# Patient Record
Sex: Female | Born: 1965 | Race: White | Hispanic: No | Smoking: Former smoker
Health system: Southern US, Community
[De-identification: ages and names within clinical notes are randomized; demographics above are authoritative.]

## PROBLEM LIST (undated history)

## (undated) DIAGNOSIS — Z87442 Personal history of urinary calculi: Secondary | ICD-10-CM

## (undated) DIAGNOSIS — G4730 Sleep apnea, unspecified: Secondary | ICD-10-CM

## (undated) DIAGNOSIS — I10 Essential (primary) hypertension: Secondary | ICD-10-CM

## (undated) DIAGNOSIS — N189 Chronic kidney disease, unspecified: Secondary | ICD-10-CM

## (undated) DIAGNOSIS — F419 Anxiety disorder, unspecified: Secondary | ICD-10-CM

## (undated) DIAGNOSIS — M1990 Unspecified osteoarthritis, unspecified site: Secondary | ICD-10-CM

## (undated) DIAGNOSIS — R519 Headache, unspecified: Secondary | ICD-10-CM

## (undated) DIAGNOSIS — R5383 Other fatigue: Secondary | ICD-10-CM

## (undated) DIAGNOSIS — R7303 Prediabetes: Secondary | ICD-10-CM

## (undated) HISTORY — PX: CHOLECYSTECTOMY: SHX55

## (undated) HISTORY — PX: OTHER SURGICAL HISTORY: SHX169

## (undated) HISTORY — DX: Anxiety disorder, unspecified: F41.9

## (undated) HISTORY — DX: Other fatigue: R53.83

## (undated) HISTORY — PX: TUBAL LIGATION: SHX77

## (undated) HISTORY — PX: LAPAROSCOPIC HYSTERECTOMY: SHX1926

## (undated) HISTORY — DX: Headache, unspecified: R51.9

## (undated) HISTORY — PX: TONSILLECTOMY: SUR1361

---

## 2021-03-14 ENCOUNTER — Ambulatory Visit (INDEPENDENT_AMBULATORY_CARE_PROVIDER_SITE_OTHER): Payer: Medicaid Other

## 2021-03-14 ENCOUNTER — Ambulatory Visit (INDEPENDENT_AMBULATORY_CARE_PROVIDER_SITE_OTHER): Payer: Medicaid Other | Admitting: Sports Medicine

## 2021-03-14 ENCOUNTER — Other Ambulatory Visit: Payer: Self-pay

## 2021-03-14 DIAGNOSIS — M7542 Impingement syndrome of left shoulder: Secondary | ICD-10-CM | POA: Diagnosis not present

## 2021-03-14 DIAGNOSIS — Z96612 Presence of left artificial shoulder joint: Secondary | ICD-10-CM | POA: Insufficient documentation

## 2021-03-14 DIAGNOSIS — M170 Bilateral primary osteoarthritis of knee: Secondary | ICD-10-CM

## 2021-03-14 NOTE — Progress Notes (Signed)
    Procedures performed today:    Procedure: Real-time Ultrasound Guided injection of the left subacromial bursa Device: Samsung HS60  Verbal informed consent obtained.  Time-out conducted.  Noted no overlying erythema, induration, or other signs of local infection.  Skin prepped in a sterile fashion.  Local anesthesia: Topical Ethyl chloride.  With sterile technique and under real time ultrasound guidance:  Noted intact cuff, 1 cc Kenalog 40, 1 cc lidocaine, 1 cc bupivacaine injected easily Completed without difficulty  Advised to call if fevers/chills, erythema, induration, drainage, or persistent bleeding.  Images permanently stored and available for review in PACS.  Impression: Technically successful ultrasound guided injection.  Independent interpretation of notes and tests performed by another provider:   None.  Brief History, Exam, Impression, and Recommendations:    Impingement syndrome, shoulder, left This is a pleasant 55 year old female referred to me by Dr. Nedra Hai at [facility] here in [place], she has had a month and a half or so pain in her left shoulder, localized over the deltoid and worse with overhead activities, positive impingement signs on exam today. Getting some x-rays, because she is having nocturnal symptoms we did a subacromial injection with ultrasound guidance today, adding aggressive formal physical therapy in [place] where she lives, return to see me in 6 weeks, MRI for surgical planning if no better.  Primary osteoarthritis of both knees [name] has bilateral knee osteoarthritis, she is post partial meniscectomy. She has had some steroid injections in the past by orthopedic surgery without significant improvement. It does not sound like she has had viscosupplementation/hyaluronic injections, at the follow-up visit we will consider getting her approved for this as a bridge to  arthroplasty.    ___________________________________________ Patricia Carrillo. Patricia Carrillo, M.D., ABFM., CAQSM. Primary Care and Sports Medicine [facility]  Adjunct Instructor of Family Medicine  [employer]

## 2021-03-14 NOTE — Assessment & Plan Note (Signed)
This is a pleasant 55 year old female referred to me by Dr. Nedra Hai at [facility] here in [place], she has had a month and a half or so pain in her left shoulder, localized over the deltoid and worse with overhead activities, positive impingement signs on exam today. Getting some x-rays, because she is having nocturnal symptoms we did a subacromial injection with ultrasound guidance today, adding aggressive formal physical therapy in [place] where she lives, return to see me in 6 weeks, MRI for surgical planning if no better.

## 2021-03-14 NOTE — Assessment & Plan Note (Signed)
Patricia Carrillo has bilateral knee osteoarthritis, she is post partial meniscectomy. She has had some steroid injections in the past by orthopedic surgery without significant improvement. It does not sound like she has had viscosupplementation/hyaluronic injections, at the follow-up visit we will consider getting her approved for this as a bridge to arthroplasty.

## 2021-03-16 ENCOUNTER — Encounter: Payer: Self-pay | Admitting: Physical Therapy

## 2021-03-16 ENCOUNTER — Other Ambulatory Visit: Payer: Self-pay

## 2021-03-16 ENCOUNTER — Ambulatory Visit: Payer: Medicaid Other | Attending: Sports Medicine | Admitting: Physical Therapy

## 2021-03-16 DIAGNOSIS — M25512 Pain in left shoulder: Secondary | ICD-10-CM | POA: Diagnosis present

## 2021-03-16 NOTE — Therapy (Signed)
[facility] [address] [place], [place], [address] Phone: [phone]   Fax:  [phone]  Physical Therapy Evaluation  Patient Details  Name: Patricia Carrillo MRN: [id number] Date of Birth: 10/20/1955 Referring Provider (PT): Rodney Langton MD   Encounter Date: 03/16/2021   PT End of Session - 03/16/21 1322    Visit Number 1    Number of Visits 6    Date for PT Re-Evaluation 05/04/21    PT Start Time 0900    PT Stop Time 0926    PT Time Calculation (min) 26 min    Activity Tolerance Patient tolerated treatment well    Behavior During Therapy [facility] for tasks assessed/performed           Past Medical History:  Diagnosis Date  . Anxiety   . Fatigue   . Headache     Past Surgical History:  Procedure Laterality Date  . CHOLECYSTECTOMY    . LAPAROSCOPIC HYSTERECTOMY    . TONSILLECTOMY    . TUBAL LIGATION      There were no vitals filed for this visit.    Subjective Assessment - 03/16/21 1317    Subjective COVID-19 screen performed prior to patient entering clinic.  The presents to the clinic today with c/o left shoulder pain that came on for no apparent reason about a month ago.  Her pain-level today is a 6/10.  She reports a recent injection was very helpful and she can now raises her shoulder over her head.  Medication and heat can also help in decreasing her pain.  Moving her "wrong way" increases her pain.  She has had pain at night and cannot sleep comfortably on her left shoulder.    Pertinent History H/o bilateral knee pain.    Patient Stated Goals Use left UE without pain.    Currently in Pain? Yes    Pain Score 6     Pain Location Shoulder    Pain Orientation Left    Pain Descriptors / Indicators Aching;Throbbing    Pain Type Acute pain    Pain Onset More than a month ago    Pain Frequency Constant    Aggravating Factors  See above.    Pain Relieving Factors See above.              [facility] PT Assessment  - 03/16/21 0001      Assessment   Medical Diagnosis Impingement syndrome of left shoulder.    Referring Provider (PT) Rodney Langton MD    Onset Date/Surgical Date --   ~one month.     Precautions   Precautions None      Restrictions   Weight Bearing Restrictions No      Balance Screen   Has the patient fallen in the past 6 months Yes    How many times? 1.    Has the patient had a decrease in activity level because of a fear of falling?  No    Is the patient reluctant to leave their home because of a fear of falling?  No      Home Environment   Living Environment Private residence      Prior Function   Level of Independence Independent      Posture/Postural Control   Posture/Postural Control Postural limitations    Postural Limitations Rounded Shoulders;Forward head      Deep Tendon Reflexes   DTR Assessment Site Biceps;Brachioradialis;Triceps    Biceps DTR 2+    Brachioradialis DTR 2+  Triceps DTR 2+      ROM / Strength   AROM / PROM / Strength AROM;Strength      AROM   Overall AROM Comments Full active left shoulder range of motion.      Strength   Overall Strength Comments Left shoulder IR/ER is 4+/5.      Palpation   Palpation comment Mild left shoulder tendernss at acromial ridge region.      Special Tests   Other special tests (-) left Drop Arm test. (-) Impigment testing likely due to successful injection.                      Objective measurements completed on examination: See above findings.                    PT Long Term Goals - 03/16/21 1342      PT LONG TERM GOAL #1   Title Independent with a HEP.    Baseline No knowledge of appropriate ther ex.    Time 6    Period Weeks    Status New      PT LONG TERM GOAL #2   Title Perform ADL's with left shoulder pain not > 2/10.    Baseline Pain will rise above a 6/10 with the performance of ADL's.    Time 6    Period Weeks    Status New      PT LONG TERM GOAL  #3   Title Sleep undisturbed for 6 hours.    Baseline Patient's sleep is disturbed due to pain.    Time 6    Period Weeks    Status New                  Plan - 03/16/21 1329    Clinical Impression Statement The patient presents to OPPT with c/o left shoulder pain that has been ongoing for about a month.  A recent was very helpful and she exhibits full left active shoulder range of motion.  Impingement testing was negative today.  She is unable to sleep on her left side.  She has a minimal loss of left shoulder RTC strength.  Patient will benefit from skilled physical therapy intervention to address pain and deficits.    Personal Factors and Comorbidities Comorbidity 1;Other    Comorbidities H/o bilateral knee pain.    Examination-Activity Limitations Other;Sleep    Examination-Participation Restrictions Other    Stability/Clinical Decision Making Stable/Uncomplicated    Clinical Decision Making Low    Rehab Potential Excellent    PT Frequency 2x / week    PT Duration 6 weeks    PT Treatment/Interventions Therapeutic activities;Therapeutic exercise;Manual techniques    PT Next Visit Plan UBE, RW4, left shoulder PRE's.  STW/M.    Consulted and Agree with Plan of Care Patient           Patient will benefit from skilled therapeutic intervention in order to improve the following deficits and impairments:  Pain,Decreased strength,Decreased activity tolerance  Visit Diagnosis: Acute pain of left shoulder - Plan: PT plan of care cert/re-cert     Problem List Patient Active Problem List   Diagnosis Date Noted  . Impingement syndrome, shoulder, left 03/14/2021  . Primary osteoarthritis of both knees 03/14/2021    Patricia Carrillo, [place] MPT 03/16/2021, 1:46 PM  [facility] [address] [place], [place], [address] Phone: [phone]   Fax:  [phone]  Name: Patricia Carrillo MRN:  [id number] Date of Birth: 10/20/1955

## 2021-03-23 ENCOUNTER — Ambulatory Visit: Payer: Medicaid Other | Admitting: Physical Therapy

## 2021-03-23 ENCOUNTER — Other Ambulatory Visit: Payer: Self-pay

## 2021-03-23 DIAGNOSIS — M25512 Pain in left shoulder: Secondary | ICD-10-CM

## 2021-03-23 NOTE — Therapy (Signed)
[facility] [address] [place], [place], [address] Phone: [phone]   Fax:  [phone]  Physical Therapy Treatment  Patient Details  Name: Patricia Carrillo MRN: [id number] Date of Birth: 10/20/1955 Referring Provider (PT): Rodney Langton MD   Encounter Date: 03/23/2021   PT End of Session - 03/23/21 0903     Visit Number 2    Number of Visits 6    Date for PT Re-Evaluation 05/04/21    PT Start Time 0900    PT Stop Time 0944    PT Time Calculation (min) 44 min    Activity Tolerance Patient tolerated treatment well    Behavior During Therapy [facility] for tasks assessed/performed             Past Medical History:  Diagnosis Date   Anxiety    Fatigue    Headache     Past Surgical History:  Procedure Laterality Date   CHOLECYSTECTOMY     LAPAROSCOPIC HYSTERECTOMY     TONSILLECTOMY     TUBAL LIGATION      There were no vitals filed for this visit.   Subjective Assessment - 03/23/21 0901     Subjective COVID-19 screen performed prior to patient entering clinic.  Patient arrived with some ongoing pain yet shot has helped    Pertinent History H/o bilateral knee pain.    Patient Stated Goals Use left UE without pain.    Currently in Pain? Yes    Pain Score 4     Pain Location Shoulder    Pain Orientation Left    Pain Descriptors / Indicators Discomfort    Pain Type Acute pain    Pain Onset More than a month ago    Pain Frequency Constant    Aggravating Factors  unsure of trigger    Pain Relieving Factors rest and ice                               [facility] Adult PT Treatment/Exercise - 03/23/21 0001       Exercises   Exercises Shoulder      Shoulder Exercises: Supine   Other Supine Exercises seated yellow band for ER with FF 2x10      Shoulder Exercises: Seated   Row Strengthening;Both;20 reps;Theraband    Theraband Level (Shoulder Row) Level 2 (Red)    Other Seated Exercises chair press  3sec 2x10    Other Seated Exercises Bil scaption "V" 2x10      Shoulder Exercises: Sidelying   External Rotation Strengthening;Left;20 reps;Weights    External Rotation Weight (lbs) 1      Shoulder Exercises: Standing   Protraction Strengthening;Left;20 reps;Theraband    Theraband Level (Shoulder Protraction) Level 1 (Yellow)    External Rotation Strengthening;Left;20 reps;Theraband    Theraband Level (Shoulder External Rotation) Level 1 (Yellow)    Internal Rotation Strengthening;Left;20 reps;Theraband    Theraband Level (Shoulder Internal Rotation) Level 1 (Yellow)    Retraction Strengthening;Left;20 reps;Theraband    Theraband Level (Shoulder Retraction) Level 1 (Yellow)      Shoulder Exercises: ROM/Strengthening   UBE (Upper Arm Bike) 6 min 120 RPM 3/3    Wall Pushups 20 reps      Shoulder Exercises: Stretch   Corner Stretch 3 reps;20 seconds    Other Shoulder Stretches ant cap stretch 20sec hold x3reps  PT Long Term Goals - 03/23/21 0903       PT LONG TERM GOAL #1   Title Independent with a HEP.    Time 6    Period Weeks    Status On-going      PT LONG TERM GOAL #2   Title Perform ADL's with left shoulder pain not > 2/10.    Baseline Pain will rise above a 6/10 with the performance of ADL's.    Time 6    Period Weeks    Status On-going      PT LONG TERM GOAL #3   Title Sleep undisturbed for 6 hours.    Baseline Patient's sleep is disturbed due to pain.    Time 6    Period Weeks    Status On-going                   Plan - 03/23/21 0943     Clinical Impression Statement Patient tolerated treatment well today. Patient able to progress with PRE's and gentle stretches to improve stability and strength. patient reported no increased pain with exercises today. Patient has reported little pain after having shot in shoulder and unsure of any triggers that increase pain. Goals ongoing today. Will cont with POC and issue  HEP next week.    Personal Factors and Comorbidities Comorbidity 1;Other    Comorbidities H/o bilateral knee pain.    Examination-Activity Limitations Other;Sleep    Examination-Participation Restrictions Other    Stability/Clinical Decision Making Stable/Uncomplicated    Rehab Potential Excellent    PT Frequency 2x / week    PT Duration 6 weeks    PT Treatment/Interventions Therapeutic activities;Therapeutic exercise;Manual techniques    PT Next Visit Plan cont with POC for left shoulder PRE's.  STW/M.    Consulted and Agree with Plan of Care Patient             Patient will benefit from skilled therapeutic intervention in order to improve the following deficits and impairments:  Pain, Decreased strength, Decreased activity tolerance  Visit Diagnosis: Acute pain of left shoulder     Problem List Patient Active Problem List   Diagnosis Date Noted   Impingement syndrome, shoulder, left 03/14/2021   Primary osteoarthritis of both knees 03/14/2021    Vila Dory P, PTA 03/23/2021, 9:48 AM  [facility] [address] [place], [place], [address] Phone: [phone]   Fax:  [phone]  Name: Patricia Carrillo MRN: [id number] Date of Birth: 10/20/1955

## 2021-03-24 ENCOUNTER — Encounter: Payer: Self-pay | Admitting: Physical Therapy

## 2021-03-24 ENCOUNTER — Ambulatory Visit: Payer: Medicaid Other | Admitting: Physical Therapy

## 2021-03-24 DIAGNOSIS — M25512 Pain in left shoulder: Secondary | ICD-10-CM | POA: Diagnosis not present

## 2021-03-24 NOTE — Therapy (Signed)
[facility] [address] [place], [place], [address] Phone: [phone]   Fax:  [phone]  Physical Therapy Treatment  Patient Details  Name: Patricia Carrillo MRN: [id number] Date of Birth: 10/20/1955 Referring Provider (PT): Rodney Langton MD   Encounter Date: 03/24/2021   PT End of Session - 03/24/21 1348     Visit Number 3    Number of Visits 6    Date for PT Re-Evaluation 05/04/21    PT Start Time 1344    PT Stop Time 1430    PT Time Calculation (min) 46 min    Activity Tolerance Patient tolerated treatment well    Behavior During Therapy [facility] for tasks assessed/performed             Past Medical History:  Diagnosis Date   Anxiety    Fatigue    Headache     Past Surgical History:  Procedure Laterality Date   CHOLECYSTECTOMY     LAPAROSCOPIC HYSTERECTOMY     TONSILLECTOMY     TUBAL LIGATION      There were no vitals filed for this visit.   Subjective Assessment - 03/24/21 1346     Subjective COVID-19 screen performed prior to patient entering clinic. Reports minimal pain 2/10.    Pertinent History H/o bilateral knee pain.    Patient Stated Goals Use left UE without pain.    Currently in Pain? Yes    Pain Score 2     Pain Location Shoulder    Pain Orientation Left    Pain Descriptors / Indicators Discomfort    Pain Type Acute pain    Pain Onset More than a month ago    Pain Frequency Constant                [facility] PT Assessment - 03/24/21 0001       Assessment   Medical Diagnosis Impingement syndrome of left shoulder.    Referring Provider (PT) Rodney Langton MD    Next MD Visit 04/2021      Precautions   Precautions None      Restrictions   Weight Bearing Restrictions No                           [facility] Adult PT Treatment/Exercise - 03/24/21 0001       Shoulder Exercises: Standing   Protraction Strengthening;Left;20 reps;Theraband    Theraband Level (Shoulder  Protraction) Level 1 (Yellow)    Horizontal ABduction Strengthening;Both;20 reps;Theraband    Theraband Level (Shoulder Horizontal ABduction) Level 1 (Yellow)    External Rotation Strengthening;Left;20 reps;Theraband    Theraband Level (Shoulder External Rotation) Level 1 (Yellow)    Internal Rotation Strengthening;Left;20 reps;Theraband    Theraband Level (Shoulder Internal Rotation) Level 1 (Yellow)    Extension Strengthening;Left;20 reps;Theraband    Theraband Level (Shoulder Extension) Level 1 (Yellow)    Row Strengthening;Left;20 reps;Theraband    Theraband Level (Shoulder Row) Level 1 (Yellow)    Diagonals Strengthening;Left;20 reps;Theraband    Theraband Level (Shoulder Diagonals) Level 1 (Yellow)    Other Standing Exercises RUE wall slides in ER x20 reps      Shoulder Exercises: ROM/Strengthening   UBE (Upper Arm Bike) 90 RPM x6 min                    PT Education - 03/24/21 1441     Education Details [id number]- RW4 yellow theraband    Person(s) Educated Patient  Methods Explanation;Handout    Comprehension Verbalized understanding                 PT Long Term Goals - 03/24/21 1423       PT LONG TERM GOAL #1   Title Independent with a HEP.    Time 6    Period Weeks    Status On-going      PT LONG TERM GOAL #2   Title Perform ADL's with left shoulder pain not > 2/10.    Baseline Pain will rise above a 6/10 with the performance of ADL's.    Time 6    Period Weeks    Status Achieved      PT LONG TERM GOAL #3   Title Sleep undisturbed for 6 hours.    Baseline Patient's sleep is disturbed due to pain.    Time 6    Period Weeks    Status Achieved   Pain not due to shoulders; knees and back pain                  Plan - 03/24/21 1442     Clinical Impression Statement Patient presented in clinic with reports of minimal shoulder pain. Patient progressed into shoulder strengthening with no complaints of pain. Patient educated with ER of  UE to reduce further impingement of shoulder. Patient denies any limitations with functional ADLs at home for shoulder. Patient is awakened in sleep by pain but due to chronic LBP and knee pain only per patient report. Patient provided HEP to progress strengthening with yellow theraband. Patient provided education regarding parameters and technique.    Personal Factors and Comorbidities Comorbidity 1;Other    Comorbidities H/o bilateral knee pain.    Examination-Activity Limitations Other;Sleep    Examination-Participation Restrictions Other    Stability/Clinical Decision Making Stable/Uncomplicated    Rehab Potential Excellent    PT Frequency 2x / week    PT Duration 6 weeks    PT Treatment/Interventions Therapeutic activities;Therapeutic exercise;Manual techniques    PT Next Visit Plan cont with POC for left shoulder PRE's.  STW/M.    PT Home Exercise Plan [id number]    Recommended Other Services `    Consulted and Agree with Plan of Care Patient             Patient will benefit from skilled therapeutic intervention in order to improve the following deficits and impairments:  Pain, Decreased strength, Decreased activity tolerance  Visit Diagnosis: Acute pain of left shoulder     Problem List Patient Active Problem List   Diagnosis Date Noted   Impingement syndrome, shoulder, left 03/14/2021   Primary osteoarthritis of both knees 03/14/2021    Marvell Fuller, PTA 03/24/2021, 2:57 PM  [facility] [address] [place], [place], [address] Phone: [phone]   Fax:  [phone]  Name: Patricia Carrillo MRN: [id number] Date of Birth: 10/20/1955

## 2021-03-28 ENCOUNTER — Encounter: Payer: Self-pay | Admitting: Physical Therapy

## 2021-03-28 ENCOUNTER — Ambulatory Visit: Payer: Medicaid Other | Admitting: Physical Therapy

## 2021-03-28 ENCOUNTER — Other Ambulatory Visit: Payer: Self-pay

## 2021-03-28 DIAGNOSIS — M25512 Pain in left shoulder: Secondary | ICD-10-CM

## 2021-03-28 NOTE — Therapy (Signed)
[facility] [place], [place], [address] Phone: [phone]   Fax:  [phone]  Physical Therapy Treatment  Patient Details  Name: Patricia Carrillo MRN: [id number] Date of Birth: 10/20/1955 Referring Provider (PT): Aundria Mems MD   Encounter Date: 03/28/2021   PT End of Session - 03/28/21 1434     Visit Number 4    Number of Visits 6    Date for PT Re-Evaluation 05/04/21    PT Start Time 8341    PT Stop Time 1522    PT Time Calculation (min) 50 min    Activity Tolerance Patient tolerated treatment well    Behavior During Therapy [facility] for tasks assessed/performed             Past Medical History:  Diagnosis Date   Anxiety    Fatigue    Headache     Past Surgical History:  Procedure Laterality Date   CHOLECYSTECTOMY     LAPAROSCOPIC HYSTERECTOMY     TONSILLECTOMY     TUBAL LIGATION      There were no vitals filed for this visit.   Subjective Assessment - 03/28/21 1433     Subjective COVID-19 screen performed prior to patient entering clinic. Reports no shoulder discomfort or dysfunction today. Overwise reporting like she felt more pain in general for unknown reason.    Pertinent History H/o bilateral knee pain.    Patient Stated Goals Use left UE without pain.    Currently in Pain? No/denies                [facility] PT Assessment - 03/28/21 0001       Assessment   Medical Diagnosis Impingement syndrome of left shoulder.    Referring Provider (PT) Aundria Mems MD    Next MD Visit 04/2021      Precautions   Precautions None                           [facility] Adult PT Treatment/Exercise - 03/28/21 0001       Shoulder Exercises: Seated   Horizontal ABduction Strengthening;Both;Theraband;20 reps    Theraband Level (Shoulder Horizontal ABduction) Level 2 (Red)    Flexion Strengthening;Left;20 reps;Weights    Flexion Weight (lbs) 2    Abduction Strengthening     ABduction Weight (lbs) 2    Diagonals Strengthening;Left;20 reps;Theraband    Theraband Level (Shoulder Diagonals) Level 2 (Red)    Other Seated Exercises L shoulder scaption 2# x20 reps      Shoulder Exercises: Standing   Protraction Strengthening;Left;20 reps;Theraband    Theraband Level (Shoulder Protraction) Level 2 (Red)    Horizontal ABduction Strengthening;Both;20 reps;Theraband    Theraband Level (Shoulder Horizontal ABduction) Level 2 (Red)    External Rotation Strengthening;Left;20 reps;Theraband    Theraband Level (Shoulder External Rotation) Level 2 (Red)    Internal Rotation Strengthening;Left;20 reps;Theraband    Theraband Level (Shoulder Internal Rotation) Level 2 (Red)    Row Strengthening;Left;20 reps;Theraband    Theraband Level (Shoulder Row) Level 2 (Red)      Shoulder Exercises: ROM/Strengthening   UBE (Upper Arm Bike) 90 RPM x6 min                    PT Education - 03/28/21 1526     Education Details TENS unit education    Person(s) Educated Patient    Methods Explanation;Demonstration    Comprehension Verbalized understanding  PT Long Term Goals - 03/28/21 1507       PT LONG TERM GOAL #1   Title Independent with a HEP.    Time 6    Period Weeks    Status Achieved      PT LONG TERM GOAL #2   Title Perform ADL's with left shoulder pain not > 2/10.    Baseline Pain will rise above a 6/10 with the performance of ADL's.    Time 6    Period Weeks    Status Achieved      PT LONG TERM GOAL #3   Title Sleep undisturbed for 6 hours.    Baseline Patient's sleep is disturbed due to pain.    Time 6    Period Weeks    Status Achieved   Pain not due to shoulders; knees and back pain                  Plan - 03/28/21 1527     Clinical Impression Statement Patient presented in clinic with no complaints of any L shoulder pain. Patient does report other chronic pain in knees especially. Patient guided through  progressive strengthening with resistance. Patient denies any functional limitations due to L shoulder. Patient able to achieve all goals set at evaluation. Patient encouraged to continue HEP and educated regarding proper use of her TENS unit for parameters.    Personal Factors and Comorbidities Comorbidity 1;Other    Comorbidities H/o bilateral knee pain.    Examination-Activity Limitations Other;Sleep    Examination-Participation Restrictions Other    Stability/Clinical Decision Making Stable/Uncomplicated    Rehab Potential Excellent    PT Frequency 2x / week    PT Duration 6 weeks    PT Treatment/Interventions Therapeutic activities;Therapeutic exercise;Manual techniques    PT Next Visit Plan D/C    PT Home Exercise Plan [id number]    Consulted and Agree with Plan of Care Patient             Patient will benefit from skilled therapeutic intervention in order to improve the following deficits and impairments:  Pain, Decreased strength, Decreased activity tolerance  Visit Diagnosis: Acute pain of left shoulder     Problem List Patient Active Problem List   Diagnosis Date Noted   Impingement syndrome, shoulder, left 03/14/2021   Primary osteoarthritis of both knees 03/14/2021    Kelsey P Kennon, PTA 03/28/21 3:38 PM   [facility] [address] [place], [place], [address] Phone: [phone]   Fax:  [phone]  Name: Patricia Carrillo MRN: [id number] Date of Birth: 10/20/1955   PHYSICAL THERAPY DISCHARGE SUMMARY  Visits from Start of Care: 4.  Current functional level related to goals / functional outcomes: See above.   Remaining deficits: All goals met.   Education / Equipment: HEP.   Patient agrees to discharge. Patient goals were met. Patient is being discharged due to meeting the stated rehab goals.    Chad Applegate MPT   

## 2021-03-31 ENCOUNTER — Encounter: Payer: Medicaid Other | Admitting: Physical Therapy

## 2021-04-26 ENCOUNTER — Ambulatory Visit: Payer: Medicaid Other | Admitting: Sports Medicine

## 2021-07-25 ENCOUNTER — Encounter: Payer: Self-pay | Admitting: Internal Medicine

## 2021-10-19 ENCOUNTER — Ambulatory Visit (INDEPENDENT_AMBULATORY_CARE_PROVIDER_SITE_OTHER): Payer: Medicaid Other

## 2021-10-19 ENCOUNTER — Other Ambulatory Visit: Payer: Self-pay

## 2021-10-19 ENCOUNTER — Ambulatory Visit: Payer: Medicaid Other | Admitting: Sports Medicine

## 2021-10-19 DIAGNOSIS — M7542 Impingement syndrome of left shoulder: Secondary | ICD-10-CM

## 2021-10-19 NOTE — Progress Notes (Signed)
° ° °  Procedures performed today:    Procedure: Real-time Ultrasound Guided injection of the left subacromial bursa  device: Samsung HS60  Verbal informed consent obtained.  Time-out conducted.  Noted no overlying erythema, induration, or other signs of local infection.  Skin prepped in a sterile fashion.  Local anesthesia: Topical Ethyl chloride.  With sterile technique and under real time ultrasound guidance: Noted intact rotator cuff, 1 cc Kenalog 40, 1 cc lidocaine, 1 cc bupivacaine injected easily Completed without difficulty  Advised to call if fevers/chills, erythema, induration, drainage, or persistent bleeding.  Images permanently stored and available for review in PACS.  Impression: Technically successful ultrasound guided injection.  Independent interpretation of notes and tests performed by another provider:   None.  Brief History, Exam, Impression, and Recommendations:    Impingement syndrome, shoulder, left Pleasant 56 year old female, we saw her about 7 months ago for left shoulder impingement syndrome, she had failed some conservative treatment so we did a subacromial injection with ultrasound guidance, she had 100% pain relief. Now 6 months later she has not really been doing much of her conditioning, and is having recurrence of pain, impingement signs on exam. Repeat subacromial injection, and I did advise her to continue the conditioning indefinitely.  Chronic process with exacerbation and pharmacologic intervention  ___________________________________________ Gwen Her. Dianah Field, M.D., ABFM., CAQSM. Primary Care and [place] Instructor of [place] of [facility] of Medicine

## 2021-10-19 NOTE — Assessment & Plan Note (Signed)
Pleasant 56 year old female, we saw her about 7 months ago for left shoulder impingement syndrome, she had failed some conservative treatment so we did a subacromial injection with ultrasound guidance, she had 100% pain relief. Now 6 months later she has not really been doing much of her conditioning, and is having recurrence of pain, impingement signs on exam. Repeat subacromial injection, and I did advise her to continue the conditioning indefinitely.

## 2021-11-30 ENCOUNTER — Other Ambulatory Visit: Payer: Self-pay

## 2021-11-30 ENCOUNTER — Ambulatory Visit (INDEPENDENT_AMBULATORY_CARE_PROVIDER_SITE_OTHER): Payer: Medicaid Other

## 2021-11-30 ENCOUNTER — Ambulatory Visit: Payer: Medicaid Other | Admitting: Sports Medicine

## 2021-11-30 DIAGNOSIS — M5136 Other intervertebral disc degeneration, lumbar region: Secondary | ICD-10-CM

## 2021-11-30 DIAGNOSIS — M51369 Other intervertebral disc degeneration, lumbar region without mention of lumbar back pain or lower extremity pain: Secondary | ICD-10-CM | POA: Insufficient documentation

## 2021-11-30 DIAGNOSIS — M7542 Impingement syndrome of left shoulder: Secondary | ICD-10-CM | POA: Diagnosis not present

## 2021-11-30 IMAGING — DX DG LUMBAR SPINE COMPLETE 4+V
5 series · 5 of 5 positions shown · non-contrast
Comparison: None.

CLINICAL DATA: Chronic back pain.  No known injury.

EXAM:
LUMBAR SPINE - COMPLETE 4+ VIEW

[l-spine ap]
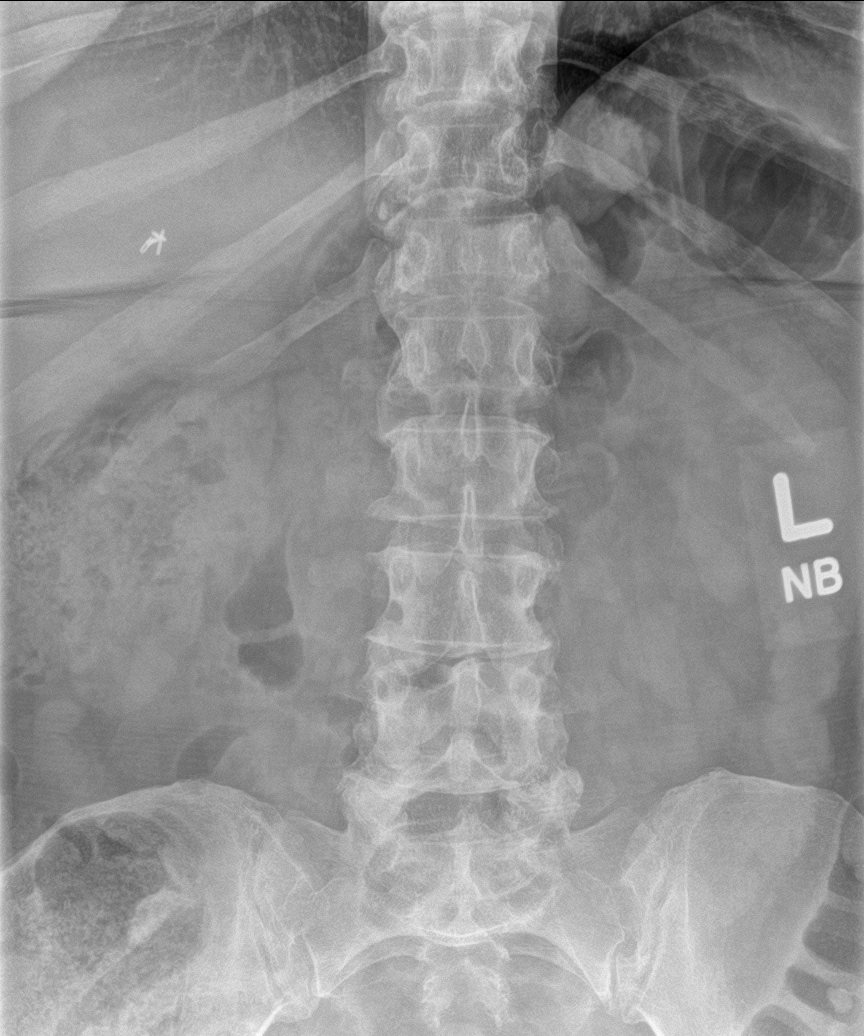

[l-spine obl (1 of 2)]
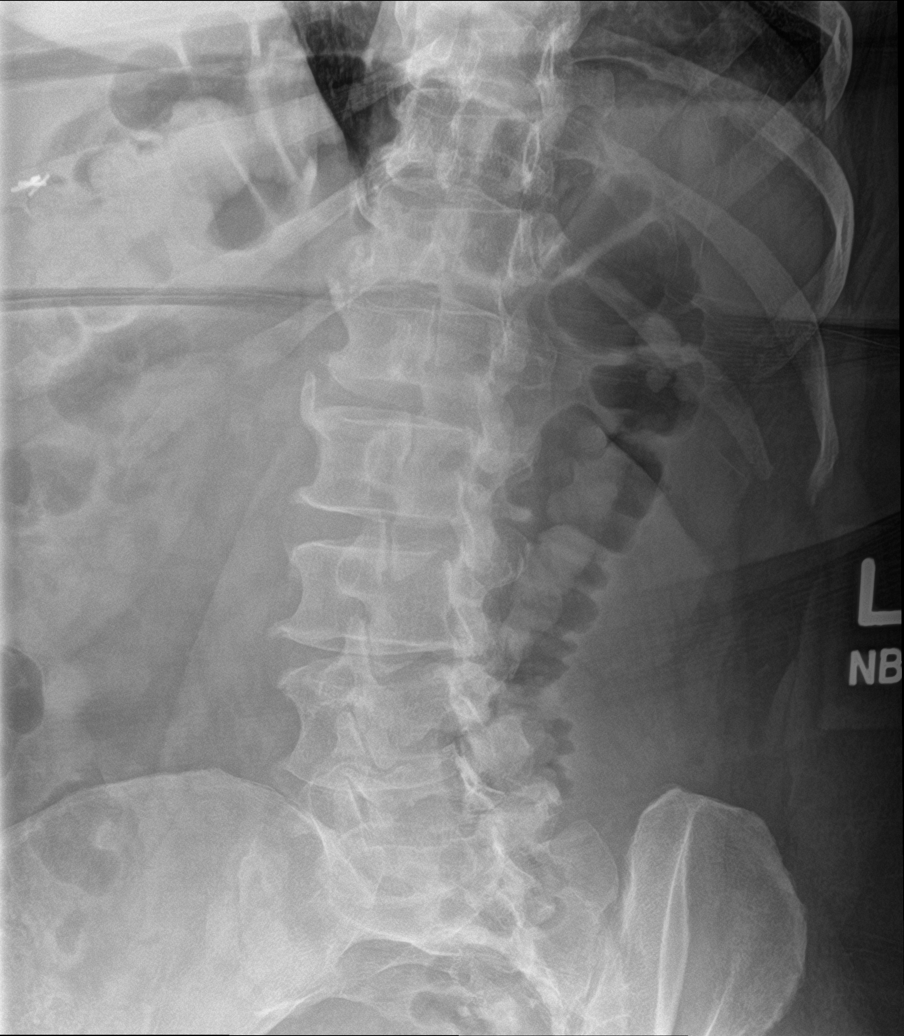

[l-spine obl (2 of 2)]
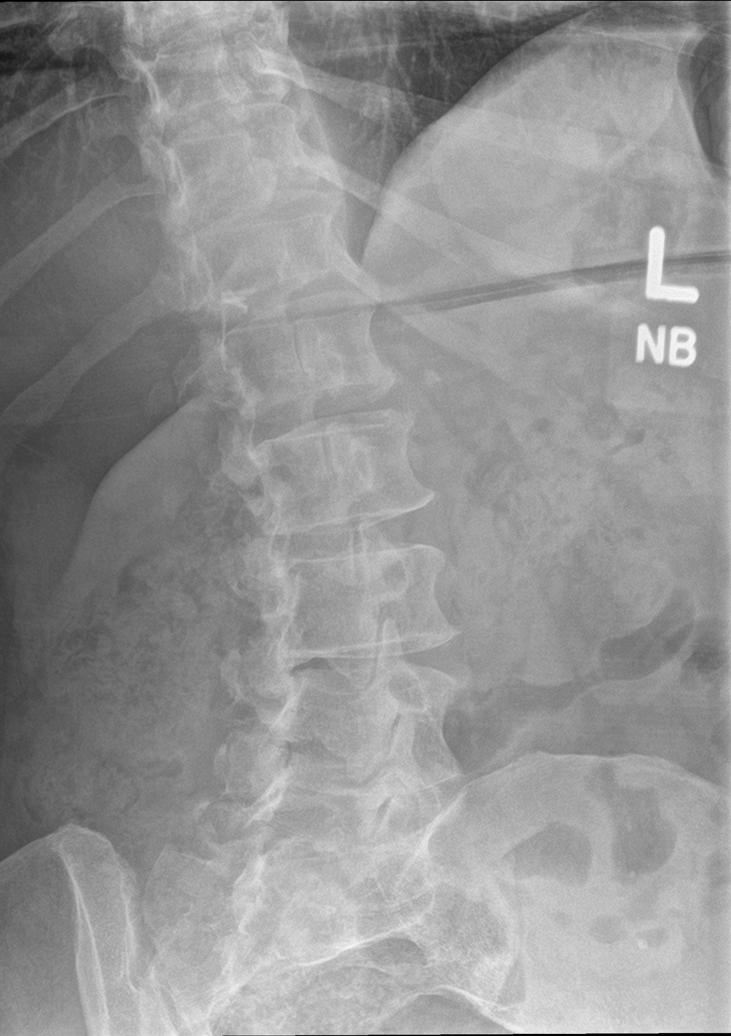

[l-spine lat]
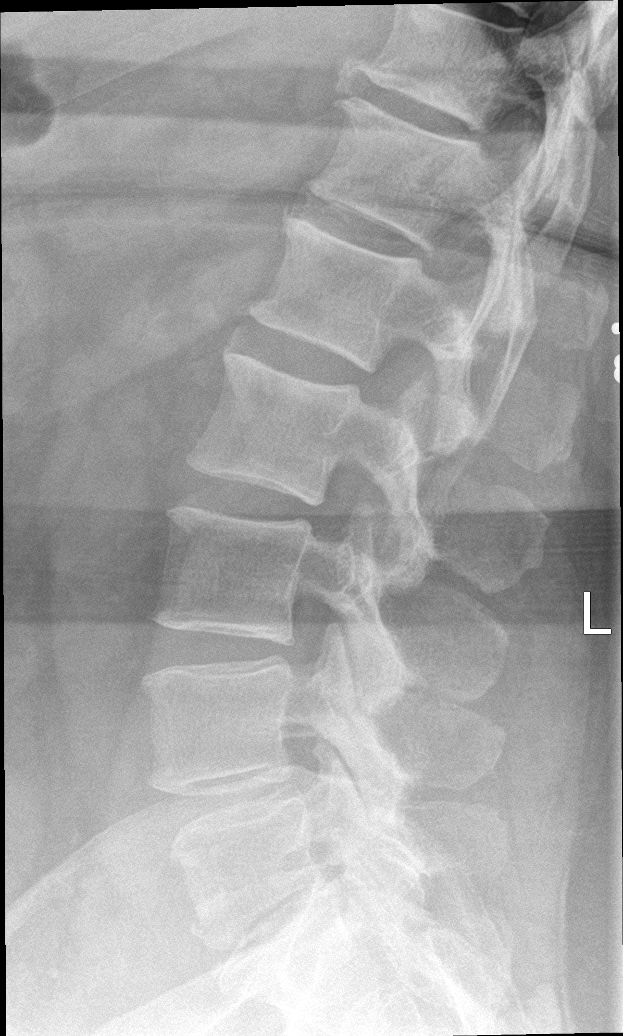

[l-spine spot]
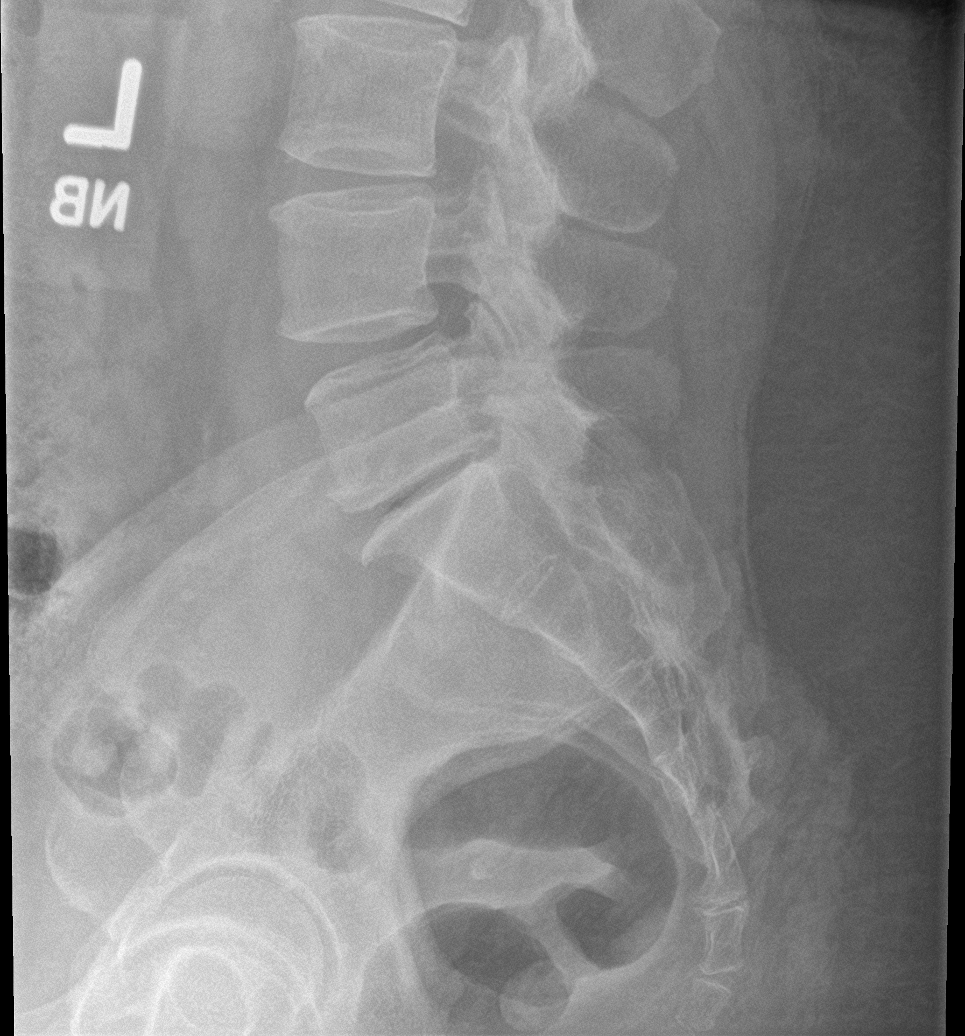

[5 of 5 positions shown; findings below may reference images not displayed]

FINDINGS: Five lumbar type vertebra. There is no acute fracture or subluxation
of the lumbar spine. There is degenerative changes with disc space
narrowing, disc desiccation and spurring at L5-S1. Mild spurring of
the anterior superior endplates at L1, L2, and L3. The visualized
posterior elements are intact. Mild lower lumbar facet arthropathy.
The soft tissues are unremarkable. Right upper quadrant
cholecystectomy clips.
IMPRESSION: 1. No acute findings.
2. Degenerative changes primarily at L5-S1.

## 2021-11-30 MED ORDER — CYCLOBENZAPRINE HCL 10 MG PO TABS: ORAL_TABLET | ORAL | 0 refills | Status: DC

## 2021-11-30 MED ORDER — PREDNISONE 50 MG PO TABS
ORAL_TABLET | ORAL | 0 refills | Status: DC
Start: 2021-11-30 — End: 2022-01-16

## 2021-11-30 NOTE — Progress Notes (Addendum)
° ° °  Procedures performed today:    None.  Independent interpretation of notes and tests performed by another provider:   None.  Brief History, Exam, Impression, and Recommendations:    Impingement syndrome, shoulder, left This pleasant 56 year old female returns, we did a subacromial injection at the last visit, she really did not get the same relief this time that she had previously. She has been consistent with the home conditioning exercises for greater than 6 weeks. She will get her x-ray today, and we will proceed with a left shoulder MRI.  Update: MRI shows cuff tearing, acromioclavicular osteoarthritis, I do suspect she needs arthroscopic debridement, referral to Dr. Griffin Basil.  Lumbar degenerative disc disease She also has longstanding low back pain, axial, discogenic with radiation down both legs below the knees to the feet and toes, she will be cognizant over the next 6 weeks as to exactly which toes it goes to and this will help me to determine which nerve is pinched in her back. No red flag symptoms, we will start with 5 days of prednisone, Flexeril, discontinue methocarbamol. X-rays. Home physical therapy. Return to see me in 6 weeks. She does have a pain management provider who does her oxycodone.    ___________________________________________ Gwen Her. Dianah Field, M.D., ABFM., CAQSM. Primary Care and [place] Instructor of [place] of [facility] of Medicine

## 2021-11-30 NOTE — Assessment & Plan Note (Addendum)
She also has longstanding low back pain, axial, discogenic with radiation down both legs below the knees to the feet and toes, she will be cognizant over the next 6 weeks as to exactly which toes it goes to and this will help me to determine which nerve is pinched in her back. No red flag symptoms, we will start with 5 days of prednisone, Flexeril, discontinue methocarbamol. X-rays. Home physical therapy. Return to see me in 6 weeks. She does have a pain management provider who does her oxycodone.

## 2021-11-30 NOTE — Assessment & Plan Note (Addendum)
This pleasant 56 year old female returns, we did a subacromial injection at the last visit, she really did not get the same relief this time that she had previously. She has been consistent with the home conditioning exercises for greater than 6 weeks. She will get her x-ray today, and we will proceed with a left shoulder MRI.  Update: MRI shows cuff tearing, acromioclavicular osteoarthritis, I do suspect she needs arthroscopic debridement, referral to Dr. Everardo Pacific.

## 2021-12-10 ENCOUNTER — Other Ambulatory Visit: Payer: Self-pay

## 2021-12-10 ENCOUNTER — Ambulatory Visit (INDEPENDENT_AMBULATORY_CARE_PROVIDER_SITE_OTHER): Payer: Medicaid Other

## 2021-12-10 DIAGNOSIS — M7542 Impingement syndrome of left shoulder: Secondary | ICD-10-CM | POA: Diagnosis not present

## 2021-12-10 IMAGING — MR MR SHOULDER*L* W/O CM
6 series · 40 of 40 positions shown · non-contrast
Comparison: Radiograph dated [DATE].

CLINICAL DATA: Shoulder pain, rotator cuff disorder suspected.

EXAM:
MRI OF THE LEFT SHOULDER WITHOUT CONTRAST
TECHNIQUE: Multiplanar, multisequence MR imaging of the shoulder was performed.
No intravenous contrast was administered.

[Series 4: T2 fat-sat · oblique · 4.0mm · 0.55mm/px · 7 of 20 slices shown (1 of 3)]
[im 1/20]
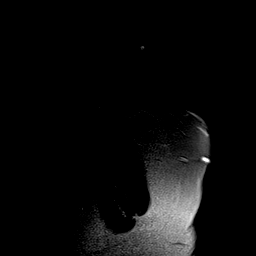
[im 4/20]
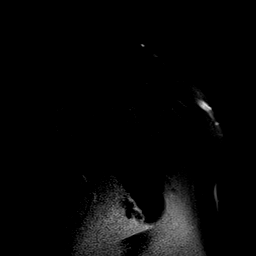
[im 7/20]
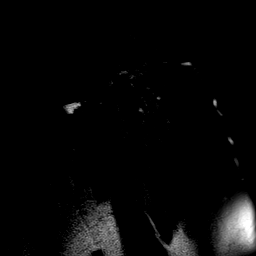
[im 10/20]
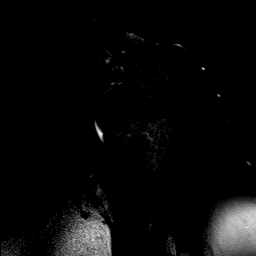
[im 13/20]
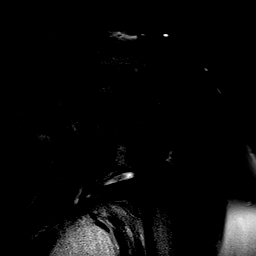
[im 16/20]
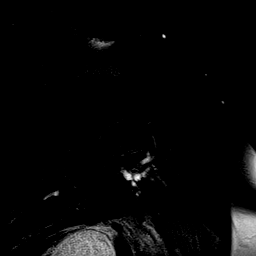
[im 20/20]
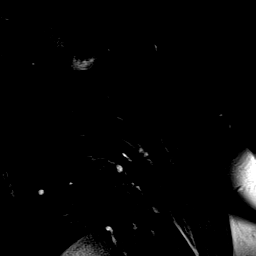

[Series 5: T2 fat-sat · axial · 4.0mm · 0.59mm/px · z∈[-97,-2]mm · 7 of 21 slices shown (2 of 3)]
[im 1/21]
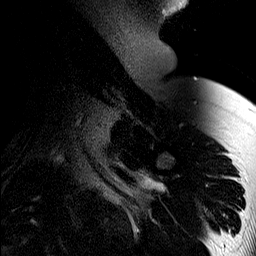
[im 4/21]
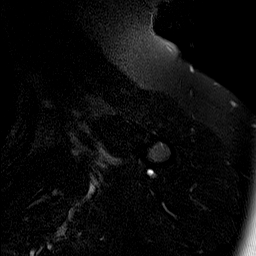
[im 7/21]
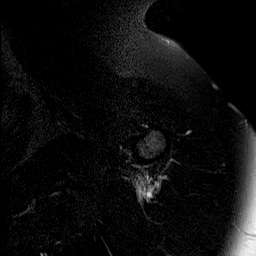
[im 11/21]
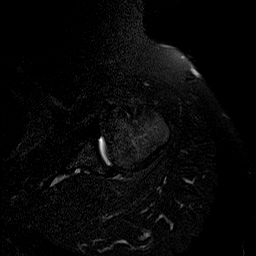
[im 14/21]
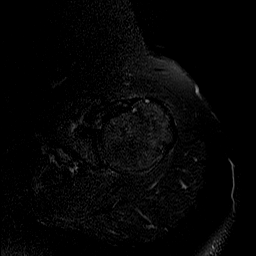
[im 17/21]
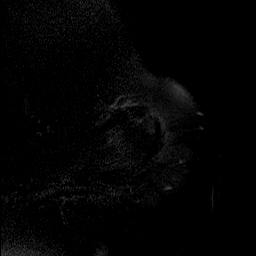
[im 21/21]
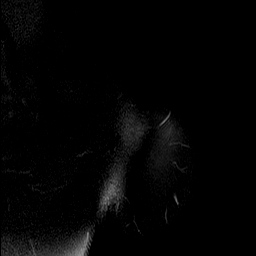

[Series 6: PD · oblique · 4.0mm · 0.55mm/px · 7 of 20 slices shown]
[im 1/20]
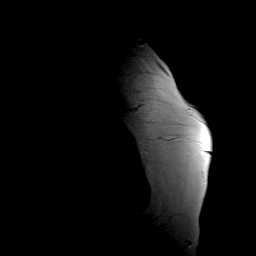
[im 4/20]
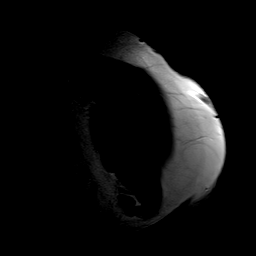
[im 7/20]
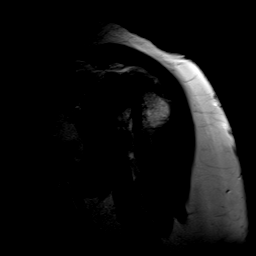
[im 10/20]
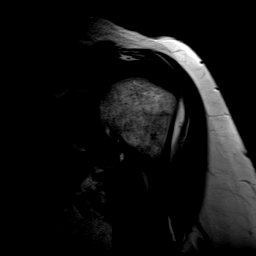
[im 13/20]
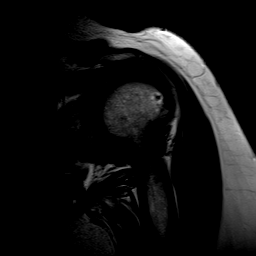
[im 16/20]
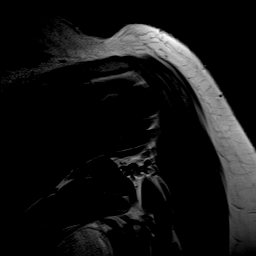
[im 20/20]
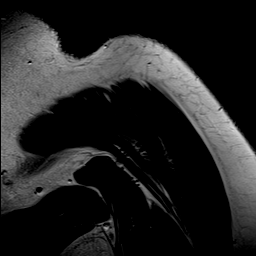

[Series 7: T1 · oblique · 4.0mm · 0.55mm/px · 6 of 18 slices shown]
[im 1/18]
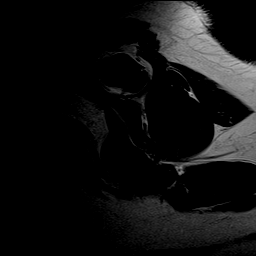
[im 4/18]
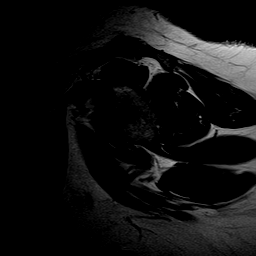
[im 7/18]
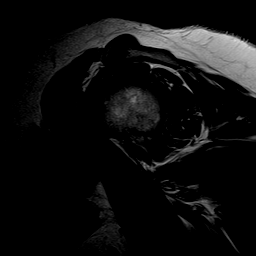
[im 11/18]
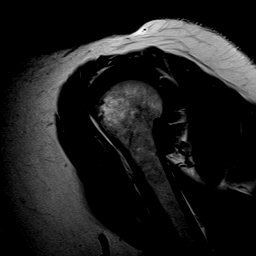
[im 14/18]
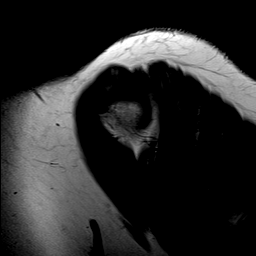
[im 18/18]
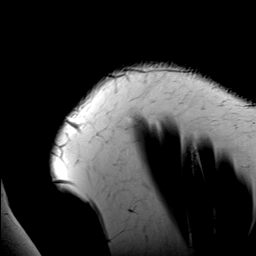

[Series 8: T2 fat-sat · oblique · 4.0mm · 0.59mm/px · 6 of 18 slices shown (3 of 3)]
[im 1/18]
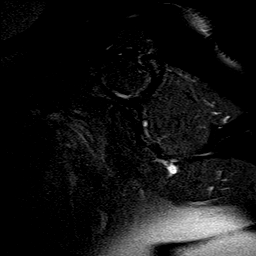
[im 4/18]
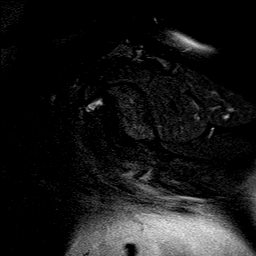
[im 7/18]
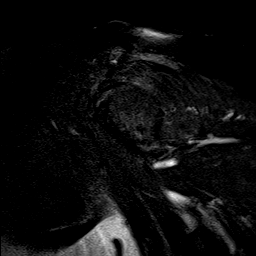
[im 11/18]
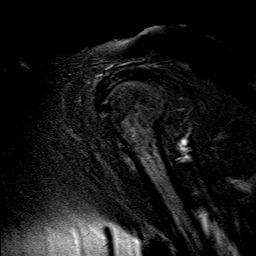
[im 14/18]
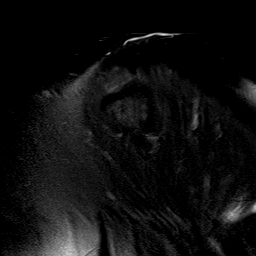
[im 18/18]
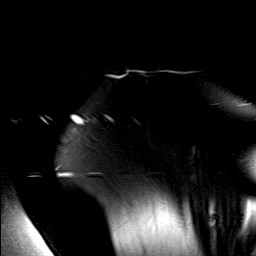

[Series 9: STIR · oblique · 4.0mm · 0.27mm/px · 7 of 20 slices shown]
[im 1/20]
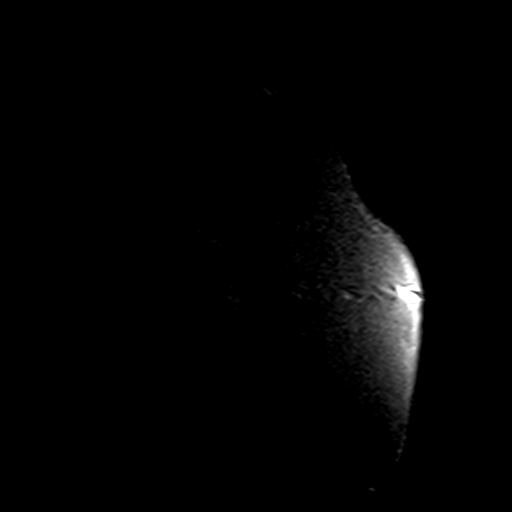
[im 4/20]
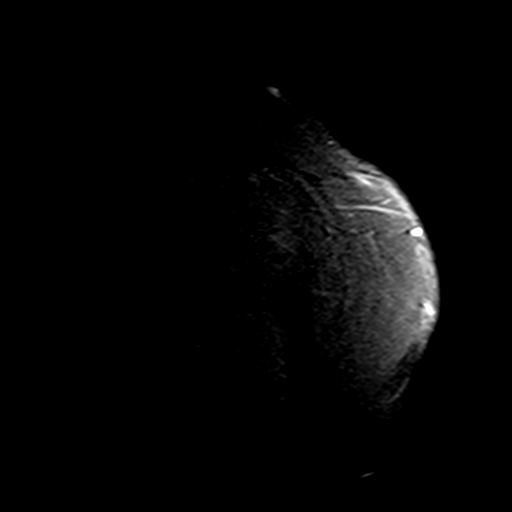
[im 7/20]
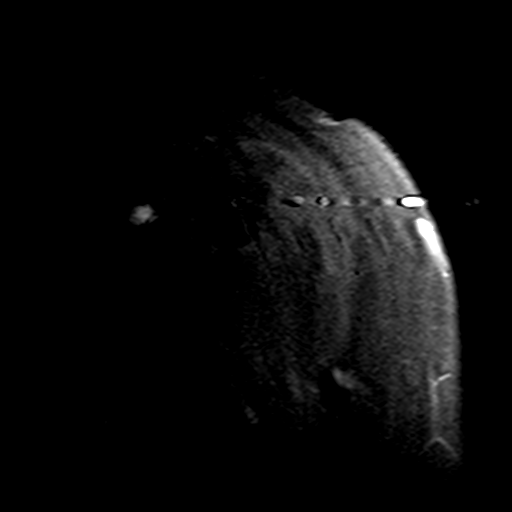
[im 10/20]
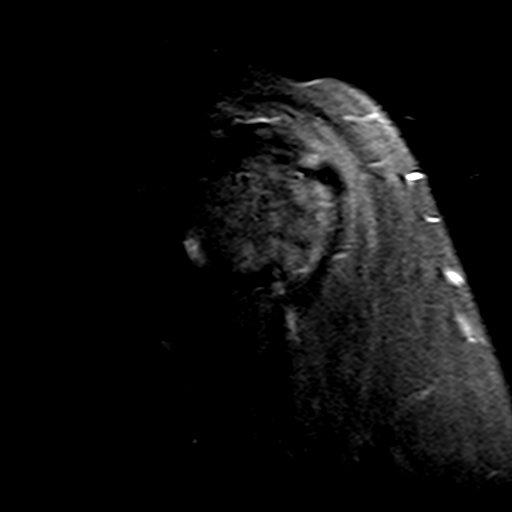
[im 13/20]
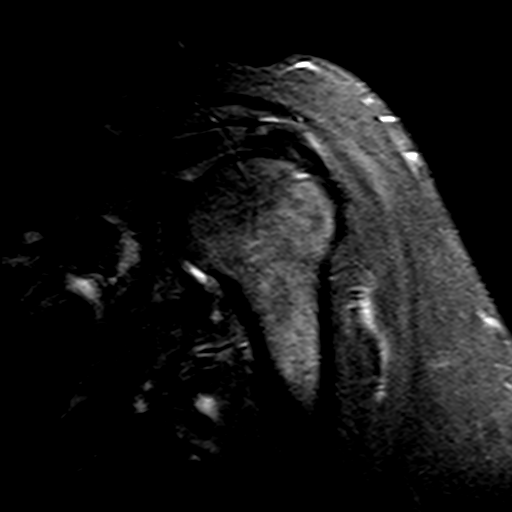
[im 16/20]
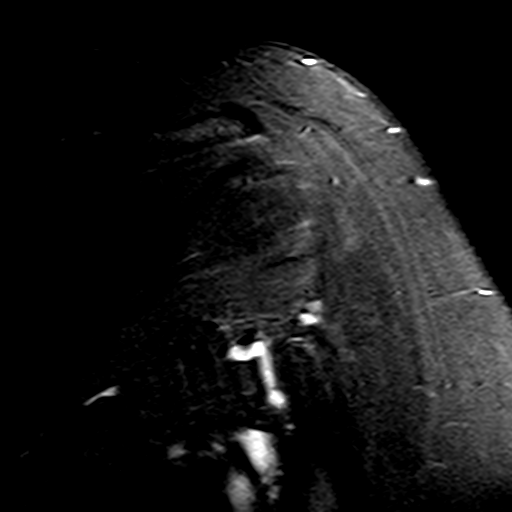
[im 20/20]
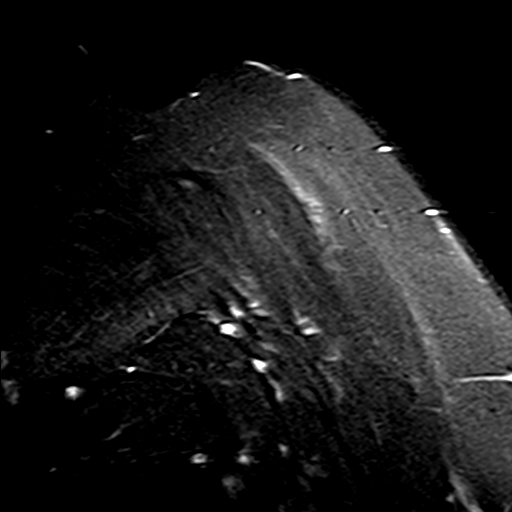

[40 of 40 positions shown; findings below may reference images not displayed]

FINDINGS: Multiple sequences are degraded due to motion.

Rotator cuff: Intermediate signal of the supraspinatus tendon
concerning for tendinopathy. Focal partial-thickness bursal surface
tear of the distal supraspinatus tendon (series 4, image 12).
Infraspinatus tendon is intact. Teres minor tendon is intact.
Subscapularis tendon is intact.

Muscles: No muscle atrophy or edema. No intramuscular fluid
collection or hematoma.

Biceps Long Head: Intraarticular and extraarticular portions of the
biceps tendon are intact.

Acromioclavicular Joint: Moderate arthropathy of the
acromioclavicular joint characterized by joint capsule thickening
and subacromial osteophytes. No subacromial/subdeltoid bursal fluid.

Glenohumeral Joint: No joint effusion. No chondral defect.

Labrum: Grossly intact, but evaluation is limited by lack of
intraarticular fluid/contrast.

Bones: No fracture or dislocation. No aggressive osseous lesion.

Other: No fluid collection or hematoma.
IMPRESSION: 1. Focal partial-thickness bursal surface tear of the supraspinatus
tendon near the footprint in the background of tendinopathy.

2. Moderate acromioclavicular osteoarthritis with subacromial
osteophytes.

3. No appreciable joint effusion of glenohumeral joint or
full-thickness cartilage defect. Evaluation is however suboptimal
due to motion.

4.  No evidence of fracture or dislocation.

## 2021-12-12 NOTE — Addendum Note (Signed)
Addended by: Silverio Decamp on: 12/12/2021 10:45 AM   Modules accepted: Orders

## 2021-12-14 ENCOUNTER — Ambulatory Visit: Payer: Medicaid Other | Admitting: Gastroenterology

## 2021-12-14 ENCOUNTER — Encounter: Payer: Self-pay | Admitting: Internal Medicine

## 2022-01-16 ENCOUNTER — Other Ambulatory Visit: Payer: Self-pay

## 2022-01-16 ENCOUNTER — Encounter (HOSPITAL_BASED_OUTPATIENT_CLINIC_OR_DEPARTMENT_OTHER): Payer: Self-pay | Admitting: Orthopaedic Surgery

## 2022-01-16 ENCOUNTER — Ambulatory Visit: Payer: Medicaid Other | Admitting: Sports Medicine

## 2022-01-16 DIAGNOSIS — M5136 Other intervertebral disc degeneration, lumbar region: Secondary | ICD-10-CM

## 2022-01-16 DIAGNOSIS — M7542 Impingement syndrome of left shoulder: Secondary | ICD-10-CM | POA: Diagnosis not present

## 2022-01-16 DIAGNOSIS — E669 Obesity, unspecified: Secondary | ICD-10-CM

## 2022-01-16 DIAGNOSIS — M51369 Other intervertebral disc degeneration, lumbar region without mention of lumbar back pain or lower extremity pain: Secondary | ICD-10-CM

## 2022-01-16 HISTORY — DX: Obesity, unspecified: E66.9

## 2022-01-16 MED ORDER — GABAPENTIN 800 MG PO TABS: 800.0000 mg | ORAL_TABLET | Freq: Two times a day (BID) | ORAL | 3 refills | Status: DC

## 2022-01-16 MED ORDER — TRIAZOLAM 0.25 MG PO TABS: ORAL_TABLET | ORAL | 0 refills | Status: DC

## 2022-01-16 NOTE — Progress Notes (Signed)
Pt reports she had a stress test today at [facility]. She will be seeing Dr Shanda Howells tomorrow to review results. She will ask them to fax the info to [place]. ?

## 2022-01-16 NOTE — Assessment & Plan Note (Signed)
See prior notes, scheduled for shoulder arthroscopy with Dr. Griffin Basil. ?

## 2022-01-16 NOTE — Progress Notes (Addendum)
? ? ?  Procedures performed today:   ? ?None. ? ?Independent interpretation of notes and tests performed by another provider:  ? ?None. ? ?Brief History, Exam, Impression, and Recommendations:   ? ?Lumbar degenerative disc disease ?Pleasant 56 year old female, longstanding low back pain, axial pain is discogenic, radicular pain down the left leg is in an L5 distribution. ?She has now had greater than 6 weeks of physician directed conservative treatment, initial treatment was February. ?She has failed 5 days of prednisone, Flexeril. ?X-rays showed L5-S1 DDD, proceeding with MRI with triazolam preprocedural anxiolysis, most recent creatinine in March was 1.2, we will bump her gabapentin up to 800 twice daily. ?As before she does have a pain management provider who does her oxycodone. ?Ultimately the plan will be to try the gabapentin and have her do some therapy for her back while she does her shoulder therapy before considering an epidural. ? ?Impingement syndrome, shoulder, left ?See prior notes, scheduled for shoulder arthroscopy with Dr. Everardo Pacific. ? ? ? ?___________________________________________ ?Ihor Austin. Benjamin Stain, M.D., ABFM., CAQSM. ?Primary Care and Sports Medicine ?[facility] ? ?Adjunct Instructor of Family Medicine  ?[employer] ?

## 2022-01-16 NOTE — Assessment & Plan Note (Addendum)
Pleasant 56 year old female, longstanding low back pain, axial pain is discogenic, radicular pain down the left leg is in an L5 distribution. ?She has now had greater than 6 weeks of physician directed conservative treatment, initial treatment was February. ?She has failed 5 days of prednisone, Flexeril. ?X-rays showed L5-S1 DDD, proceeding with MRI with triazolam preprocedural anxiolysis, most recent creatinine in March was 1.2, we will bump her gabapentin up to 800 twice daily. ?As before she does have a pain management provider who does her oxycodone. ?Ultimately the plan will be to try the gabapentin and have her do some therapy for her back while she does her shoulder therapy before considering an epidural. ?

## 2022-01-21 ENCOUNTER — Ambulatory Visit (INDEPENDENT_AMBULATORY_CARE_PROVIDER_SITE_OTHER): Payer: Medicaid Other

## 2022-01-21 DIAGNOSIS — M545 Low back pain, unspecified: Secondary | ICD-10-CM

## 2022-01-21 DIAGNOSIS — M5136 Other intervertebral disc degeneration, lumbar region: Secondary | ICD-10-CM | POA: Diagnosis not present

## 2022-01-21 IMAGING — MR MR LUMBAR SPINE W/O CM
4 of 5 series · 25 of 48 positions shown · non-contrast
Comparison: Radiograph from [DATE].

CLINICAL DATA: Initial evaluation for lower back pain with left
lumbar radiculitis.

EXAM:
MRI LUMBAR SPINE WITHOUT CONTRAST
TECHNIQUE: Multiplanar, multisequence MR imaging of the lumbar spine was
performed. No intravenous contrast was administered.

[Series 2: T2 · sagittal · 4.0mm · 0.81mm/px · 6 of 15 slices shown (1 of 2)]
[im 1/15]
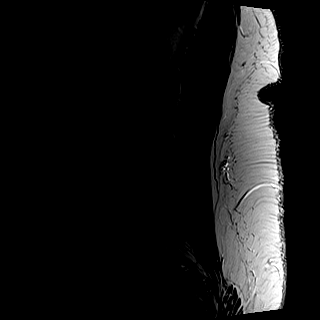
[im 3/15]
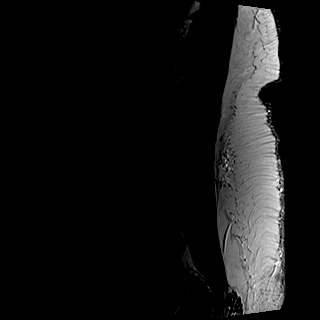
[im 6/15]
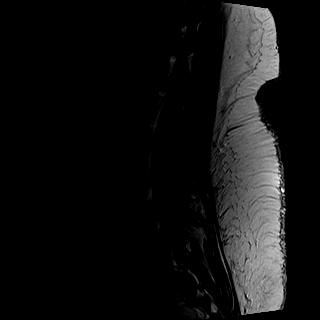
[im 9/15]
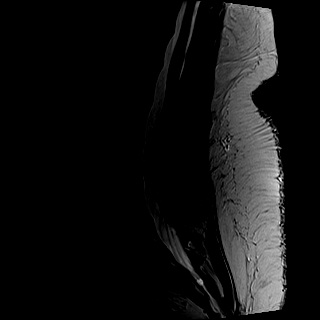
[im 12/15]
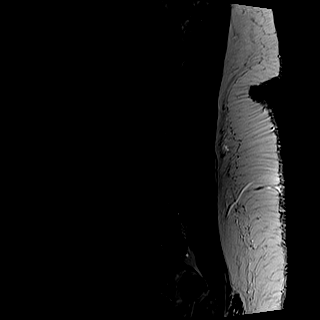
[im 15/15]
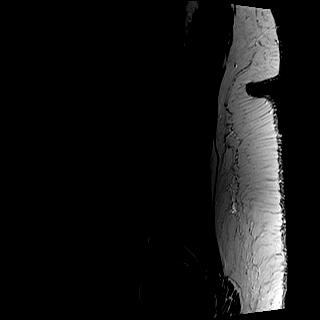

[Series 3: T1 · sagittal · 4.0mm · 0.41mm/px · 6 of 15 slices shown (1 of 2)]
[im 1/15]
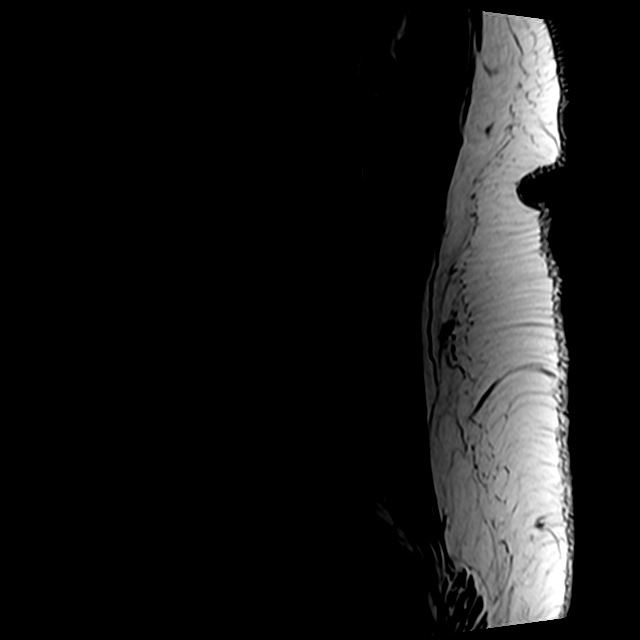
[im 3/15]
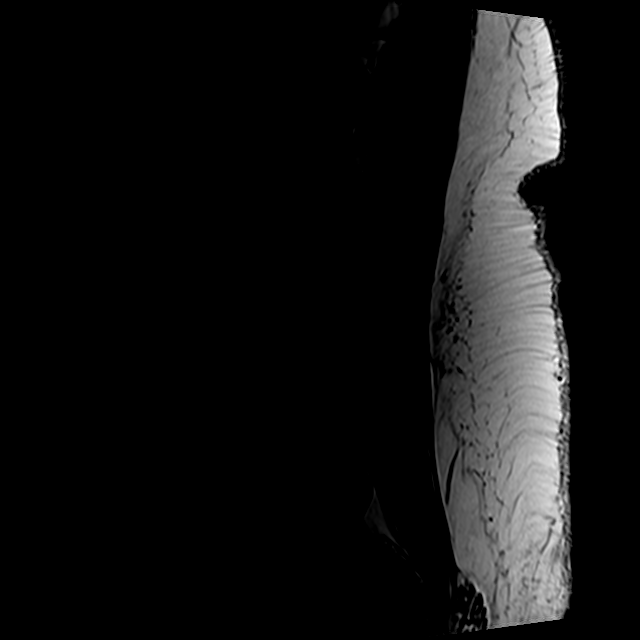
[im 6/15]
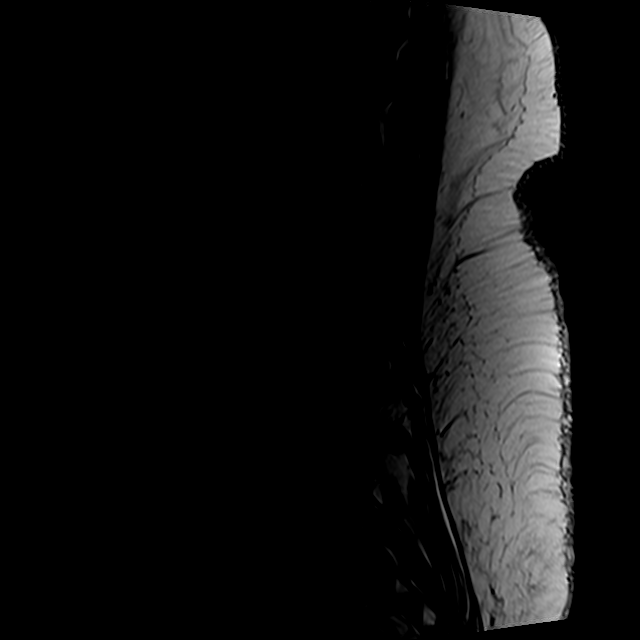
[im 9/15]
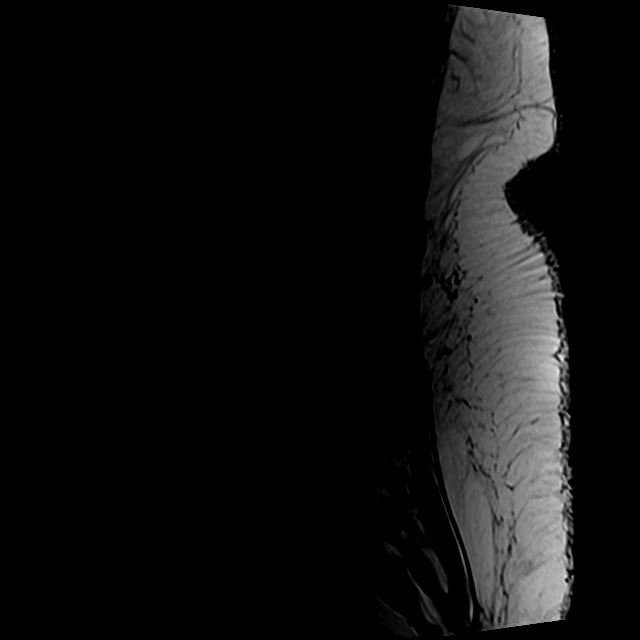
[im 12/15]
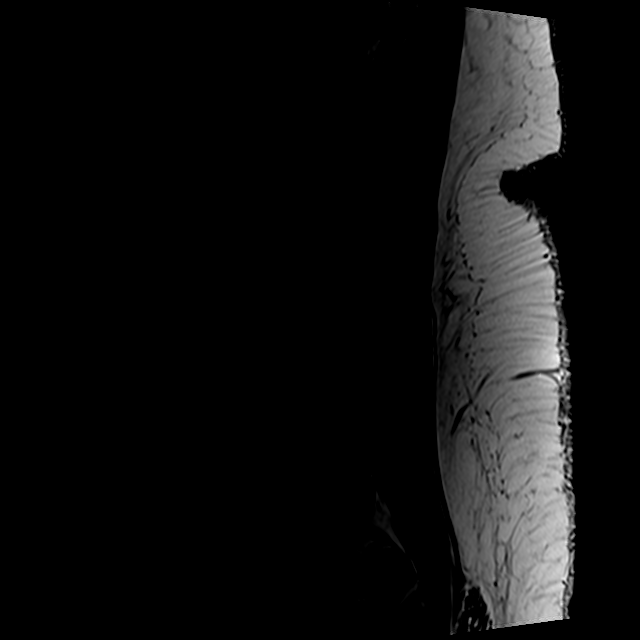
[im 15/15]
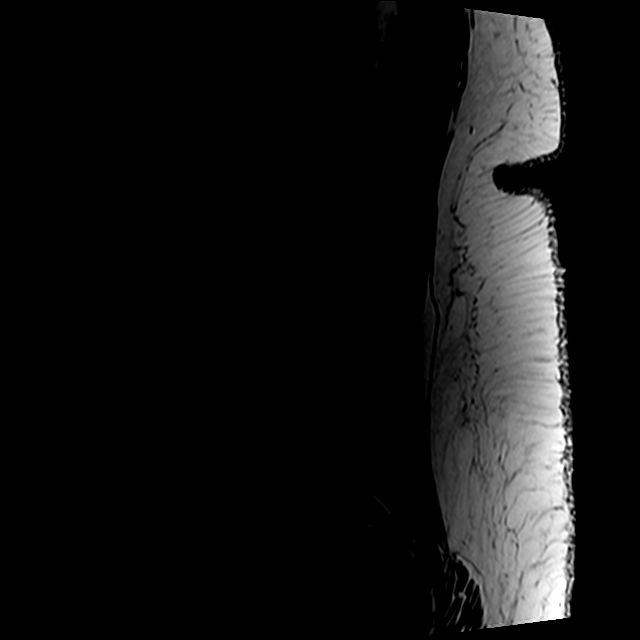

[Series 5: T2 · axial · 4.0mm · 0.78mm/px · z∈[-91,+120]mm · 9 of 39 slices shown (2 of 2)]
[im 1/39]
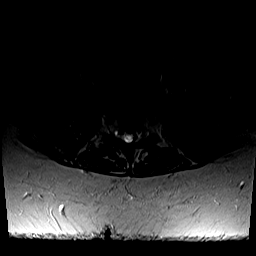
[im 6/39]
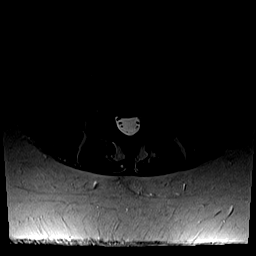
[im 11/39]
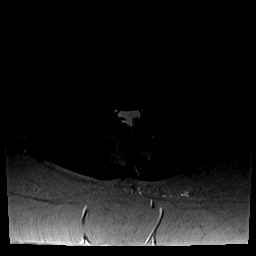
[im 17/39]
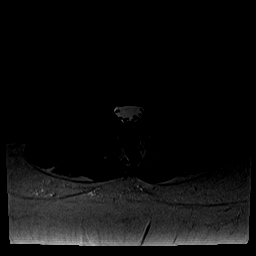
[im 20/39]
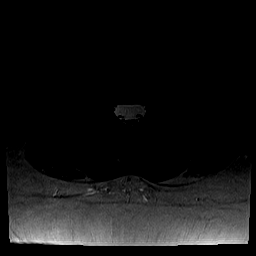
[im 22/39]
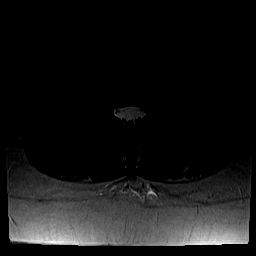
[im 28/39]
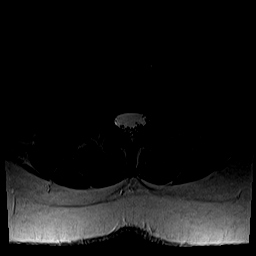
[im 33/39]
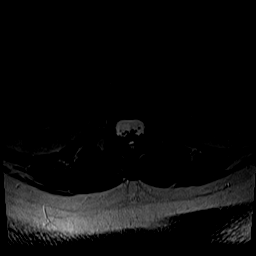
[im 39/39]
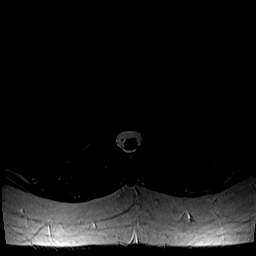

[Series 6: T1 · axial · 4.0mm · 0.39mm/px · z∈[-91,+90]mm · 4 of 39 slices shown (2 of 2)]
[im 1/39]
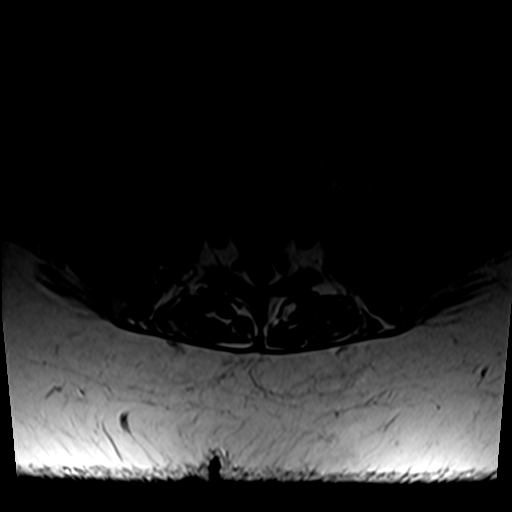
[im 6/39]
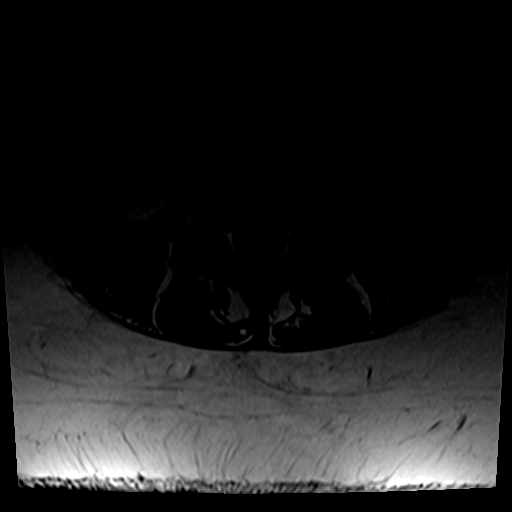
[im 20/39]
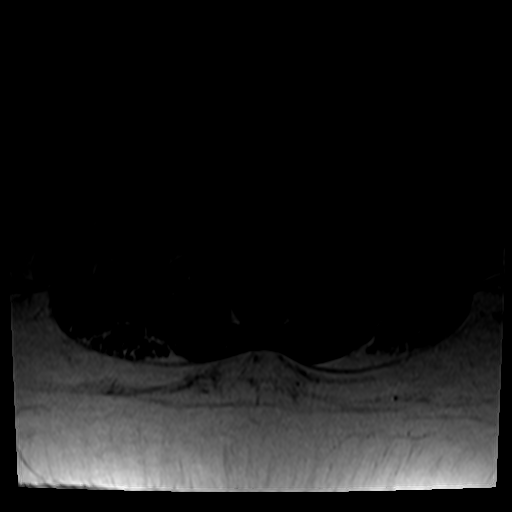
[im 33/39]
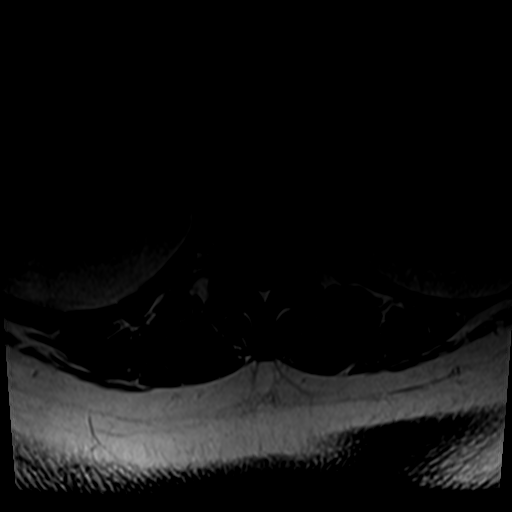

[25 of 48 positions shown; findings below may reference images not displayed]

FINDINGS: Segmentation: Standard. Lowest well-formed disc space labeled the
L5-S1 level.

Alignment: Trace 2 mm facet mediated anterolisthesis of L4 on L5.
Alignment otherwise normal preservation of the normal lumbar
lordosis.

Vertebrae: Vertebral body height maintained without acute or chronic
fracture. Bone marrow signal intensity within normal limits. No
discrete or worrisome osseous lesions or abnormal marrow edema.

Conus medullaris and cauda equina: Conus extends to the L1 level.
Conus and cauda equina appear normal.

Paraspinal and other soft tissues: Unremarkable.

Disc levels:

T11-12: Disc desiccation with mild disc bulge and reactive endplate
spurring. No stenosis.

T12-L1: Unremarkable.

L1-2:  Unremarkable.

L2-3: Disc desiccation with minimal disc bulge. No canal or
foraminal stenosis.

L3-4: Disc desiccation with minimal disc bulge. Mild bilateral facet
hypertrophy. 6 mm synovial cyst present at the medial aspect of the
left L3-4 facet (series 5, image 24). No significant spinal
stenosis. Foramina remain patent.

L4-5: Trace anterolisthesis. Mild disc bulge with disc desiccation
and intervertebral disc space narrowing. Superimposed small right
paracentral disc protrusion with slight inferior migration (series
5, image 30). Mild to moderate facet hypertrophy. Borderline mild
narrowing of the lateral recesses bilaterally. Central canal remains
patent. There is an additional small left foraminal to
extraforaminal disc protrusion contacting the exiting left L4 nerve
root as it courses of the left neural foramen (series 3, image 12).
Mild left greater than right L4 foraminal stenosis.

L5-S1: Degenerative intervertebral disc space narrowing with diffuse
disc bulge and disc desiccation. Associated reactive endplate
spurring. Posterior disc osteophyte closely approximates the
descending S1 nerve roots without frank impingement or displacement.
Mild facet spurring. No canal or lateral recess stenosis. Mild right
with moderate left L5 foraminal stenosis.
IMPRESSION: 1. Small left foraminal to extraforaminal disc protrusion at L4-5,
contacting the exiting left L4 nerve root.
2. Degenerative disc osteophyte with facet hypertrophy at L5-S1 with
resultant moderate left L5 foraminal stenosis.
3. Mild to moderate facet hypertrophy at L3-4 and L4-5, which could
contribute to lower back pain.

## 2022-01-22 ENCOUNTER — Encounter: Payer: Self-pay | Admitting: Sports Medicine

## 2022-01-22 DIAGNOSIS — M5136 Other intervertebral disc degeneration, lumbar region: Secondary | ICD-10-CM

## 2022-01-22 DIAGNOSIS — M51369 Other intervertebral disc degeneration, lumbar region without mention of lumbar back pain or lower extremity pain: Secondary | ICD-10-CM

## 2022-01-24 ENCOUNTER — Encounter (HOSPITAL_BASED_OUTPATIENT_CLINIC_OR_DEPARTMENT_OTHER)
Admission: RE | Admit: 2022-01-24 | Discharge: 2022-01-24 | Disposition: A | Discharge status: Home / Self Care | Payer: Medicaid Other | Source: Ambulatory Visit | Attending: Orthopaedic Surgery | Admitting: Orthopaedic Surgery

## 2022-01-24 DIAGNOSIS — N189 Chronic kidney disease, unspecified: Secondary | ICD-10-CM | POA: Diagnosis not present

## 2022-01-24 DIAGNOSIS — X58XXXA Exposure to other specified factors, initial encounter: Secondary | ICD-10-CM | POA: Diagnosis not present

## 2022-01-24 DIAGNOSIS — G8929 Other chronic pain: Secondary | ICD-10-CM | POA: Diagnosis not present

## 2022-01-24 DIAGNOSIS — G4730 Sleep apnea, unspecified: Secondary | ICD-10-CM | POA: Diagnosis not present

## 2022-01-24 DIAGNOSIS — Z6841 Body Mass Index (BMI) 40.0 and over, adult: Secondary | ICD-10-CM | POA: Diagnosis not present

## 2022-01-24 DIAGNOSIS — S46012A Strain of muscle(s) and tendon(s) of the rotator cuff of left shoulder, initial encounter: Secondary | ICD-10-CM | POA: Diagnosis present

## 2022-01-24 DIAGNOSIS — I350 Nonrheumatic aortic (valve) stenosis: Secondary | ICD-10-CM | POA: Diagnosis not present

## 2022-01-24 DIAGNOSIS — M7542 Impingement syndrome of left shoulder: Secondary | ICD-10-CM | POA: Diagnosis not present

## 2022-01-24 DIAGNOSIS — S43432A Superior glenoid labrum lesion of left shoulder, initial encounter: Secondary | ICD-10-CM | POA: Diagnosis not present

## 2022-01-24 DIAGNOSIS — M19012 Primary osteoarthritis, left shoulder: Secondary | ICD-10-CM | POA: Diagnosis not present

## 2022-01-24 DIAGNOSIS — I129 Hypertensive chronic kidney disease with stage 1 through stage 4 chronic kidney disease, or unspecified chronic kidney disease: Secondary | ICD-10-CM | POA: Diagnosis not present

## 2022-01-24 LAB — BASIC METABOLIC PANEL
Anion gap: 6 (ref 5–15)
BUN: 18 mg/dL (ref 6–20)
CO2: 31 mmol/L (ref 22–32)
Calcium: 9.4 mg/dL (ref 8.9–10.3)
Chloride: 103 mmol/L (ref 98–111)
Creatinine, Ser: 1.22 mg/dL — ABNORMAL HIGH (ref 0.44–1.00)
GFR, Estimated: 52 mL/min — ABNORMAL LOW (ref >60)
Glucose, Bld: 96 mg/dL (ref 70–99)
Potassium: 4.2 mmol/L (ref 3.5–5.1)
Sodium: 140 mmol/L (ref 135–145)

## 2022-01-26 ENCOUNTER — Other Ambulatory Visit (HOSPITAL_COMMUNITY): Payer: Self-pay | Admitting: Cardiovascular Disease

## 2022-01-26 ENCOUNTER — Other Ambulatory Visit: Payer: Self-pay | Admitting: Cardiovascular Disease

## 2022-01-26 ENCOUNTER — Other Ambulatory Visit: Payer: Self-pay

## 2022-01-26 ENCOUNTER — Encounter (HOSPITAL_BASED_OUTPATIENT_CLINIC_OR_DEPARTMENT_OTHER): Payer: Self-pay | Admitting: Orthopaedic Surgery

## 2022-01-26 ENCOUNTER — Encounter (HOSPITAL_BASED_OUTPATIENT_CLINIC_OR_DEPARTMENT_OTHER)
Admission: RE | Disposition: A | Discharge status: Home / Self Care | Payer: Self-pay | Source: Home / Self Care | Attending: Orthopaedic Surgery

## 2022-01-26 ENCOUNTER — Ambulatory Visit (HOSPITAL_BASED_OUTPATIENT_CLINIC_OR_DEPARTMENT_OTHER): Payer: Medicaid Other | Admitting: Anesthesiology

## 2022-01-26 ENCOUNTER — Ambulatory Visit (HOSPITAL_BASED_OUTPATIENT_CLINIC_OR_DEPARTMENT_OTHER)
Admission: RE | Admit: 2022-01-26 | Discharge: 2022-01-26 | Disposition: A | Discharge status: Home / Self Care | Payer: Medicaid Other | Attending: Orthopaedic Surgery | Admitting: Orthopaedic Surgery

## 2022-01-26 DIAGNOSIS — I129 Hypertensive chronic kidney disease with stage 1 through stage 4 chronic kidney disease, or unspecified chronic kidney disease: Secondary | ICD-10-CM | POA: Insufficient documentation

## 2022-01-26 DIAGNOSIS — S43432A Superior glenoid labrum lesion of left shoulder, initial encounter: Secondary | ICD-10-CM | POA: Insufficient documentation

## 2022-01-26 DIAGNOSIS — M19012 Primary osteoarthritis, left shoulder: Secondary | ICD-10-CM | POA: Insufficient documentation

## 2022-01-26 DIAGNOSIS — N183 Chronic kidney disease, stage 3 unspecified: Secondary | ICD-10-CM

## 2022-01-26 DIAGNOSIS — M7542 Impingement syndrome of left shoulder: Secondary | ICD-10-CM

## 2022-01-26 DIAGNOSIS — S46012A Strain of muscle(s) and tendon(s) of the rotator cuff of left shoulder, initial encounter: Secondary | ICD-10-CM | POA: Diagnosis not present

## 2022-01-26 DIAGNOSIS — S43431A Superior glenoid labrum lesion of right shoulder, initial encounter: Secondary | ICD-10-CM | POA: Diagnosis not present

## 2022-01-26 DIAGNOSIS — G4730 Sleep apnea, unspecified: Secondary | ICD-10-CM | POA: Insufficient documentation

## 2022-01-26 DIAGNOSIS — I350 Nonrheumatic aortic (valve) stenosis: Secondary | ICD-10-CM | POA: Insufficient documentation

## 2022-01-26 DIAGNOSIS — N189 Chronic kidney disease, unspecified: Secondary | ICD-10-CM | POA: Insufficient documentation

## 2022-01-26 DIAGNOSIS — M75102 Unspecified rotator cuff tear or rupture of left shoulder, not specified as traumatic: Secondary | ICD-10-CM | POA: Diagnosis not present

## 2022-01-26 DIAGNOSIS — R0789 Other chest pain: Secondary | ICD-10-CM

## 2022-01-26 DIAGNOSIS — M5136 Other intervertebral disc degeneration, lumbar region: Secondary | ICD-10-CM

## 2022-01-26 DIAGNOSIS — Z6841 Body Mass Index (BMI) 40.0 and over, adult: Secondary | ICD-10-CM | POA: Insufficient documentation

## 2022-01-26 DIAGNOSIS — X58XXXA Exposure to other specified factors, initial encounter: Secondary | ICD-10-CM | POA: Insufficient documentation

## 2022-01-26 DIAGNOSIS — G8929 Other chronic pain: Secondary | ICD-10-CM | POA: Insufficient documentation

## 2022-01-26 HISTORY — DX: Sleep apnea, unspecified: G47.30

## 2022-01-26 HISTORY — PX: SHOULDER ARTHROSCOPY WITH DISTAL CLAVICLE RESECTION: SHX5675

## 2022-01-26 HISTORY — DX: Chronic kidney disease, unspecified: N18.9

## 2022-01-26 HISTORY — PX: SHOULDER ARTHROSCOPY WITH SUBACROMIAL DECOMPRESSION, ROTATOR CUFF REPAIR AND BICEP TENDON REPAIR: SHX5687

## 2022-01-26 HISTORY — DX: Essential (primary) hypertension: I10

## 2022-01-26 SURGERY — SHOULDER ARTHROSCOPY WITH SUBACROMIAL DECOMPRESSION, ROTATOR CUFF REPAIR AND BICEP TENDON REPAIR
Anesthesia: General | Site: Shoulder | Laterality: Left

## 2022-01-26 MED ORDER — DEXAMETHASONE SODIUM PHOSPHATE 4 MG/ML IJ SOLN
INTRAMUSCULAR | Status: DC | PRN
Start: 2022-01-26 — End: 2022-01-26
  Administered 2022-01-26: 5 mg via INTRAVENOUS

## 2022-01-26 MED ORDER — ACETAMINOPHEN 500 MG PO TABS: 1000.0000 mg | ORAL_TABLET | Freq: Three times a day (TID) | ORAL | 0 refills | Status: AC

## 2022-01-26 MED ORDER — PROPOFOL 10 MG/ML IV BOLUS
INTRAVENOUS | Status: AC
  Filled 2022-01-26: qty 20

## 2022-01-26 MED ORDER — LIDOCAINE 2% (20 MG/ML) 5 ML SYRINGE
INTRAMUSCULAR | Status: AC
  Filled 2022-01-26: qty 5

## 2022-01-26 MED ORDER — ROCURONIUM BROMIDE 10 MG/ML (PF) SYRINGE
PREFILLED_SYRINGE | INTRAVENOUS | Status: AC
  Filled 2022-01-26: qty 10

## 2022-01-26 MED ORDER — ACETAMINOPHEN 500 MG PO TABS
1000.0000 mg | ORAL_TABLET | Freq: Once | ORAL | Status: AC
  Administered 2022-01-26: 1000 mg via ORAL

## 2022-01-26 MED ORDER — CELECOXIB 200 MG PO CAPS: 200.0000 mg | ORAL_CAPSULE | Freq: Two times a day (BID) | ORAL | 0 refills | Status: AC

## 2022-01-26 MED ORDER — MIDAZOLAM HCL 2 MG/2ML IJ SOLN
INTRAMUSCULAR | Status: AC
  Filled 2022-01-26: qty 2

## 2022-01-26 MED ORDER — BUPIVACAINE LIPOSOME 1.3 % IJ SUSP
INTRAMUSCULAR | Status: DC | PRN
Start: 2022-01-26 — End: 2022-01-26
  Administered 2022-01-26: 10 mL via PERINEURAL

## 2022-01-26 MED ORDER — BUPIVACAINE-EPINEPHRINE (PF) 0.5% -1:200000 IJ SOLN
INTRAMUSCULAR | Status: DC | PRN
  Administered 2022-01-26: 15 mL via PERINEURAL

## 2022-01-26 MED ORDER — PHENYLEPHRINE HCL (PRESSORS) 10 MG/ML IV SOLN
INTRAVENOUS | Status: AC
  Filled 2022-01-26: qty 1

## 2022-01-26 MED ORDER — FENTANYL CITRATE (PF) 100 MCG/2ML IJ SOLN
INTRAMUSCULAR | Status: DC | PRN
  Administered 2022-01-26: 50 ug via INTRAVENOUS

## 2022-01-26 MED ORDER — ONDANSETRON HCL 4 MG/2ML IJ SOLN
INTRAMUSCULAR | Status: AC
  Filled 2022-01-26: qty 2

## 2022-01-26 MED ORDER — LACTATED RINGERS IV SOLN: INTRAVENOUS | Status: DC

## 2022-01-26 MED ORDER — CEFAZOLIN SODIUM-DEXTROSE 2-4 GM/100ML-% IV SOLN
2.0000 g | INTRAVENOUS | Status: AC
  Administered 2022-01-26: 2 g via INTRAVENOUS

## 2022-01-26 MED ORDER — FENTANYL CITRATE (PF) 100 MCG/2ML IJ SOLN
INTRAMUSCULAR | Status: AC
Start: 2022-01-26 — End: ?
  Filled 2022-01-26: qty 2

## 2022-01-26 MED ORDER — FENTANYL CITRATE (PF) 100 MCG/2ML IJ SOLN
50.0000 ug | Freq: Once | INTRAMUSCULAR | Status: AC
  Administered 2022-01-26: 50 ug via INTRAVENOUS

## 2022-01-26 MED ORDER — ROCURONIUM BROMIDE 100 MG/10ML IV SOLN
INTRAVENOUS | Status: DC | PRN
Start: 2022-01-26 — End: 2022-01-26
  Administered 2022-01-26: 50 mg via INTRAVENOUS

## 2022-01-26 MED ORDER — FENTANYL CITRATE (PF) 100 MCG/2ML IJ SOLN
INTRAMUSCULAR | Status: AC
  Filled 2022-01-26: qty 2

## 2022-01-26 MED ORDER — PROPOFOL 10 MG/ML IV BOLUS
INTRAVENOUS | Status: DC | PRN
  Administered 2022-01-26: 180 mg via INTRAVENOUS

## 2022-01-26 MED ORDER — ONDANSETRON HCL 4 MG/2ML IJ SOLN
INTRAMUSCULAR | Status: DC | PRN
  Administered 2022-01-26: 4 mg via INTRAVENOUS

## 2022-01-26 MED ORDER — PHENYLEPHRINE HCL-NACL 20-0.9 MG/250ML-% IV SOLN
INTRAVENOUS | Status: DC | PRN
  Administered 2022-01-26: 60 ug/min via INTRAVENOUS

## 2022-01-26 MED ORDER — ONDANSETRON HCL 4 MG PO TABS: 4.0000 mg | ORAL_TABLET | Freq: Three times a day (TID) | ORAL | 0 refills | Status: AC | PRN

## 2022-01-26 MED ORDER — SODIUM CHLORIDE 0.9 % IR SOLN
Status: DC | PRN
  Administered 2022-01-26: 10000 mL

## 2022-01-26 MED ORDER — FENTANYL CITRATE (PF) 100 MCG/2ML IJ SOLN: 25.0000 ug | INTRAMUSCULAR | Status: DC | PRN

## 2022-01-26 MED ORDER — PHENYLEPHRINE 80 MCG/ML (10ML) SYRINGE FOR IV PUSH (FOR BLOOD PRESSURE SUPPORT)
PREFILLED_SYRINGE | INTRAVENOUS | Status: AC
  Filled 2022-01-26: qty 20

## 2022-01-26 MED ORDER — EPINEPHRINE PF 1 MG/ML IJ SOLN
INTRAMUSCULAR | Status: AC
  Filled 2022-01-26: qty 4

## 2022-01-26 MED ORDER — SUGAMMADEX SODIUM 200 MG/2ML IV SOLN
INTRAVENOUS | Status: DC | PRN
  Administered 2022-01-26: 200 mg via INTRAVENOUS

## 2022-01-26 MED ORDER — ACETAMINOPHEN 500 MG PO TABS
ORAL_TABLET | ORAL | Status: AC
  Filled 2022-01-26: qty 2

## 2022-01-26 MED ORDER — LIDOCAINE HCL (CARDIAC) PF 100 MG/5ML IV SOSY
PREFILLED_SYRINGE | INTRAVENOUS | Status: DC | PRN
Start: 2022-01-26 — End: 2022-01-26
  Administered 2022-01-26: 50 mg via INTRAVENOUS

## 2022-01-26 MED ORDER — MIDAZOLAM HCL 2 MG/2ML IJ SOLN
2.0000 mg | Freq: Once | INTRAMUSCULAR | Status: AC
Start: 2022-01-26 — End: 2022-01-26
  Administered 2022-01-26: 2 mg via INTRAVENOUS

## 2022-01-26 MED ORDER — GABAPENTIN 800 MG PO TABS: 800.0000 mg | ORAL_TABLET | Freq: Three times a day (TID) | ORAL | 0 refills | Status: DC

## 2022-01-26 MED ORDER — CEFAZOLIN SODIUM-DEXTROSE 2-4 GM/100ML-% IV SOLN
INTRAVENOUS | Status: AC
Start: 2022-01-26 — End: ?
  Filled 2022-01-26: qty 100

## 2022-01-26 MED ORDER — PHENYLEPHRINE HCL (PRESSORS) 10 MG/ML IV SOLN
INTRAVENOUS | Status: AC
Start: 2022-01-26 — End: ?
  Filled 2022-01-26: qty 1

## 2022-01-26 SURGICAL SUPPLY — 59 items
AID PSTN UNV HD RSTRNT DISP (MISCELLANEOUS) ×1
ANCH SUT 2.6 FBRTK 1.7 (Anchor) ×1 IMPLANT
ANCH SUT SWLK 19.1X4.75 (Anchor) ×1 IMPLANT
ANCHOR FIBERTAK RC 2.6 (BLUE) (Anchor) ×1 IMPLANT
ANCHOR SUT BIO SW 4.75X19.1 (Anchor) ×1 IMPLANT
APL PRP STRL LF DISP 70% ISPRP (MISCELLANEOUS) ×1
BLADE EXCALIBUR 4.0X13 (MISCELLANEOUS) ×2 IMPLANT
BURR OVAL 8 FLU 4.0X13 (MISCELLANEOUS) ×1 IMPLANT
CANNULA 5.75X71 LONG (CANNULA) IMPLANT
CANNULA PASSPORT BUTTON 10-40 (CANNULA) ×1 IMPLANT
CANNULA TWIST IN 8.25X7CM (CANNULA) IMPLANT
CHLORAPREP W/TINT 26 (MISCELLANEOUS) ×2 IMPLANT
COOLER ICEMAN CLASSIC (MISCELLANEOUS) ×2 IMPLANT
DRAPE IMP U-DRAPE 54X76 (DRAPES) ×2 IMPLANT
DRAPE SHOULDER BEACH CHAIR (DRAPES) ×2 IMPLANT
DRSG PAD ABDOMINAL 8X10 ST (GAUZE/BANDAGES/DRESSINGS) ×2 IMPLANT
DW OUTFLOW CASSETTE/TUBE SET (MISCELLANEOUS) ×2 IMPLANT
GAUZE SPONGE 4X4 12PLY STRL (GAUZE/BANDAGES/DRESSINGS) ×2 IMPLANT
GLOVE BIO SURGEON STRL SZ 6.5 (GLOVE) ×2 IMPLANT
GLOVE BIO SURGEON STRL SZ7 (GLOVE) ×1 IMPLANT
GLOVE BIOGEL PI IND STRL 6.5 (GLOVE) ×1 IMPLANT
GLOVE BIOGEL PI IND STRL 7.0 (GLOVE) IMPLANT
GLOVE BIOGEL PI IND STRL 8 (GLOVE) ×1 IMPLANT
GLOVE BIOGEL PI INDICATOR 6.5 (GLOVE) ×1
GLOVE BIOGEL PI INDICATOR 7.0 (GLOVE) ×1
GLOVE BIOGEL PI INDICATOR 8 (GLOVE) ×1
GLOVE ECLIPSE 8.0 STRL XLNG CF (GLOVE) ×2 IMPLANT
GOWN STRL REUS W/ TWL LRG LVL3 (GOWN DISPOSABLE) ×2 IMPLANT
GOWN STRL REUS W/TWL LRG LVL3 (GOWN DISPOSABLE) ×4
GOWN STRL REUS W/TWL XL LVL3 (GOWN DISPOSABLE) ×2 IMPLANT
KIT SHOULDER STAB MARCO (KITS) ×2 IMPLANT
KIT STR SPEAR 1.8 FBRTK DISP (KITS) IMPLANT
LASSO CRESCENT QUICKPASS (SUTURE) IMPLANT
LOOP 2 FIBERLINK CLOSED (SUTURE) ×1 IMPLANT
MANIFOLD NEPTUNE II (INSTRUMENTS) ×2 IMPLANT
NDL SAFETY ECLIPSE 18X1.5 (NEEDLE) ×1 IMPLANT
NDL SCORPION MULTI FIRE (NEEDLE) IMPLANT
NEEDLE HYPO 18GX1.5 SHARP (NEEDLE) ×2
NEEDLE SCORPION MULTI FIRE (NEEDLE) ×2 IMPLANT
PACK ARTHROSCOPY DSU (CUSTOM PROCEDURE TRAY) ×2 IMPLANT
PACK BASIN DAY SURGERY FS (CUSTOM PROCEDURE TRAY) ×2 IMPLANT
PAD COLD SHLDR WRAP-ON (PAD) ×2 IMPLANT
PORT APPOLLO RF 90DEGREE MULTI (SURGICAL WAND) ×2 IMPLANT
RESTRAINT HEAD UNIVERSAL NS (MISCELLANEOUS) ×2 IMPLANT
SHEET MEDIUM DRAPE 40X70 STRL (DRAPES) IMPLANT
SLEEVE SCD COMPRESS KNEE MED (STOCKING) ×2 IMPLANT
SLING ARM FOAM STRAP LRG (SOFTGOODS) IMPLANT
STRIP CLOSURE SKIN 1/2X4 (GAUZE/BANDAGES/DRESSINGS) ×2 IMPLANT
SUT FIBERWIRE #2 38 T-5 BLUE (SUTURE)
SUT MNCRL AB 4-0 PS2 18 (SUTURE) ×2 IMPLANT
SUT PDS AB 1 CT  36 (SUTURE)
SUT PDS AB 1 CT 36 (SUTURE) IMPLANT
SUT TIGER TAPE 7 IN WHITE (SUTURE) IMPLANT
SUTURE FIBERWR #2 38 T-5 BLUE (SUTURE) IMPLANT
SYR 5ML LL (SYRINGE) ×2 IMPLANT
TAPE FIBER 2MM 7IN #2 BLUE (SUTURE) IMPLANT
TOWEL GREEN STERILE FF (TOWEL DISPOSABLE) ×4 IMPLANT
TUBE CONNECTING 20X1/4 (TUBING) ×2 IMPLANT
TUBING ARTHROSCOPY IRRIG 16FT (MISCELLANEOUS) ×2 IMPLANT

## 2022-01-26 NOTE — Transfer of Care (Signed)
Immediate Anesthesia Transfer of Care Note ? ?Patient: Patricia Carrillo ? ?Procedure(s) Performed: SHOULDER ARTHROSCOPY WITH SUBACROMIAL DECOMPRESSION, ROTATOR CUFF REPAIR AND BICEP TENDON REPAIR (Left: Shoulder) ?SHOULDER ARTHROSCOPY WITH DISTAL CLAVICLE EXCISION/ DEBRIDEMENT (Left: Shoulder) ? ?Patient Location: PACU ? ?Anesthesia Type:General ? ?Level of Consciousness: awake, alert  and oriented ? ?Airway & Oxygen Therapy: Patient Spontanous Breathing and Patient connected to nasal cannula oxygen ? ?Post-op Assessment: Report given to RN and Post -op Vital signs reviewed and stable ? ?Post vital signs: Reviewed and stable ? ?Last Vitals:  ?Vitals Value Taken Time  ?BP 110/48 01/26/22 1219  ?Temp    ?Pulse 93 01/26/22 1221  ?Resp 15 01/26/22 1221  ?SpO2 98 % 01/26/22 1221  ?Vitals shown include unvalidated device data. ? ?Last Pain:  ?Vitals:  ? 01/26/22 0908  ?TempSrc: Oral  ?PainSc: 8   ?   ? ?  ? ?Complications: No notable events documented. ?

## 2022-01-26 NOTE — Anesthesia Postprocedure Evaluation (Signed)
Anesthesia Post Note ? ?Patient: Keela Rubert ? ?Procedure(s) Performed: SHOULDER ARTHROSCOPY WITH SUBACROMIAL DECOMPRESSION, ROTATOR CUFF REPAIR (Left: Shoulder) ?SHOULDER ARTHROSCOPY WITH DISTAL CLAVICLE EXCISION/ DEBRIDEMENT (Left: Shoulder) ? ?  ? ?Patient location during evaluation: PACU ?Anesthesia Type: General and Regional ?Level of consciousness: awake and alert ?Pain management: pain level controlled ?Vital Signs Assessment: post-procedure vital signs reviewed and stable ?Respiratory status: spontaneous breathing, nonlabored ventilation and respiratory function stable ?Cardiovascular status: blood pressure returned to baseline and stable ?Postop Assessment: no apparent nausea or vomiting ?Anesthetic complications: no ? ? ?No notable events documented. ? ?Last Vitals:  ?Vitals:  ? 01/26/22 1245 01/26/22 1359  ?BP: 133/83 132/73  ?Pulse: 93 93  ?Resp: 13   ?Temp:  37.1 ?C  ?SpO2: 98% 92%  ?  ?Last Pain:  ?Vitals:  ? 01/26/22 1359  ?TempSrc: Oral  ?PainSc: 0-No pain  ? ? ?  ?  ?  ?  ?  ?  ? ?Leanard Dimaio,W. EDMOND ? ? ? ? ?

## 2022-01-26 NOTE — Op Note (Signed)
Orthopaedic Surgery Operative Note (CSN: [id number]) ? ?Patricia Carrillo  10-23-1965 ?Date of Surgery: 01/26/2022 ? ? ?DIAGNOSES: ?Left shoulder, acute on chronic rotator cuff tear, SLAP tear, AC arthritis, and subacromial impingement. ? ?POST-OPERATIVE DIAGNOSIS: same ? ?PROCEDURE: ?Arthroscopic extensive debridement - 29823 Subdeltoid Bursa, Supraspinatus Tendon, Superior Labrum, and biceps tenotomy ?Arthroscopic distal clavicle excision - 02585 ?Arthroscopic subacromial decompression - [phone] ?Arthroscopic rotator cuff repair - [phone] ?  ?OPERATIVE FINDING: ?Exam under anesthesia: Normal ?Articular space: Normal ?Chondral surfaces: Normal ?Biceps:  Type II SLAP tear ?Subscapularis: Intact  ?Supraspinatus: Complete tear significantly poor tissue quality throughout the supraspinatus with striation and fraying throughout the tendon. ?Infraspinatus: Intact  ? ? ?Patient's superior cuff involving the supraspinatus was extremely poor quality.  She has a very high risk of retear secondary to the fibers of the tendon being very striated and frayed. ? ?Post-operative plan: The patient will be non-weightbearing in a sling 6 weeks.  The patient will be discharged home.  DVT prophylaxis not indicated in ambulatory upper extremity patient without known risk factors.   Pain control with PRN pain medication preferring oral medicines.  Follow up plan will be scheduled in approximately 7 days for incision check and XR. ? ?Surgeons:Primary: Bjorn Pippin, MD ?Assistants:Caroline McBane PA-C ?Location: [facility] OR ROOM 1 ?Anesthesia: General with Exparel interscalene block ?Antibiotics: Ancef 2 g ?Tourniquet time: None ?Estimated Blood Loss: Minimal ?Complications: None ?Specimens: None ?Implants: ?Implant Name Type Inv. Item Serial No. Manufacturer Lot No. LRB No. Used Action  ?ANCHOR FIBERTAK RC 2.6 [facility]) - [id number] Anchor ANCHOR FIBERTAK RC 2.6 [place])  [employer] [place] [id number] Left 1 Implanted  ?ANCHOR SUT BIO SW 4.75X19.1 - [id number] Anchor  ANCHOR SUT BIO SW 4.75X19.1  Marcie Bal [id number] Left 1 Implanted  ? ? ?Indications for Surgery:   ?Patricia Carrillo is a 56 y.o. female with continued shoulder pain refractory to nonoperative measures for extended period of time.    The risks and benefits were explained at length including but not limited to continued pain, cuff failure, biceps tenodesis failure, stiffness, need for further surgery and infection. ? ? ?Procedure:   ?Patient was correctly identified in the preoperative holding area and operative site marked.  Patient brought to OR and positioned beachchair on an [place] table ensuring that all bony prominences were padded and the head was in an appropriate location.  Anesthesia was induced and the operative shoulder was prepped and draped in the usual sterile fashion.  Timeout was called preincision. ? ?A standard posterior viewing portal was made after localizing the portal with a spinal needle.  An anterior accessory portal was also made.  After clearing the articular space the camera was positioned in the subacromial space.  Findings above.   ? ?Extensive debridement was performed of the anterior interval tissue, labral fraying and the bursa.  Biceps tenotomy performed.  Residual SLAP was debrided. ? ?Biceps tenodesis: We marked the tendon and then performed a tenotomy and debridement of the stump in the articular space. We then identified the biceps tendon in its groove suprapec with the arthroscope in the lateral portal taking care to move from lateral to medial to avoid injury to the subscapularis. At that point we unroofed the tendon itself and mobilized it. An accessory anterior portal was made in line with the tendon and we grasped it from the anterior superior portal and worked from the accessory anterior portal. Two Fibertak 1.75mm knotless anchors were placed in the groove and the tendon was secured in a luggage  loop style fashion with a pass of the limb of suture through the tendon using a  scorpion device to avoid pull-through.  Repair was completed with good tension on the tendon.  Residual stump of the tendon was removed after being resected with a RF ablator. ? ?Distal Clavicle resection:  The scope was placed in the subacromial space from the posterior portal.  A hemostat was placed through the anterior portal and we spread at the [facility] joint.  A burr was then inserted and 10 mm of distal clavicle was resected taking care to avoid damage to the capsule around the joint and avoiding overhanging bone posteriorly.   ? ?We identified that the cuff is torn in the supraspinatus full-thickness.  We debrided unhealthy appearing cuff but the tissue was quite thin and poor quality.  We used a single 2.6 knotless fiber tack anchor with a knotless mechanism that was placed at the medial articular border.  We then passed the sutures and performed a medial tiedown.  The tapes were brought over to the lateral row which was a 4.75 bio composite swivel lock 8 to 10 mm below the tuberosity. ? ?The incisions were closed with absorbable monocryl and steri strips.  A sterile dressing was placed along with a sling. The patient was awoken from general anesthesia and taken to the PACU in stable condition without complication.  ? ?Alfonse Alpers, PA-C, present and scrubbed throughout the case, critical for completion in a timely fashion, and for retraction, instrumentation, closure. ? ? ?

## 2022-01-26 NOTE — Anesthesia Preprocedure Evaluation (Addendum)
Anesthesia Evaluation  ?Patient identified by MRN, date of birth, ID band ?Patient awake ? ? ? ?Reviewed: ?Allergy & Precautions, H&P , NPO status , Patient's Chart, lab work & pertinent test results ? ?Airway ?Mallampati: III ? ?TM Distance: >3 FB ?Neck ROM: Full ? ?Mouth opening: Limited Mouth Opening ? Dental ?no notable dental hx. ?(+) Teeth Intact, Dental Advisory Given ?  ?Pulmonary ?sleep apnea ,  ?  ?Pulmonary exam normal ?breath sounds clear to auscultation ? ? ? ? ? ? Cardiovascular ?hypertension, Pt. on medications ?+ Valvular Problems/Murmurs AS  ?Rhythm:Regular Rate:Normal ? ? ?  ?Neuro/Psych ? Headaches, Anxiety   ? GI/Hepatic ?negative GI ROS, Neg liver ROS,   ?Endo/Other  ?Morbid obesity ? Renal/GU ?Renal InsufficiencyRenal disease  ?negative genitourinary ?  ?Musculoskeletal ? ?(+) Arthritis , Osteoarthritis,   ? Abdominal ?  ?Peds ? Hematology ?negative hematology ROS ?(+)   ?Anesthesia Other Findings ? ? Reproductive/Obstetrics ?negative OB ROS ? ?  ? ? ? ? ? ? ? ? ? ? ? ? ? ?  ?  ? ? ? ? ? ? ? ?Anesthesia Physical ?Anesthesia Plan ? ?ASA: 3 ? ?Anesthesia Plan: General  ? ?Post-op Pain Management: Regional block* and Tylenol PO (pre-op)*  ? ?Induction: Intravenous ? ?PONV Risk Score and Plan: 3 and Ondansetron, Dexamethasone and Midazolam ? ?Airway Management Planned: Oral ETT ? ?Additional Equipment:  ? ?Intra-op Plan:  ? ?Post-operative Plan: Extubation in OR ? ?Informed Consent: I have reviewed the patients History and Physical, chart, labs and discussed the procedure including the risks, benefits and alternatives for the proposed anesthesia with the patient or authorized representative who has indicated his/her understanding and acceptance.  ? ? ? ?Dental advisory given ? ?Plan Discussed with: CRNA ? ?Anesthesia Plan Comments:   ? ? ? ? ? ? ?Anesthesia Quick Evaluation ? ?

## 2022-01-26 NOTE — Anesthesia Procedure Notes (Signed)
Anesthesia Regional Block: Interscalene brachial plexus block  ? ?Pre-Anesthetic Checklist: , timeout performed,  Correct Patient, Correct Site, Correct Laterality,  Correct Procedure, Correct Position, site marked,  Risks and benefits discussed,  Pre-op evaluation,  At surgeon's request and post-op pain management ? ?Laterality: Left ? ?Prep: Maximum Sterile Barrier Precautions used, chloraprep     ?  ?Needles:  ?Injection technique: Single-shot ? ?Needle Type: Echogenic Stimulator Needle   ? ? ?Needle Length: 9cm  ?Needle Gauge: 21  ? ? ? ?Additional Needles: ? ? ?Procedures:,,,, ultrasound used (permanent image in chart),,    ?Narrative:  ?Start time: 01/26/2022 9:30 AM ?End time: 01/26/2022 9:40 AM ?Injection made incrementally with aspirations every 5 mL. ? ?Performed by: Personally  ?Anesthesiologist: Gaynelle Adu, MD ? ? ? ? ?

## 2022-01-26 NOTE — H&P (Signed)
? ? ?PREOPERATIVE H&P ? ?Chief Complaint: LEFT SHOULDER CARTILAGE DISORDER, BICEP TENDINITIS,IMPINGEMENT SYNDROME, ROTATOR CUFF STRAIN ? ?HPI: ?Cellie Dardis is a 56 y.o. female who is scheduled for, Procedure(s): ?SHOULDER ARTHROSCOPY WITH SUBACROMIAL DECOMPRESSION, ROTATOR CUFF REPAIR AND BICEP TENDON REPAIR ?SHOULDER ARTHROSCOPY WITH DISTAL CLAVICLE EXCISION/ DEBRIDEMENT.  ? ?Patient has a past medical history significant for CKD, sleep apnea, HTN, chronic pain on Oxycodone 10 mg every 6 hours - on pain contract.  ? ?Patient has had left shoulder pain for quite some time. She believes the pain began in summer of 2020.  She had injections.  The first one lasted several months, the second one only for a few weeks.  She is frustrated by the function of her left shoulder.  ? ?Symptoms are rated as moderate to severe, and have been worsening.  This is significantly impairing activities of daily living.   ? ?Please see clinic note for further details on this patient's care.   ? ?She has elected for surgical management.  ? ?Past Medical History:  ?Diagnosis Date  ? Anxiety   ? Chronic kidney disease   ? "stage 3" per pt  ? Fatigue   ? Headache   ? Hypertension   ? Obesity 01/16/2022  ? takes metformin and ozempic "for weight loss, not diabetic"  ? Sleep apnea   ? dx 15 years ago, never used CPAP  ? ?Past Surgical History:  ?Procedure Laterality Date  ? CHOLECYSTECTOMY    ? LAPAROSCOPIC HYSTERECTOMY    ? TONSILLECTOMY    ? TUBAL LIGATION    ? ?Social History  ? ?Socioeconomic History  ? Marital status: Divorced  ?  Spouse name: Not on file  ? Number of children: 4  ? Years of education: Not on file  ? Highest education level: Not on file  ?Occupational History  ? Not on file  ?Tobacco Use  ? Smoking status: Never  ? Smokeless tobacco: Never  ?Vaping Use  ? Vaping Use: Never used  ?Substance and Sexual Activity  ? Alcohol use: Not Currently  ? Drug use: Not on file  ? Sexual activity: Not on file  ?Other Topics Concern   ? Not on file  ?Social History Narrative  ? Not on file  ? ?Social Determinants of Health  ? ?Financial Resource Strain: Not on file  ?Food Insecurity: Not on file  ?Transportation Needs: Not on file  ?Physical Activity: Not on file  ?Stress: Not on file  ?Social Connections: Not on file  ? ?History reviewed. No pertinent family history. ?No Known Allergies ?Prior to Admission medications   ?Medication Sig Start Date End Date Taking? Authorizing Provider  ?gabapentin (NEURONTIN) 800 MG tablet Take 1 tablet (800 mg total) by mouth 2 (two) times daily. 01/16/22  Yes Monica Becton, MD  ?lisinopril-hydrochlorothiazide (ZESTORETIC) 20-12.5 MG tablet Take 1 tablet by mouth 2 (two) times daily. 02/21/21  Yes [provider]  ?lovastatin (MEVACOR) 20 MG tablet Take 20 mg by mouth daily. 02/21/21  Yes [provider]  ?metFORMIN (GLUCOPHAGE) 500 MG tablet Take 250 mg by mouth 2 (two) times daily with a meal.   Yes [provider]  ?oxyCODONE-acetaminophen (PERCOCET) 7.5-325 MG tablet Take 1 tablet by mouth 4 (four) times daily as needed. 11/14/21  Yes [provider]  ?Semaglutide (OZEMPIC, 0.25 OR 0.5 MG/DOSE, [place]) Inject into the skin.   Yes [provider]  ?tiZANidine (ZANAFLEX) 4 MG tablet Take 4 mg by mouth 3 (three) times daily. 01/11/22  Yes [provider]  ?triazolam (HALCION) 0.25 MG tablet 1-2 tabs PO 2 hours before procedure or imaging.  Do not drive with this medication. 01/16/22   Monica Becton, MD  ? ? ?ROS: All other systems have been reviewed and were otherwise negative with the exception of those mentioned in the HPI and as above. ? ?Physical Exam: ?General: Alert, no acute distress ?Cardiovascular: No pedal edema ?Respiratory: No cyanosis, no use of accessory musculature ?GI: No organomegaly, abdomen is soft and non-tender ?Skin: No lesions in the area of chief complaint ?Neurologic: Sensation intact distally ?Psychiatric: Patient is  competent for consent with normal mood and affect ?Lymphatic: No axillary or cervical lymphadenopathy ? ?MUSCULOSKELETAL:  ?Range of motion of the shoulder to 100 degrees, passively to 150, limited by pain.  4/5 supraspinatus and infraspinatus strength testing.  Positive AC tenderness to palpation, impingement and O'Brien's.   ? ?Imaging: ?MRI is very limited secondary to motion artifact, but she does appear to have a small full thickness tear of the supraspinatus.  Some fluid around the biceps.  Lateral sloping acromion and impingement. ? ?Assessment: ?LEFT SHOULDER CARTILAGE DISORDER, BICEP TENDINITIS,IMPINGEMENT SYNDROME, ROTATOR CUFF STRAIN ? ?Plan: ?Plan for Procedure(s): ?SHOULDER ARTHROSCOPY WITH SUBACROMIAL DECOMPRESSION, ROTATOR CUFF REPAIR AND BICEP TENDON REPAIR ?SHOULDER ARTHROSCOPY WITH DISTAL CLAVICLE EXCISION/ DEBRIDEMENT ? ?The risks benefits and alternatives were discussed with the patient including but not limited to the risks of nonoperative treatment, versus surgical intervention including infection, bleeding, nerve injury,  blood clots, cardiopulmonary complications, morbidity, mortality, among others, and they were willing to proceed.  ? ?The patient acknowledged the explanation, agreed to proceed with the plan and consent was signed.  ? ?Operative Plan: Left shoulder scope with SAD, DCE, BT, possible RCR ?Discharge Medications: Tylenol, Meloxicam BID, increase Gabapentin, muscle relaxer, zofran (already on high dose oxycodone) ?DVT Prophylaxis: None  ?Physical Therapy: Outpatient PT ?Special Discharge needs: Sling. IceMan ? ? ?Vernetta Honey, PA-C ? ?01/26/2022 ?6:45 AM ? ?

## 2022-01-26 NOTE — Interval H&P Note (Signed)
All questions answered, patient wants to proceed with procedure. ? ?

## 2022-01-26 NOTE — Anesthesia Procedure Notes (Signed)
Procedure Name: Intubation ?Date/Time: 01/26/2022 11:07 AM ?Performed by: Bufford Spikes, CRNA ?Pre-anesthesia Checklist: Patient identified, Emergency Drugs available, Suction available and Patient being monitored ?Patient Re-evaluated:Patient Re-evaluated prior to induction ?Oxygen Delivery Method: Circle system utilized ?Preoxygenation: Pre-oxygenation with 100% oxygen ?Induction Type: IV induction ?Ventilation: Mask ventilation without difficulty ?Laryngoscope Size: Sabra Heck and 2 ?Grade View: Grade II ?Tube type: Oral ?Tube size: 7.0 mm ?Number of attempts: 1 ?Airway Equipment and Method: Stylet and Oral airway ?Placement Confirmation: ETT inserted through vocal cords under direct vision, positive ETCO2 and breath sounds checked- equal and bilateral ?Secured at: 22 cm ?Tube secured with: Tape ?Dental Injury: Teeth and Oropharynx as per pre-operative assessment  ? ? ? ? ?

## 2022-01-26 NOTE — Discharge Instructions (Addendum)
Ophelia Charter MD, MPH ?Noemi Chapel, PA-C ?[facility] ?[address], [address] ?[phone] (tel)   ?[phone] (fax) ? ? ?POST-OPERATIVE INSTRUCTIONS - SHOULDER ARTHROSCOPY ? ?WOUND CARE ?You may remove the Operative Dressing on Post-Op Day #3 (72hrs after surgery).   ?Alternatively if you would like you can leave dressing on until follow-up if within 7-8 days but keep it dry. ?Leave steri-strips in place until they fall off on their own, usually 2 weeks postop. ?There may be a small amount of fluid/bleeding leaking at the surgical site.  ?This is normal; the shoulder is filled with fluid during the procedure and can leak for 24-48hrs after surgery.  ?You may change/reinforce the bandage as needed.  ?Use the Cryocuff or Ice as often as possible for the first 7 days, then as needed for pain relief. Always keep a towel, ACE wrap or other barrier between the cooling unit and your skin.  ?You may shower on Post-Op Day #3. Gently pat the area dry. Do not soak the shoulder in water or submerge it. Keep incisions as dry as possible. ?Do not go swimming in the pool or ocean until 4 weeks after surgery or when otherwise instructed.   ? ?EXERCISES/BRACING ?Sling should be used at all times until follow-up.  ?You can remove sling for hygiene ONLY.    ?Please continue to ambulate and do not stay sitting or lying for too long. Perform foot and wrist pumps to assist in circulation. ? ?PHYSICAL THERAPY ?- A physical therapy referral was sent to [facility] in [place]  ?- Any questions regarding physical therapy please let us know ? ?POST-OP MEDICATIONS- Multimodal approach to pain control ?In general your pain will be controlled with a combination of substances.  Prescriptions unless otherwise discussed are electronically sent to your pharmacy.  This is a carefully made plan we use to minimize narcotic use.    ? ?Celebrex - Anti-inflammatory medication taken on a scheduled  basis ?Acetaminophen - Non-narcotic pain medicine taken on a scheduled basis  ?Gabapentin - this is a medication to help with nerve based pain, take on a scheduled basis ?Zofran - take as needed for nausea ? ?- Continue taking your daily pain prescription as prescribed by your pain management provider ? ?FOLLOW-UP ?If you develop a Fever (?101.5), Redness or Drainage from the surgical incision site, please call our office to arrange for an evaluation. ?Please call the office to schedule a follow-up appointment. 10-14 days post-operatively. ? ? ? ?HELPFUL INFORMATION ? ?If you had a block, it will wear off between 8-24 hrs postop typically.  This is period when your pain may go from nearly zero to the pain you would have had postop without the block.  This is an abrupt transition but nothing dangerous is happening.  You may take an extra dose of narcotic when this happens. ? ?You may be more comfortable sleeping in a semi-seated position the first few nights following surgery.  Keep a pillow propped under the elbow and forearm for comfort.  If you have a recliner type of chair it might be beneficial.  If not that is fine too, but it would be helpful to sleep propped up with pillows behind your operated shoulder as well under your elbow and forearm.  This will reduce pulling on the suture lines. ? ?When dressing, put your operative arm in the sleeve first.  When getting undressed, take your operative arm out last.  Loose fitting, button-down shirts are recommended.  Often in the first days after surgery you may be more comfortable keeping your operative arm under your shirt and not through the sleeve. ? ?You may return to work/school in the next couple of days when you feel up to it.  Desk work and typing in the sling is fine. ? ?We suggest you use the pain medication the first night prior to going to bed, in order to ease any pain when the anesthesia wears off. You should avoid taking pain medications on an empty  stomach as it will make you nauseous. ? ?You should wean off your narcotic medicines as soon as you are able.  Most patients will be off or using minimal narcotics before their first postop appointment.  ? ?Do not drink alcoholic beverages or take illicit drugs when taking pain medications. ? ?It is against the law to drive while taking narcotics.  In some states it is against the law to drive while your arm is in a sling.  ? ?Pain medication may make you constipated.  Below are a few solutions to try in this order: ?Decrease the amount of pain medication if you aren't having pain. ?Drink lots of decaffeinated fluids. ?Drink prune juice and/or eat dried prunes ? ?If the first 3 don't work start with additional solutions ?Take Colace - an over-the-counter stool softener ?Take Senokot - an over-the-counter laxative ?Take Miralax - a stronger over-the-counter laxative ? ?For more information including helpful videos and documents visit our website:  ? ?[email or web address]-information.[email or web address] ? ? ? ? ?Post Anesthesia Home Care Instructions ? ?Activity: ?Get plenty of rest for the remainder of the day. A responsible individual must stay with you for 24 hours following the procedure.  ?For the next 24 hours, DO NOT: ?-Drive a car ?-Paediatric nurse ?-Drink alcoholic beverages ?-Take any medication unless instructed by your physician ?-Make any legal decisions or sign important papers. ? ?Meals: ?Start with liquid foods such as gelatin or soup. Progress to regular foods as tolerated. Avoid greasy, spicy, heavy foods. If nausea and/or vomiting occur, drink only clear liquids until the nausea and/or vomiting subsides. Call your physician if vomiting continues. ? ?Special Instructions/Symptoms: ?Your throat may feel dry or sore from the anesthesia or the breathing tube placed in your throat during surgery. If this causes discomfort, gargle with warm salt water. The discomfort should disappear within 24  hours. ? ?If you had a scopolamine patch placed behind your ear for the management of post- operative nausea and/or vomiting: ? ?1. The medication in the patch is effective for 72 hours, after which it should be removed.  Wrap patch in a tissue and discard in the trash. Wash hands thoroughly with soap and water. ?2. You may remove the patch earlier than 72 hours if you experience unpleasant side effects which may include dry mouth, dizziness or visual disturbances. ?3. Avoid touching the patch. Wash your hands with soap and water after contact with the patch. ? ?Regional Anesthesia Blocks ? ?1. Numbness or the inability to move the "blocked" extremity may last from 3-48 hours after placement. The length of time depends on the medication injected and your individual response to the medication. If the numbness is not going away after 48 hours, call your surgeon. ? ?2. The extremity that is blocked will need to be protected until the numbness is gone and the  Strength has returned. Because you cannot feel it, you will need to take extra care to avoid injury. Because it may be weak, you may  have difficulty moving it or using it. You may not know what position it is in without looking at it while the block is in effect. ? ?3. For blocks in the legs and feet, returning to weight bearing and walking needs to be done carefully. You will need to wait until the numbness is entirely gone and the strength has returned. You should be able to move your leg and foot normally before you try and bear weight or walk. You will need someone to be with you when you first try to ensure you do not fall and possibly risk injury. ? ?4. Bruising and tenderness at the needle site are common side effects and will resolve in a few days. ? ?5. Persistent numbness or new problems with movement should be communicated to the surgeon or the [place] [phone] [place] [phone]).  ? ?Information for Discharge  Teaching: ?EXPAREL (bupivacaine liposome injectable suspension)  ? ?Your surgeon or anesthesiologist gave you EXPAREL(bupivacaine) to help control your pain after surgery.  ?EXPAREL is a local anesthetic that provides pain relief

## 2022-01-26 NOTE — Progress Notes (Signed)
Assisted Dr. Edmond Fitzgerald with left, interscalene , ultrasound guided block. Side rails up, monitors on throughout procedure. See vital signs in flow sheet. Tolerated Procedure well. 

## 2022-01-27 MED ORDER — HYDROMORPHONE HCL 4 MG PO TABS: 4.0000 mg | ORAL_TABLET | Freq: Three times a day (TID) | ORAL | 0 refills | Status: AC | PRN

## 2022-01-27 NOTE — Addendum Note (Signed)
Addended by: Silverio Decamp on: 01/27/2022 03:08 PM ? ? Modules accepted: Orders ? ?

## 2022-01-30 ENCOUNTER — Encounter (HOSPITAL_BASED_OUTPATIENT_CLINIC_OR_DEPARTMENT_OTHER): Payer: Self-pay | Admitting: Orthopaedic Surgery

## 2022-02-10 ENCOUNTER — Ambulatory Visit: Payer: Medicaid Other | Admitting: Physical Therapy

## 2022-02-16 ENCOUNTER — Ambulatory Visit: Payer: Medicaid Other | Attending: Orthopaedic Surgery

## 2022-02-16 DIAGNOSIS — M25612 Stiffness of left shoulder, not elsewhere classified: Secondary | ICD-10-CM | POA: Insufficient documentation

## 2022-02-16 DIAGNOSIS — M25512 Pain in left shoulder: Secondary | ICD-10-CM | POA: Insufficient documentation

## 2022-02-16 NOTE — Therapy (Signed)
[place] [facility] ?[address] ?[place], [place], [address] ?Phone: [phone]   Fax:  [phone] ? ?Physical Therapy Evaluation ? ?Patient Details  ?Name: Patricia Carrillo ?MRN: [id number] ?Date of Birth: 26-Dec-1965 ?Referring Provider (PT): Everardo Pacific, MD ? ? ?Encounter Date: 02/16/2022 ? ? PT End of Session - 02/16/22 0900   ? ? Visit Number 1   ? Number of Visits 16   ? Date for PT Re-Evaluation 05/05/22   ? PT Start Time 0900   ? PT Stop Time 0930   ? PT Time Calculation (min) 30 min   ? Activity Tolerance Patient tolerated treatment well   ? Behavior During Therapy [facility] for tasks assessed/performed   ? ?  ?  ? ?  ? ? ?Past Medical History:  ?Diagnosis Date  ? Anxiety   ? Chronic kidney disease   ? "stage 3" per pt  ? Fatigue   ? Headache   ? Hypertension   ? Obesity 01/16/2022  ? takes metformin and ozempic "for weight loss, not diabetic"  ? Sleep apnea   ? dx 15 years ago, never used CPAP  ? ? ?Past Surgical History:  ?Procedure Laterality Date  ? CHOLECYSTECTOMY    ? LAPAROSCOPIC HYSTERECTOMY    ? SHOULDER ARTHROSCOPY WITH DISTAL CLAVICLE RESECTION Left 01/26/2022  ? Procedure: SHOULDER ARTHROSCOPY WITH DISTAL CLAVICLE EXCISION/ DEBRIDEMENT;  Surgeon: Bjorn Pippin, MD;  Location: [facility];  Service: Orthopedics;  Laterality: Left;  ? SHOULDER ARTHROSCOPY WITH SUBACROMIAL DECOMPRESSION, ROTATOR CUFF REPAIR AND BICEP TENDON REPAIR Left 01/26/2022  ? Procedure: SHOULDER ARTHROSCOPY WITH SUBACROMIAL DECOMPRESSION, ROTATOR CUFF REPAIR;  Surgeon: Bjorn Pippin, MD;  Location: [facility];  Service: Orthopedics;  Laterality: Left;  ? TONSILLECTOMY    ? TUBAL LIGATION    ? ? ?There were no vitals filed for this visit. ? ? ? Subjective Assessment - 02/16/22 0902   ? ? Subjective Patient reports that she had surgery on her left shoulder to repair her rotator cuff on 01/26/22. She then fell on 02/08/22, but the x-ray did not show any fractures or damage to her  left shoulder. She has been not been using her left any and is only taking it out of the sling to change and shower. She notes that her pain is still keeping her up at night.   ? Limitations House hold activities;Lifting   ? Patient Stated Goals be able to use her arm and clean around her house   ? Currently in Pain? Yes   ? Pain Score 10-Worst pain ever   between 5/10-10/10  ? Pain Location Shoulder   ? Pain Orientation Left   ? Pain Descriptors / Indicators Burning;Throbbing   ? Pain Type Surgical pain   ? Pain Radiating Towards none   ? Pain Onset 1 to 4 weeks ago   ? Pain Frequency Constant   ? Pain Relieving Factors medication, ice   ? Effect of Pain on Daily Activities unable to use left arm   ? ?  ?  ? ?  ? ? ? ? ? [facility] PT Assessment - 02/16/22 0001   ? ?  ? Assessment  ? Medical Diagnosis Cuff repair   ? Referring Provider (PT) Everardo Pacific, MD   ? Onset Date/Surgical Date 01/26/22   ? Hand Dominance Right   ? Next MD Visit 03/09/22   ? Prior Therapy No   ?  ? Precautions  ? Precautions Shoulder   ? Type  of Shoulder Precautions Left rotator cuff repair   ? Shoulder Interventions Shoulder sling/immobilizer;At all times   ?  ? Restrictions  ? Weight Bearing Restrictions No   ?  ? Balance Screen  ? Has the patient fallen in the past 6 months Yes   ? How many times? 1   ? Has the patient had a decrease in activity level because of a fear of falling?  No   ? Is the patient reluctant to leave their home because of a fear of falling?  No   ?  ? Home Environment  ? Living Environment Private residence   ? Living Arrangements Children;Other relatives   ?  ? Prior Function  ? Level of Independence Independent   ? Vocation Retired   ? Leisure cleaning, housework, Pharmacologistlaundry, shopping   ?  ? Cognition  ? Overall Cognitive Status Within Functional Limits for tasks assessed   ? Attention Focused   ? Focused Attention Appears intact   ? Memory Appears intact   ? Awareness Appears intact   ? Problem Solving Appears intact   ?  ?  Observation/Other Assessments  ? Other Surveys  Quick Dash   ? Quick DASH  79.55   ?  ? Sensation  ? Additional Comments Patient reports no numbness or tingling   ?  ? ROM / Strength  ? AROM / PROM / Strength AROM;PROM   ?  ? AROM  ? AROM Assessment Site Shoulder   ? Right/Left Shoulder Right   ? Right Shoulder Flexion 140 Degrees   ? Right Shoulder ABduction 114 Degrees   ? Right Shoulder Internal Rotation --   to L2  ? Right Shoulder External Rotation --   to T1  ?  ? PROM  ? PROM Assessment Site Shoulder   ? Right/Left Shoulder Left   ? Left Shoulder Flexion 30 Degrees   limited by sharp pain  ? Left Shoulder ABduction 63 Degrees   limited by sharp pain  ? Left Shoulder External Rotation 60 Degrees   limited by sharp pain  ?  ? Palpation  ? Palpation comment TTP: left subscapularis, supraspinatus, infraspinatus, upper trapezius, and deltoid   ? ?  ?  ? ?  ? ? ? ? ? ? ? ? ? ? ? ? ? ?Objective measurements completed on examination: See above findings.  ? ? ? ? ? ? ? ? ? ? ? ? ? ? ? ? PT Short Term Goals - 02/16/22 1002   ? ?  ? PT SHORT TERM GOAL #1  ? Title Patient will be independent with her initial HEP.   ? Time 4   ? Period Weeks   ? Status New   ? Target Date 03/16/22   ?  ? PT SHORT TERM GOAL #2  ? Title Patient will be able to demonstrate at least 120 degrees of passive left shoulder flexion.   ? Time 4   ? Period Weeks   ? Status New   ? Target Date 03/16/22   ?  ? PT SHORT TERM GOAL #3  ? Title Patient will be able to demonstrate at least 120 degrees of passive left shoulder abduction.   ? Time 4   ? Period Weeks   ? Status New   ? Target Date 03/16/22   ? ?  ?  ? ?  ? ? ? ? PT Long Term Goals - 02/16/22 1000   ? ?  ? PT LONG TERM  GOAL #1  ? Title Patient will be independent with her advanced HEP.   ? Time 8   ? Period Weeks   ? Status New   ? Target Date 04/13/22   ?  ? PT LONG TERM GOAL #2  ? Title Patient will be able to demonstrate at least 120 degrees of active left shoulder flexion for improved  function reaching overhead.   ? Time 8   ? Period Weeks   ? Status New   ? Target Date 04/13/22   ?  ? PT LONG TERM GOAL #3  ? Title Patient will be able to demonstrate at least 120 degrees of active left shoulder abduction for improved function with overhead activities.   ? Time 8   ? Period Weeks   ? Status New   ? Target Date 04/13/22   ?  ? PT LONG TERM GOAL #4  ? Title Patient will be able to complete her daily activities without her pain exceeding 5/10.   ? Time 8   ? Period Weeks   ? Status New   ? Target Date 04/13/22   ? ?  ?  ? ?  ? ? ? ? ? ? ? ? ? Plan - 02/16/22 0941   ? ? Clinical Impression Statement Patient is a 56 year old female presenting to physical therapy following a left rotator cuff repair on 01/26/22. She presented with high pain severity and moderate irritability. However, she notes that her pain severity has increased significantly since her fall at home on 02/08/22. She was the most limited with passive left shoulder flexion due to increased pain. She required cueing with today's PROM assessments to avoid muscle guarding and rotator cuff engagement. Recommend that she continue with skilled physical therapy to address her remaining impairments to return to her prior level of function.   ? Personal Factors and Comorbidities Other;Comorbidity 1   ? Comorbidities OA   ? Examination-Activity Limitations Reach Overhead;Carry;Sleep;Dressing;Hygiene/Grooming;Lift   ? Examination-Participation Restrictions [employer];Shop;Driving;Community Activity;Cleaning   ? Stability/Clinical Decision Making Evolving/Moderate complexity   ? Clinical Decision Making Moderate   ? Rehab Potential Good   ? PT Frequency 2x / week   ? PT Duration 8 weeks   ? PT Treatment/Interventions ADLs/Self Care Home Management;Neuromuscular re-education;Therapeutic exercise;Therapeutic activities;Patient/family education;Manual techniques;Passive range of motion   ? PT Next Visit Plan see protocol; pendulums, scapular  retraction; and PROM   ? Consulted and Agree with Plan of Care Patient   ? ?  ?  ? ?  ? ? ?Patient will benefit from skilled therapeutic intervention in order to improve the following deficits and impairments:  Decr

## 2022-02-20 ENCOUNTER — Ambulatory Visit: Payer: Medicaid Other

## 2022-02-20 DIAGNOSIS — M25512 Pain in left shoulder: Secondary | ICD-10-CM

## 2022-02-20 DIAGNOSIS — M25612 Stiffness of left shoulder, not elsewhere classified: Secondary | ICD-10-CM

## 2022-02-20 NOTE — Therapy (Signed)
[place] [facility] ?[address] ?[place], [place], [address] ?Phone: [phone]   Fax:  [phone] ? ?Physical Therapy Treatment ? ?Patient Details  ?Name: Patricia MaltaShelley Carrillo ?MRN: [id number] ?Date of Birth: 1966-07-02 ?Referring Provider (PT): Everardo PacificVarkey, MD ? ? ?Encounter Date: 02/20/2022 ? ? PT End of Session - 02/20/22 0907   ? ? Visit Number 2   ? Number of Visits 16   ? Date for PT Re-Evaluation 05/05/22   ? Authorization - Number of Visits 13    ? PT Start Time [phone]   ? PT Stop Time [phone]   ? PT Time Calculation (min) 40 min   ? Activity Tolerance Patient tolerated treatment well   ? [facility] for tasks assessed/performed   ? ?  ?  ? ?  ? ? ?Past Medical History:  ?Diagnosis Date  ? Anxiety   ? Chronic kidney disease   ? "stage 3" per pt  ? Fatigue   ? Headache   ? Hypertension   ? Obesity 01/16/2022  ? takes metformin and ozempic "for weight loss, not diabetic"  ? Sleep apnea   ? dx 15 years ago, never used CPAP  ? ? ?Past Surgical History:  ?Procedure Laterality Date  ? CHOLECYSTECTOMY    ? LAPAROSCOPIC HYSTERECTOMY    ? SHOULDER ARTHROSCOPY WITH DISTAL CLAVICLE RESECTION Left 01/26/2022  ? Procedure: SHOULDER ARTHROSCOPY WITH DISTAL CLAVICLE EXCISION/ DEBRIDEMENT;  Surgeon: Bjorn PippinVarkey, Dax T, MD;  Location: [facility];  Service: Orthopedics;  Laterality: Left;  ? SHOULDER ARTHROSCOPY WITH SUBACROMIAL DECOMPRESSION, ROTATOR CUFF REPAIR AND BICEP TENDON REPAIR Left 01/26/2022  ? Procedure: SHOULDER ARTHROSCOPY WITH SUBACROMIAL DECOMPRESSION, ROTATOR CUFF REPAIR;  Surgeon: Bjorn PippinVarkey, Dax T, MD;  Location: [facility];  Service: Orthopedics;  Laterality: Left;  ? TONSILLECTOMY    ? TUBAL LIGATION    ? ? ?There were no vitals filed for this visit. ? ? Subjective Assessment - 02/20/22 0904   ? ? Subjective Patient reports that her shoulder is hurting and a little sore this morning. She has not had any problems with her shoulder since her last  appointment.   ? Limitations House hold activities;Lifting   ? Patient Stated Goals be able to use her arm and clean around her house   ? Currently in Pain? Yes   ? Pain Score 6    ? Pain Location Shoulder   ? Pain Orientation Left   ? Pain Onset 1 to 4 weeks ago   ? ?  ?  ? ?  ? ? ? ? ? ? ? ? ? ? ? ? ? ? ? ? ? ? ? ? [facility] Adult PT Treatment/Exercise - 02/20/22 0001   ? ?  ? Exercises  ? Exercises Shoulder   ?  ? Shoulder Exercises: Seated  ? Retraction Both;20 reps   ?  ? Shoulder Exercises: ROM/Strengthening  ? Pendulum AP; 3 minutes   ?  ? Manual Therapy  ? Manual Therapy Soft tissue mobilization;Passive ROM   ? Soft tissue mobilization left rotator cuff   ? Passive ROM all planes to tolerance   ? ?  ?  ? ?  ? ? ? ? ? ? ? ? ? ? PT Education - 02/20/22 0944   ? ? Education Details healing, wearing her sling, HEP   ? Person(s) Educated Patient   ? Methods Explanation   ? Comprehension Verbalized understanding   ? ?  ?  ? ?  ? ? ?  PT Short Term Goals - 02/16/22 1002   ? ?  ? PT SHORT TERM GOAL #1  ? Title Patient will be independent with her initial HEP.   ? Time 4   ? Period Weeks   ? Status New   ? Target Date 03/16/22   ?  ? PT SHORT TERM GOAL #2  ? Title Patient will be able to demonstrate at least 120 degrees of passive left shoulder flexion.   ? Time 4   ? Period Weeks   ? Status New   ? Target Date 03/16/22   ?  ? PT SHORT TERM GOAL #3  ? Title Patient will be able to demonstrate at least 120 degrees of passive left shoulder abduction.   ? Time 4   ? Period Weeks   ? Status New   ? Target Date 03/16/22   ? ?  ?  ? ?  ? ? ? ? PT Long Term Goals - 02/16/22 1000   ? ?  ? PT LONG TERM GOAL #1  ? Title Patient will be independent with her advanced HEP.   ? Time 8   ? Period Weeks   ? Status New   ? Target Date 04/13/22   ?  ? PT LONG TERM GOAL #2  ? Title Patient will be able to demonstrate at least 120 degrees of active left shoulder flexion for improved function reaching overhead.   ? Time 8   ? Period Weeks   ?  Status New   ? Target Date 04/13/22   ?  ? PT LONG TERM GOAL #3  ? Title Patient will be able to demonstrate at least 120 degrees of active left shoulder abduction for improved function with overhead activities.   ? Time 8   ? Period Weeks   ? Status New   ? Target Date 04/13/22   ?  ? PT LONG TERM GOAL #4  ? Title Patient will be able to complete her daily activities without her pain exceeding 5/10.   ? Time 8   ? Period Weeks   ? Status New   ? Target Date 04/13/22   ? ?  ?  ? ?  ? ? ? ? ? ? ? ? Plan - 02/20/22 0912   ? ? Clinical Impression Statement Patient was introduced to scapular retractions and pendulums for light periscapular engagement and PROM. She required moderate cueing with these interventions to prevent rotator cuff engagement. Manual therapy focused on soft tissue mobilization to the left rotator cuff followed by PROM. She reported that her shoulder felt a little better after manual therapy. She continues to require skilled physical therapy to address her remaining impairments to return to her prior level of function.   ? Personal Factors and Comorbidities Other;Comorbidity 1   ? Comorbidities OA   ? Examination-Activity Limitations Reach Overhead;Carry;Sleep;Dressing;Hygiene/Grooming;Lift   ? Examination-Participation Restrictions [employer];Shop;Driving;Community Activity;Cleaning   ? Stability/Clinical Decision Making Evolving/Moderate complexity   ? Rehab Potential Good   ? PT Frequency 2x / week   ? PT Duration 8 weeks   ? PT Treatment/Interventions ADLs/Self Care Home Management;Neuromuscular re-education;Therapeutic exercise;Therapeutic activities;Patient/family education;Manual techniques;Passive range of motion   ? PT Next Visit Plan see protocol; pendulums, scapular retraction; and PROM   ? Consulted and Agree with Plan of Care Patient   ? ?  ?  ? ?  ? ? ?Patient will benefit from skilled therapeutic intervention in order to improve the following deficits and impairments:  Decreased range of motion, Decreased activity tolerance, Pain, Decreased strength, Decreased mobility, Impaired UE functional use ? ?Visit Diagnosis: ?Acute pain of left shoulder ? ?Stiffness of left shoulder, not elsewhere classified ? ? ? ? ?Problem List ?Patient Active Problem List  ? Diagnosis Date Noted  ? Lumbar degenerative disc disease 11/30/2021  ? Impingement syndrome, shoulder, left 03/14/2021  ? Primary osteoarthritis of both knees 03/14/2021  ? ? ?Granville Lewis, PT ?02/20/2022, 12:20 PM ? ?[place] [facility] ?[address] ?[place], [place], [address] ?Phone: [phone]   Fax:  [phone] ? ?Name: Patricia Carrillo ?MRN: [id number] ?Date of Birth: January 04, 1966 ? ? ? ?

## 2022-02-24 ENCOUNTER — Encounter: Payer: Self-pay | Admitting: Physical Therapy

## 2022-02-24 ENCOUNTER — Ambulatory Visit: Payer: Medicaid Other | Admitting: Physical Therapy

## 2022-02-24 DIAGNOSIS — M25512 Pain in left shoulder: Secondary | ICD-10-CM | POA: Diagnosis not present

## 2022-02-24 DIAGNOSIS — M25612 Stiffness of left shoulder, not elsewhere classified: Secondary | ICD-10-CM

## 2022-02-24 NOTE — Therapy (Signed)
[facility] [address] [place], [place], [address] Phone: [phone]   Fax:  [phone]  Physical Therapy Treatment  Patient Details  Name: Patricia Carrillo MRN: [id number] Date of Birth: February 03, 1966 Referring Provider (PT): Everardo Pacific, MD   Encounter Date: 02/24/2022   PT End of Session - 02/24/22 0858     Visit Number 3    Number of Visits 16    Date for PT Re-Evaluation 05/05/22    PT Start Time 0903    PT Stop Time 0928    PT Time Calculation (min) 25 min    Activity Tolerance Patient limited by pain    Behavior During Therapy [facility] for tasks assessed/performed             Past Medical History:  Diagnosis Date   Anxiety    Chronic kidney disease    "stage 3" per pt   Fatigue    Headache    Hypertension    Obesity 01/16/2022   takes metformin and ozempic "for weight loss, not diabetic"   Sleep apnea    dx 15 years ago, never used CPAP    Past Surgical History:  Procedure Laterality Date   CHOLECYSTECTOMY     LAPAROSCOPIC HYSTERECTOMY     SHOULDER ARTHROSCOPY WITH DISTAL CLAVICLE RESECTION Left 01/26/2022   Procedure: SHOULDER ARTHROSCOPY WITH DISTAL CLAVICLE EXCISION/ DEBRIDEMENT;  Surgeon: Bjorn Pippin, MD;  Location: [facility];  Service: Orthopedics;  Laterality: Left;   SHOULDER ARTHROSCOPY WITH SUBACROMIAL DECOMPRESSION, ROTATOR CUFF REPAIR AND BICEP TENDON REPAIR Left 01/26/2022   Procedure: SHOULDER ARTHROSCOPY WITH SUBACROMIAL DECOMPRESSION, ROTATOR CUFF REPAIR;  Surgeon: Bjorn Pippin, MD;  Location: [facility];  Service: Orthopedics;  Laterality: Left;   TONSILLECTOMY     TUBAL LIGATION      There were no vitals filed for this visit.   Subjective Assessment - 02/24/22 0902     Subjective Reports that she is trying to get a refill for pain meds.    Limitations House hold activities;Lifting    Patient Stated Goals be able to use her arm and clean around her house    Currently  in Pain? Yes    Pain Score 6     Pain Location Shoulder    Pain Orientation Left    Pain Descriptors / Indicators Sore;Aching    Pain Type Surgical pain    Pain Onset 1 to 4 weeks ago    Pain Frequency Constant                [facility] PT Assessment - 02/24/22 0001       Assessment   Medical Diagnosis Cuff repair    Referring Provider (PT) Everardo Pacific, MD    Onset Date/Surgical Date 01/26/22    Hand Dominance Right    Next MD Visit 03/09/22    Prior Therapy No      Precautions   Precautions Shoulder    Type of Shoulder Precautions Left rotator cuff repair    Shoulder Interventions Shoulder sling/immobilizer;At all times                           [facility] Adult PT Treatment/Exercise - 02/24/22 0001       Manual Therapy   Manual Therapy Passive ROM    Passive ROM PROM of L shoulder into flex, ER/IR with gentle hold and oscillations to reduce guarding and pain  PT Short Term Goals - 02/16/22 1002       PT SHORT TERM GOAL #1   Title Patient will be independent with her initial HEP.    Time 4    Period Weeks    Status New    Target Date 03/16/22      PT SHORT TERM GOAL #2   Title Patient will be able to demonstrate at least 120 degrees of passive left shoulder flexion.    Time 4    Period Weeks    Status New    Target Date 03/16/22      PT SHORT TERM GOAL #3   Title Patient will be able to demonstrate at least 120 degrees of passive left shoulder abduction.    Time 4    Period Weeks    Status New    Target Date 03/16/22               PT Long Term Goals - 02/16/22 1000       PT LONG TERM GOAL #1   Title Patient will be independent with her advanced HEP.    Time 8    Period Weeks    Status New    Target Date 04/13/22      PT LONG TERM GOAL #2   Title Patient will be able to demonstrate at least 120 degrees of active left shoulder flexion for improved function reaching overhead.    Time 8    Period  Weeks    Status New    Target Date 04/13/22      PT LONG TERM GOAL #3   Title Patient will be able to demonstrate at least 120 degrees of active left shoulder abduction for improved function with overhead activities.    Time 8    Period Weeks    Status New    Target Date 04/13/22      PT LONG TERM GOAL #4   Title Patient will be able to complete her daily activities without her pain exceeding 5/10.    Time 8    Period Weeks    Status New    Target Date 04/13/22                   Plan - 02/24/22 1010     Clinical Impression Statement Patient presented today with greater pain in L shoulder as pain meds have run out and she is trying to get a new prescription fixed by the MD. Patient limited with PROM flexion due to guarding and pain. Intermittant facial grimacing noted with eccentric PROM flexion. PROM flexion stopped due to pain which brought patient to tears. Frequent oscillations provided to LUE to assist in controlling pain. No pain or discomfort reported or observed during PROM ER.    Personal Factors and Comorbidities Other;Comorbidity 1    Comorbidities OA    Examination-Activity Limitations Reach Overhead;Carry;Sleep;Dressing;Hygiene/Grooming;Lift    Examination-Participation Restrictions Yard Work;Laundry;Shop;Driving;Community Activity;Cleaning    Stability/Clinical Decision Making Evolving/Moderate complexity    Rehab Potential Good    PT Frequency 2x / week    PT Duration 8 weeks    PT Treatment/Interventions ADLs/Self Care Home Management;Neuromuscular re-education;Therapeutic exercise;Therapeutic activities;Patient/family education;Manual techniques;Passive range of motion    PT Next Visit Plan see protocol; pendulums, scapular retraction; and PROM    Consulted and Agree with Plan of Care Patient             Patient will benefit from skilled therapeutic intervention in order to improve the  following deficits and impairments:  Decreased range of motion,  Decreased activity tolerance, Pain, Decreased strength, Decreased mobility, Impaired UE functional use  Visit Diagnosis: Acute pain of left shoulder  Stiffness of left shoulder, not elsewhere classified     Problem List Patient Active Problem List   Diagnosis Date Noted   Lumbar degenerative disc disease 11/30/2021   Impingement syndrome, shoulder, left 03/14/2021   Primary osteoarthritis of both knees 03/14/2021    Marvell Fuller, PTA 02/24/2022, 10:22 AM  [facility] [address] [place], [place], [address] Phone: [phone]   Fax:  [phone]  Name: Patricia Carrillo MRN: [id number] Date of Birth: 07/13/1966

## 2022-02-27 ENCOUNTER — Ambulatory Visit: Payer: Medicaid Other | Admitting: Sports Medicine

## 2022-02-27 DIAGNOSIS — M7542 Impingement syndrome of left shoulder: Secondary | ICD-10-CM | POA: Diagnosis not present

## 2022-02-27 DIAGNOSIS — M170 Bilateral primary osteoarthritis of knee: Secondary | ICD-10-CM | POA: Diagnosis not present

## 2022-02-27 DIAGNOSIS — M5136 Other intervertebral disc degeneration, lumbar region: Secondary | ICD-10-CM | POA: Diagnosis not present

## 2022-02-27 DIAGNOSIS — M51369 Other intervertebral disc degeneration, lumbar region without mention of lumbar back pain or lower extremity pain: Secondary | ICD-10-CM

## 2022-02-27 NOTE — Assessment & Plan Note (Signed)
Bilateral knee osteoarthritis, post partial meniscectomy, she had some steroid injections, has not had Visco. She did see an orthopedic surgeon who suggested knee arthroplasty but that she could not get it because of her BMI. It sounds like she is on Ozempic with her PCP but only on 0.5 mg, I have suggested she talk to them about ramping her up to the maximum dose to accelerate her weight loss. Weight loss will certainly help her knees, and her back.

## 2022-02-27 NOTE — Progress Notes (Signed)
    Procedures performed today:    None.  Independent interpretation of notes and tests performed by another provider:   None.  Brief History, Exam, Impression, and Recommendations:    Lumbar degenerative disc disease This is a very pleasant 56 year old female, longstanding axial low back pain, with radicular pain down the left leg in an L5 distribution, has not yet had her epidural due to her recent shoulder surgery. She will see me back approximately a month after her injection, in the meantime she will continue with her pain management for oxycodone and continue her gabapentin 800 twice daily.  Primary osteoarthritis of both knees Bilateral knee osteoarthritis, post partial meniscectomy, she had some steroid injections, has not had Visco. She did see an orthopedic surgeon who suggested knee arthroplasty but that she could not get it because of her BMI. It sounds like she is on Ozempic with her PCP but only on 0.5 mg, I have suggested she talk to them about ramping her up to the maximum dose to accelerate her weight loss. Weight loss will certainly help her knees, and her back.  Impingement syndrome, shoulder, left Now postop and healing.  I spent 30 minutes of total time managing this patient today, this includes chart review, face to face, and non-face to face time.  ___________________________________________ Ihor Austin. Benjamin Stain, M.D., ABFM., CAQSM. Primary Care and Sports Medicine [facility]  Adjunct Instructor of Family Medicine  [employer]

## 2022-02-27 NOTE — Assessment & Plan Note (Signed)
This is a very pleasant 56 year old female, longstanding axial low back pain, with radicular pain down the left leg in an L5 distribution, has not yet had her epidural due to her recent shoulder surgery. She will see me back approximately a month after her injection, in the meantime she will continue with her pain management for oxycodone and continue her gabapentin 800 twice daily.

## 2022-02-27 NOTE — Assessment & Plan Note (Signed)
Now postop and healing.

## 2022-02-28 ENCOUNTER — Ambulatory Visit: Payer: Medicaid Other | Admitting: *Deleted

## 2022-02-28 DIAGNOSIS — M25612 Stiffness of left shoulder, not elsewhere classified: Secondary | ICD-10-CM

## 2022-02-28 DIAGNOSIS — M25512 Pain in left shoulder: Secondary | ICD-10-CM

## 2022-02-28 NOTE — Therapy (Signed)
[facility] [address] [place], [place], [address] Phone: [phone]   Fax:  [phone]  Physical Therapy Treatment  Patient Details  Name: Patricia Carrillo MRN: [id number] Date of Birth: [birth year] Referring Provider (PT): Everardo Pacific, MD   Encounter Date: 02/28/2022   PT End of Session - 02/28/22 2831     Visit Number 4    Number of Visits 16    Date for PT Re-Evaluation 05/05/22    PT Start Time 0815    PT Stop Time 0835    PT Time Calculation (min) 20 min             Past Medical History:  Diagnosis Date   Anxiety    Chronic kidney disease    "stage 3" per pt   Fatigue    Headache    Hypertension    Obesity 01/16/2022   takes metformin and ozempic "for weight loss, not diabetic"   Sleep apnea    dx 15 years ago, never used CPAP    Past Surgical History:  Procedure Laterality Date   CHOLECYSTECTOMY     LAPAROSCOPIC HYSTERECTOMY     SHOULDER ARTHROSCOPY WITH DISTAL CLAVICLE RESECTION Left 01/26/2022   Procedure: SHOULDER ARTHROSCOPY WITH DISTAL CLAVICLE EXCISION/ DEBRIDEMENT;  Surgeon: Bjorn Pippin, MD;  Location: [facility];  Service: Orthopedics;  Laterality: Left;   SHOULDER ARTHROSCOPY WITH SUBACROMIAL DECOMPRESSION, ROTATOR CUFF REPAIR AND BICEP TENDON REPAIR Left 01/26/2022   Procedure: SHOULDER ARTHROSCOPY WITH SUBACROMIAL DECOMPRESSION, ROTATOR CUFF REPAIR;  Surgeon: Bjorn Pippin, MD;  Location: [facility];  Service: Orthopedics;  Laterality: Left;   TONSILLECTOMY     TUBAL LIGATION      There were no vitals filed for this visit.   Subjective Assessment - 02/28/22 0820     Subjective Finally got more pain meds. 8/10. F/U with MD 03-09-22    Limitations House hold activities;Lifting    Patient Stated Goals be able to use her arm and clean around her house    Currently in Pain? Yes    Pain Score 8     Pain Location Shoulder    Pain Orientation Left    Pain Descriptors /  Indicators Sore;Aching    Pain Onset 1 to 4 weeks ago                               [facility] Adult PT Treatment/Exercise - 02/28/22 0001       Manual Therapy   Manual Therapy Passive ROM    Manual therapy comments PROM flexion to 135 degrees and ER to 75 degrees    Passive ROM PROM of L shoulder into flex, ER/IR with gentle hold and oscillations to reduce guarding and pain                       PT Short Term Goals - 02/16/22 1002       PT SHORT TERM GOAL #1   Title Patient will be independent with her initial HEP.    Time 4    Period Weeks    Status New    Target Date 03/16/22      PT SHORT TERM GOAL #2   Title Patient will be able to demonstrate at least 120 degrees of passive left shoulder flexion.    Time 4    Period Weeks    Status New    Target  Date 03/16/22      PT SHORT TERM GOAL #3   Title Patient will be able to demonstrate at least 120 degrees of passive left shoulder abduction.    Time 4    Period Weeks    Status New    Target Date 03/16/22               PT Long Term Goals - 02/16/22 1000       PT LONG TERM GOAL #1   Title Patient will be independent with her advanced HEP.    Time 8    Period Weeks    Status New    Target Date 04/13/22      PT LONG TERM GOAL #2   Title Patient will be able to demonstrate at least 120 degrees of active left shoulder flexion for improved function reaching overhead.    Time 8    Period Weeks    Status New    Target Date 04/13/22      PT LONG TERM GOAL #3   Title Patient will be able to demonstrate at least 120 degrees of active left shoulder abduction for improved function with overhead activities.    Time 8    Period Weeks    Status New    Target Date 04/13/22      PT LONG TERM GOAL #4   Title Patient will be able to complete her daily activities without her pain exceeding 5/10.    Time 8    Period Weeks    Status New    Target Date 04/13/22                    Plan - 02/28/22 0858     Clinical Impression Statement Pt arrived today doing fairly well with lower pain levels LT shldr. PROM to LT shldr in supine for IR, ER and flexion. Many V/Cs to breathe and relax due to mm guarding. Pt did good with PROM flexion to 135 degrees and ER to 75 degrees.    Personal Factors and Comorbidities Other;Comorbidity 1    Comorbidities OA    Examination-Activity Limitations Reach Overhead;Carry;Sleep;Dressing;Hygiene/Grooming;Lift    Examination-Participation Restrictions Yard Work;Laundry;Shop;Driving;Community Activity;Cleaning    Stability/Clinical Decision Making Evolving/Moderate complexity    Rehab Potential Good    PT Frequency 2x / week    PT Duration 8 weeks    PT Treatment/Interventions ADLs/Self Care Home Management;Neuromuscular re-education;Therapeutic exercise;Therapeutic activities;Patient/family education;Manual techniques;Passive range of motion    PT Next Visit Plan see protocol; pendulums, scapular retraction; and PROM    Consulted and Agree with Plan of Care Patient             Patient will benefit from skilled therapeutic intervention in order to improve the following deficits and impairments:  Decreased range of motion, Decreased activity tolerance, Pain, Decreased strength, Decreased mobility, Impaired UE functional use  Visit Diagnosis: Acute pain of left shoulder  Stiffness of left shoulder, not elsewhere classified     Problem List Patient Active Problem List   Diagnosis Date Noted   Lumbar degenerative disc disease 11/30/2021   Impingement syndrome, shoulder, left 03/14/2021   Primary osteoarthritis of both knees 03/14/2021    Shamarra Warda,CHRIS, PTA 02/28/2022, 9:26 AM  [facility] [address] [place], [place], [address] Phone: [phone]   Fax:  [phone]  Name: Patricia Carrillo MRN: [id number] Date of Birth: [birth year]

## 2022-03-02 ENCOUNTER — Ambulatory Visit: Payer: Medicaid Other | Admitting: Physical Therapy

## 2022-03-02 ENCOUNTER — Encounter: Payer: Self-pay | Admitting: Physical Therapy

## 2022-03-02 DIAGNOSIS — M25612 Stiffness of left shoulder, not elsewhere classified: Secondary | ICD-10-CM

## 2022-03-02 DIAGNOSIS — M25512 Pain in left shoulder: Secondary | ICD-10-CM

## 2022-03-02 NOTE — Therapy (Signed)
[facility] [address] [place], [place], [address] Phone: [phone]   Fax:  [phone]  Physical Therapy Treatment  Patient Details  Name: Patricia MaltaShelley Iannello MRN: [id number] Date of Birth: Jul 05, 1966 Referring Provider (PT): Everardo PacificVarkey, MD   Encounter Date: 03/02/2022   PT End of Session - 03/02/22 1124     Visit Number 5    Number of Visits 16    Date for PT Re-Evaluation 05/05/22    PT Start Time 1124    PT Stop Time 1150    PT Time Calculation (min) 26 min    Activity Tolerance Patient limited by pain    Behavior During Therapy [facility] for tasks assessed/performed             Past Medical History:  Diagnosis Date   Anxiety    Chronic kidney disease    "stage 3" per pt   Fatigue    Headache    Hypertension    Obesity 01/16/2022   takes metformin and ozempic "for weight loss, not diabetic"   Sleep apnea    dx 15 years ago, never used CPAP    Past Surgical History:  Procedure Laterality Date   CHOLECYSTECTOMY     LAPAROSCOPIC HYSTERECTOMY     SHOULDER ARTHROSCOPY WITH DISTAL CLAVICLE RESECTION Left 01/26/2022   Procedure: SHOULDER ARTHROSCOPY WITH DISTAL CLAVICLE EXCISION/ DEBRIDEMENT;  Surgeon: Bjorn PippinVarkey, Dax T, MD;  Location: [facility];  Service: Orthopedics;  Laterality: Left;   SHOULDER ARTHROSCOPY WITH SUBACROMIAL DECOMPRESSION, ROTATOR CUFF REPAIR AND BICEP TENDON REPAIR Left 01/26/2022   Procedure: SHOULDER ARTHROSCOPY WITH SUBACROMIAL DECOMPRESSION, ROTATOR CUFF REPAIR;  Surgeon: Bjorn PippinVarkey, Dax T, MD;  Location:  [facility];  Service: Orthopedics;  Laterality: Left;   TONSILLECTOMY     TUBAL LIGATION      There were no vitals filed for this visit.   Subjective Assessment - 03/02/22 1123     Subjective Reports that she did get pain meds but has some pain still.    Limitations House hold activities;Lifting    Patient Stated Goals be able to use her arm and clean around her house     Currently in Pain? Yes    Pain Score 4     Pain Location Shoulder    Pain Orientation Left    Pain Descriptors / Indicators Discomfort;Sore    Pain Type Surgical pain    Pain Onset More than a month ago    Pain Frequency Constant                [facility] PT Assessment - 03/02/22 0001       Assessment   Medical Diagnosis Cuff repair    Referring Provider (PT) Everardo PacificVarkey, MD    Onset Date/Surgical Date 01/26/22    Hand Dominance Right    Next MD Visit 03/09/22    Prior Therapy No      Precautions   Precautions Shoulder    Type of Shoulder Precautions Left rotator cuff repair    Shoulder Interventions Shoulder sling/immobilizer;At all times                           [facility] - 03/02/22 0001       Manual Therapy   Manual Therapy Passive ROM    Passive ROM PROM of L shoulder into flex, ER/IR with gentle hold and oscillations to reduce guarding and pain  PT Short Term Goals - 02/16/22 1002       PT SHORT TERM GOAL #1   Title Patient will be independent with her initial HEP.    Time 4    Period Weeks    Status New    Target Date 03/16/22      PT SHORT TERM GOAL #2   Title Patient will be able to demonstrate at least 120 degrees of passive left shoulder flexion.    Time 4    Period Weeks    Status New    Target Date 03/16/22      PT SHORT TERM GOAL #3   Title Patient will be able to demonstrate at least 120 degrees of passive left shoulder abduction.    Time 4    Period Weeks    Status New    Target Date 03/16/22               PT Long Term Goals - 02/16/22 1000       PT LONG TERM GOAL #1   Title Patient will be independent with her advanced HEP.    Time 8    Period Weeks    Status New    Target Date 04/13/22      PT LONG TERM GOAL #2   Title Patient will be able to demonstrate at least 120 degrees of active left shoulder flexion for improved function reaching overhead.     Time 8    Period Weeks    Status New    Target Date 04/13/22      PT LONG TERM GOAL #3   Title Patient will be able to demonstrate at least 120 degrees of active left shoulder abduction for improved function with overhead activities.    Time 8    Period Weeks    Status New    Target Date 04/13/22      PT LONG TERM GOAL #4   Title Patient will be able to complete her daily activities without her pain exceeding 5/10.    Time 8    Period Weeks    Status New    Target Date 04/13/22                   Plan - 03/02/22 1204     Clinical Impression Statement Patient presented in clinic with better pain control as she has her pain meds back. Patient able to tolerate PROM fairly well but intermittant reports of discomfort with eccentric L shoulder flexion. Firm end feels and smooth arc of motion noted during PROM of L shoulder. Intermittant oscillations promoted during PROM to assist with pain control.    Personal Factors and Comorbidities Other;Comorbidity 1    Comorbidities OA    Examination-Activity Limitations Reach Overhead;Carry;Sleep;Dressing;Hygiene/Grooming;Lift    Examination-Participation Restrictions Yard Work;Laundry;Shop;Driving;Community Activity;Cleaning    Stability/Clinical Decision Making Evolving/Moderate complexity    Rehab Potential Good    PT Frequency 2x / week    PT Duration 8 weeks    PT Treatment/Interventions ADLs/Self Care Home Management;Neuromuscular re-education;Therapeutic exercise;Therapeutic activities;Patient/family education;Manual techniques;Passive range of motion    PT Next Visit Plan see protocol; pendulums, scapular retraction; and PROM    Consulted and Agree with Plan of Care Patient             Patient will benefit from skilled therapeutic intervention in order to improve the following deficits and impairments:  Decreased range of motion, Decreased activity tolerance, Pain, Decreased strength, Decreased mobility, Impaired UE  functional  use  Visit Diagnosis: Acute pain of left shoulder  Stiffness of left shoulder, not elsewhere classified     Problem List Patient Active Problem List   Diagnosis Date Noted   Lumbar degenerative disc disease 11/30/2021   Impingement syndrome, shoulder, left 03/14/2021   Primary osteoarthritis of both knees 03/14/2021    Marvell Fuller, PTA 03/02/2022, 12:08 PM  [facility] [address] [place], [place], [address] Phone: [phone]   Fax:  [phone]  Name: Lynia Landry MRN: [id number] Date of Birth: [birth year]

## 2022-03-08 ENCOUNTER — Encounter: Payer: Self-pay | Admitting: Physical Therapy

## 2022-03-08 ENCOUNTER — Ambulatory Visit: Payer: Medicaid Other | Admitting: Physical Therapy

## 2022-03-08 DIAGNOSIS — M25512 Pain in left shoulder: Secondary | ICD-10-CM | POA: Diagnosis not present

## 2022-03-08 DIAGNOSIS — M25612 Stiffness of left shoulder, not elsewhere classified: Secondary | ICD-10-CM

## 2022-03-08 NOTE — Therapy (Signed)
[facility] [address] [place], [place], [address] Phone: [phone]   Fax:  [phone]  Physical Therapy Treatment  Patient Details  Name: Patricia Carrillo MRN: [id number] Date of Birth: Feb 15, 1966 Referring Provider (PT): Everardo Pacific, MD   Encounter Date: 03/08/2022   PT End of Session - 03/08/22 0941     Visit Number 6    Number of Visits 16    Date for PT Re-Evaluation 05/05/22    PT Start Time 0903    PT Stop Time 0929    PT Time Calculation (min) 26 min    Activity Tolerance Patient limited by pain;Patient tolerated treatment well    Behavior During Therapy [facility] for tasks assessed/performed             Past Medical History:  Diagnosis Date   Anxiety    Chronic kidney disease    "stage 3" per pt   Fatigue    Headache    Hypertension    Obesity 01/16/2022   takes metformin and ozempic "for weight loss, not diabetic"   Sleep apnea    dx 15 years ago, never used CPAP    Past Surgical History:  Procedure Laterality Date   CHOLECYSTECTOMY     LAPAROSCOPIC HYSTERECTOMY     SHOULDER ARTHROSCOPY WITH DISTAL CLAVICLE RESECTION Left 01/26/2022   Procedure: SHOULDER ARTHROSCOPY WITH DISTAL CLAVICLE EXCISION/ DEBRIDEMENT;  Surgeon: Bjorn Pippin, MD;  Location: [facility];  Service: Orthopedics;  Laterality: Left;   SHOULDER ARTHROSCOPY WITH SUBACROMIAL DECOMPRESSION, ROTATOR CUFF REPAIR AND BICEP TENDON REPAIR Left 01/26/2022   Procedure: SHOULDER ARTHROSCOPY WITH SUBACROMIAL DECOMPRESSION, ROTATOR CUFF REPAIR;  Surgeon: Bjorn Pippin, MD;  Location: [facility];  Service: Orthopedics;  Laterality: Left;   TONSILLECTOMY     TUBAL LIGATION      There were no vitals filed for this visit.   Subjective Assessment - 03/08/22 0939     Subjective Reports she has tried many different ways to stop L shoulder pain and deltoid region pain but to no avail. Has tried resting with no sling and with.     Limitations House hold activities;Lifting    Patient Stated Goals be able to use her arm and clean around her house    Currently in Pain? Yes    Pain Score 8     Pain Location Shoulder    Pain Orientation Left    Pain Descriptors / Indicators Discomfort    Pain Type Surgical pain    Pain Onset More than a month ago    Pain Frequency Constant                [facility] PT Assessment - 03/08/22 0001       Assessment   Medical Diagnosis Cuff repair    Referring Provider (PT) Everardo Pacific, MD    Onset Date/Surgical Date 01/26/22    Hand Dominance Right    Next MD Visit 03/09/22    Prior Therapy No      Precautions   Precautions Shoulder    Type of Shoulder Precautions Left rotator cuff repair    Shoulder Interventions Shoulder sling/immobilizer;At all times                           [facility] Adult PT Treatment/Exercise - 03/08/22 0001       Manual Therapy   Manual Therapy Passive ROM    Passive ROM PROM of L shoulder into  flex, ER/IR with gentle hold and oscillations to reduce guarding and pain                       PT Short Term Goals - 02/16/22 1002       PT SHORT TERM GOAL #1   Title Patient will be independent with her initial HEP.    Time 4    Period Weeks    Status New    Target Date 03/16/22      PT SHORT TERM GOAL #2   Title Patient will be able to demonstrate at least 120 degrees of passive left shoulder flexion.    Time 4    Period Weeks    Status New    Target Date 03/16/22      PT SHORT TERM GOAL #3   Title Patient will be able to demonstrate at least 120 degrees of passive left shoulder abduction.    Time 4    Period Weeks    Status New    Target Date 03/16/22               PT Long Term Goals - 02/16/22 1000       PT LONG TERM GOAL #1   Title Patient will be independent with her advanced HEP.    Time 8    Period Weeks    Status New    Target Date 04/13/22      PT LONG TERM GOAL #2   Title Patient will be able  to demonstrate at least 120 degrees of active left shoulder flexion for improved function reaching overhead.    Time 8    Period Weeks    Status New    Target Date 04/13/22      PT LONG TERM GOAL #3   Title Patient will be able to demonstrate at least 120 degrees of active left shoulder abduction for improved function with overhead activities.    Time 8    Period Weeks    Status New    Target Date 04/13/22      PT LONG TERM GOAL #4   Title Patient will be able to complete her daily activities without her pain exceeding 5/10.    Time 8    Period Weeks    Status New    Target Date 04/13/22                   Plan - 03/08/22 0942     Clinical Impression Statement Patient presented in clinic with reports of greater pain in L shoulder and deltoid region. Has tried with a sling as well as without but no reduction of pain. Patient more limited with PROM flexion due to pain and requires more rest periods with oscillations. No limitations with PROM ER and no pain reported. PROM ER measured as 60 deg. PROM flexion ranges from 90-100 deg.    Personal Factors and Comorbidities Other;Comorbidity 1    Comorbidities OA    Examination-Activity Limitations Reach Overhead;Carry;Sleep;Dressing;Hygiene/Grooming;Lift    Examination-Participation Restrictions Yard Work;Laundry;Shop;Driving;Community Activity;Cleaning    Stability/Clinical Decision Making Evolving/Moderate complexity    Rehab Potential Good    PT Frequency 2x / week    PT Duration 8 weeks    PT Treatment/Interventions ADLs/Self Care Home Management;Neuromuscular re-education;Therapeutic exercise;Therapeutic activities;Patient/family education;Manual techniques;Passive range of motion    PT Next Visit Plan see protocol; pendulums, scapular retraction; and PROM    Consulted and Agree with Plan of Care Patient  Patient will benefit from skilled therapeutic intervention in order to improve the following deficits  and impairments:  Decreased range of motion, Decreased activity tolerance, Pain, Decreased strength, Decreased mobility, Impaired UE functional use  Visit Diagnosis: Acute pain of left shoulder  Stiffness of left shoulder, not elsewhere classified     Problem List Patient Active Problem List   Diagnosis Date Noted   Lumbar degenerative disc disease 11/30/2021   Impingement syndrome, shoulder, left 03/14/2021   Primary osteoarthritis of both knees 03/14/2021    Marvell FullerKelsey P Tadarius Maland, PTA 03/08/2022, 10:10 AM  [facility] [address] [place], [place], [address] Phone: [phone]   Fax:  [phone]  Name: Patricia MaltaShelley Carrillo MRN: [id number] Date of Birth: 1965/10/29

## 2022-03-10 ENCOUNTER — Ambulatory Visit: Payer: Medicaid Other | Attending: Orthopaedic Surgery

## 2022-03-10 DIAGNOSIS — M25512 Pain in left shoulder: Secondary | ICD-10-CM | POA: Insufficient documentation

## 2022-03-10 DIAGNOSIS — M25612 Stiffness of left shoulder, not elsewhere classified: Secondary | ICD-10-CM | POA: Insufficient documentation

## 2022-03-10 NOTE — Therapy (Signed)
[facility] [address] [place], [place], [address] Phone: [phone]   Fax:  [phone]  Physical Therapy Treatment  Patient Details  Name: Patricia Carrillo MRN: [id number] Date of Birth: September 27, 1966 Referring Provider (PT): Everardo Pacific, MD   Encounter Date: 03/10/2022   PT End of Session - 03/10/22 0915     Visit Number 7    Number of Visits 16    Date for PT Re-Evaluation 05/05/22    PT Start Time 0909    PT Stop Time 0950    PT Time Calculation (min) 41 min    Activity Tolerance Patient limited by pain;Patient tolerated treatment well    Behavior During Therapy [facility] for tasks assessed/performed             Past Medical History:  Diagnosis Date   Anxiety    Chronic kidney disease    "stage 3" per pt   Fatigue    Headache    Hypertension    Obesity 01/16/2022   takes metformin and ozempic "for weight loss, not diabetic"   Sleep apnea    dx 15 years ago, never used CPAP    Past Surgical History:  Procedure Laterality Date   CHOLECYSTECTOMY     LAPAROSCOPIC HYSTERECTOMY     SHOULDER ARTHROSCOPY WITH DISTAL CLAVICLE RESECTION Left 01/26/2022   Procedure: SHOULDER ARTHROSCOPY WITH DISTAL CLAVICLE EXCISION/ DEBRIDEMENT;  Surgeon: Bjorn Pippin, MD;  Location: [facility];  Service: Orthopedics;  Laterality: Left;   SHOULDER ARTHROSCOPY WITH SUBACROMIAL DECOMPRESSION, ROTATOR CUFF REPAIR AND BICEP TENDON REPAIR Left 01/26/2022   Procedure: SHOULDER ARTHROSCOPY WITH SUBACROMIAL DECOMPRESSION, ROTATOR CUFF REPAIR;  Surgeon: Bjorn Pippin, MD;  Location: [facility];  Service: Orthopedics;  Laterality: Left;   TONSILLECTOMY     TUBAL LIGATION      There were no vitals filed for this visit.   Subjective Assessment - 03/10/22 0913     Subjective Pt arrives for today's treamtent session reporting 6/10 left shoulder pain.  Pt reports that she has been cleared from the sling at this time and encouraged to  move arm as able.  Pt arrives for today's treatment session 8 mins late.    Limitations House hold activities;Lifting    Patient Stated Goals be able to use her arm and clean around her house    Pain Onset More than a month ago                               [facility] Adult PT Treatment/Exercise - 03/10/22 0001       Shoulder Exercises: Supine   External Rotation AAROM;Left;15 reps   Theracane   Flexion AAROM;Both;15 reps   cane   Other Supine Exercises Chest press x 15 reps with cane      Shoulder Exercises: Pulleys   Flexion 5 minutes      Manual Therapy   Manual Therapy Passive ROM    Passive ROM PROM of L shoulder into flex, ER/IR with gentle hold and oscillations to reduce guarding and pain                       PT Short Term Goals - 02/16/22 1002       PT SHORT TERM GOAL #1   Title Patient will be independent with her initial HEP.    Time 4    Period Weeks    Status  New    Target Date 03/16/22      PT SHORT TERM GOAL #2   Title Patient will be able to demonstrate at least 120 degrees of passive left shoulder flexion.    Time 4    Period Weeks    Status New    Target Date 03/16/22      PT SHORT TERM GOAL #3   Title Patient will be able to demonstrate at least 120 degrees of passive left shoulder abduction.    Time 4    Period Weeks    Status New    Target Date 03/16/22               PT Long Term Goals - 02/16/22 1000       PT LONG TERM GOAL #1   Title Patient will be independent with her advanced HEP.    Time 8    Period Weeks    Status New    Target Date 04/13/22      PT LONG TERM GOAL #2   Title Patient will be able to demonstrate at least 120 degrees of active left shoulder flexion for improved function reaching overhead.    Time 8    Period Weeks    Status New    Target Date 04/13/22      PT LONG TERM GOAL #3   Title Patient will be able to demonstrate at least 120 degrees of active left shoulder abduction  for improved function with overhead activities.    Time 8    Period Weeks    Status New    Target Date 04/13/22      PT LONG TERM GOAL #4   Title Patient will be able to complete her daily activities without her pain exceeding 5/10.    Time 8    Period Weeks    Status New    Target Date 04/13/22                   Plan - 03/10/22 0916     Clinical Impression Statement Pt arrives for today's treatment session reporting 6/10 L shoulder pain and stiffness.  Pt cleared by MD to discontinue sling at this time.  Pt encouraged to wear sling in public to avoid injury/bumping.  Pt able to tolerate pulleys into flexion to increase ROM with min cues for proper technique.  Pt instructed in AA supine can exercises with min cues required for proper technique; especially ER to keep elbow in.  PROM performed to left shoulder in all directions to increased ROM and decrease tone.  Pt reported 3/10 L shoulder pain at completion of today's treatment session.    Personal Factors and Comorbidities Other;Comorbidity 1    Comorbidities OA    Examination-Activity Limitations Reach Overhead;Carry;Sleep;Dressing;Hygiene/Grooming;Lift    Examination-Participation Restrictions Yard Work;Laundry;Shop;Driving;Community Activity;Cleaning    Stability/Clinical Decision Making Evolving/Moderate complexity    Rehab Potential Good    PT Frequency 2x / week    PT Duration 8 weeks    PT Treatment/Interventions ADLs/Self Care Home Management;Neuromuscular re-education;Therapeutic exercise;Therapeutic activities;Patient/family education;Manual techniques;Passive range of motion    PT Next Visit Plan see protocol; pendulums, scapular retraction; and PROM    Consulted and Agree with Plan of Care Patient             Patient will benefit from skilled therapeutic intervention in order to improve the following deficits and impairments:  Decreased range of motion, Decreased activity tolerance, Pain, Decreased strength,  Decreased mobility, Impaired  UE functional use  Visit Diagnosis: Acute pain of left shoulder  Stiffness of left shoulder, not elsewhere classified     Problem List Patient Active Problem List   Diagnosis Date Noted   Lumbar degenerative disc disease 11/30/2021   Impingement syndrome, shoulder, left 03/14/2021   Primary osteoarthritis of both knees 03/14/2021   Rationale for Evaluation and Treatment Rehabilitation  Newman Pies, PTA 03/10/2022, 10:06 AM  [facility] [address] [place], [place], [address] Phone: [phone]   Fax:  [phone]  Name: Reginna Sermeno MRN: [id number] Date of Birth: 1965-12-10

## 2022-03-13 ENCOUNTER — Other Ambulatory Visit: Payer: Medicaid Other

## 2022-03-15 ENCOUNTER — Encounter: Payer: Medicaid Other | Admitting: Physical Therapy

## 2022-03-15 ENCOUNTER — Ambulatory Visit
Admission: RE | Admit: 2022-03-15 | Discharge: 2022-03-15 | Disposition: A | Discharge status: Home / Self Care | Payer: Medicaid Other | Source: Ambulatory Visit | Attending: Sports Medicine | Admitting: Sports Medicine

## 2022-03-15 ENCOUNTER — Other Ambulatory Visit: Payer: Medicaid Other

## 2022-03-15 DIAGNOSIS — M5136 Other intervertebral disc degeneration, lumbar region: Secondary | ICD-10-CM

## 2022-03-15 MED ORDER — IOPAMIDOL (ISOVUE-M 200) INJECTION 41%
1.0000 mL | Freq: Once | INTRAMUSCULAR | Status: AC
Start: 2022-03-15 — End: 2022-03-15
  Administered 2022-03-15: 1 mL via EPIDURAL

## 2022-03-15 MED ORDER — METHYLPREDNISOLONE ACETATE 40 MG/ML INJ SUSP (RADIOLOG
80.0000 mg | Freq: Once | INTRAMUSCULAR | Status: AC
  Administered 2022-03-15: 80 mg via EPIDURAL

## 2022-03-15 NOTE — Discharge Instructions (Signed)

## 2022-03-16 ENCOUNTER — Other Ambulatory Visit (HOSPITAL_COMMUNITY): Payer: Self-pay | Admitting: *Deleted

## 2022-03-16 ENCOUNTER — Encounter (HOSPITAL_COMMUNITY): Payer: Self-pay

## 2022-03-16 MED ORDER — METOPROLOL TARTRATE 50 MG PO TABS: ORAL_TABLET | ORAL | 0 refills | Status: DC

## 2022-03-17 ENCOUNTER — Telehealth (HOSPITAL_COMMUNITY): Payer: Self-pay | Admitting: *Deleted

## 2022-03-17 NOTE — Telephone Encounter (Signed)
Attempted to call patient regarding upcoming cardiac CT appointment. Called patient and she did not pick up the phone. My Chart message sent but patient has not read the message.  Larey Brick RN Navigator Cardiac Imaging [facility] [phone] Office [phone] Cell

## 2022-03-20 ENCOUNTER — Ambulatory Visit (HOSPITAL_COMMUNITY)
Admission: RE | Admit: 2022-03-20 | Discharge: 2022-03-20 | Disposition: A | Discharge status: Home / Self Care | Payer: Medicaid Other | Source: Ambulatory Visit | Attending: Family Medicine | Admitting: Family Medicine

## 2022-03-20 ENCOUNTER — Ambulatory Visit: Payer: Medicaid Other | Admitting: Physical Therapy

## 2022-03-20 ENCOUNTER — Encounter: Payer: Self-pay | Admitting: Physical Therapy

## 2022-03-20 DIAGNOSIS — I700 Atherosclerosis of aorta: Secondary | ICD-10-CM | POA: Diagnosis not present

## 2022-03-20 DIAGNOSIS — R0789 Other chest pain: Secondary | ICD-10-CM | POA: Insufficient documentation

## 2022-03-20 DIAGNOSIS — M25612 Stiffness of left shoulder, not elsewhere classified: Secondary | ICD-10-CM

## 2022-03-20 DIAGNOSIS — M25512 Pain in left shoulder: Secondary | ICD-10-CM | POA: Diagnosis not present

## 2022-03-20 LAB — POCT I-STAT CREATININE: Creatinine, Ser: 1.2 mg/dL — ABNORMAL HIGH (ref 0.44–1.00)

## 2022-03-20 IMAGING — CT CT HEART MORP W/ CTA COR W/ SCORE W/ CA W/CM &/OR W/O CM
4 of 7 series · 8 of 20 positions shown, 9 images · IV contrast (APPLIED)
Comparison: None Available.
COMPARISON: None Available.

Addendum:
EXAM:
OVER-READ INTERPRETATION  CT CHEST

The following report is a limited chest CT over-read performed by
[DATE]. The coronary calcium score and cardiac CTA interpretation
by the cardiologist is attached.
CLINICAL DATA: This is a 56 year old female with anginal symptoms.
Cardiac/Coronary  CTA
TECHNIQUE: The patient was scanned on a [employer] Force scanner.

[Series 6: ts syst sharp · axial · 0.39mm/px · z∈[+1343,+1373]mm · 2 of 229 slices shown]
[im 77/229  lung]
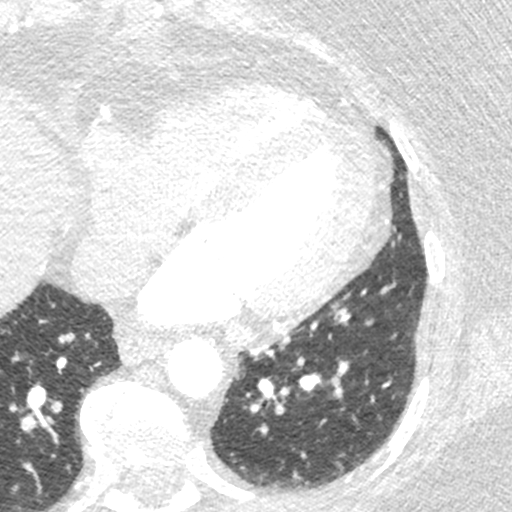
[im 153/229  lung]
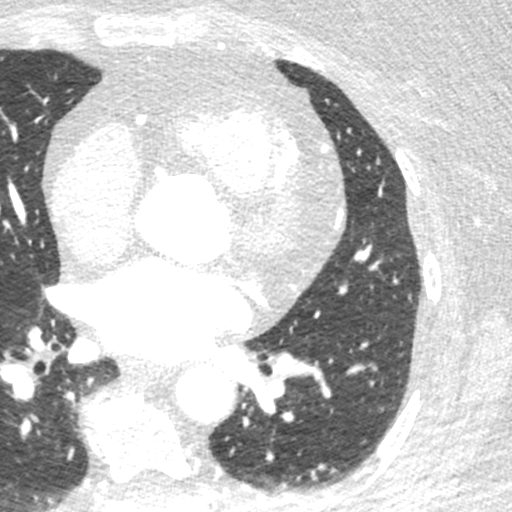

[Series 7: best syst · axial · 0.39mm/px · z∈[+1343,+1373]mm · 2 of 229 slices shown, 3 images]
[im 77/229  vessel]
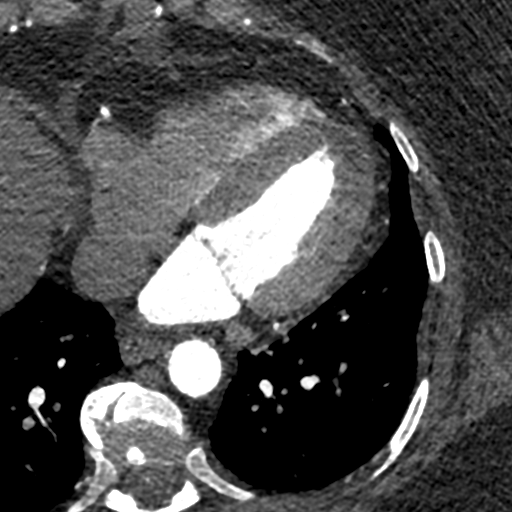
[im 77/229  lung]
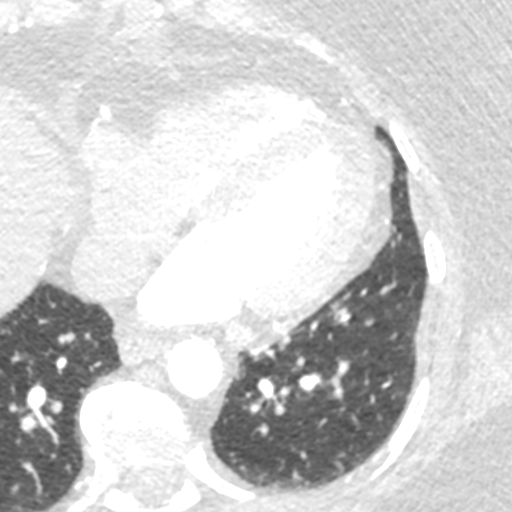
[im 153/229  vessel]
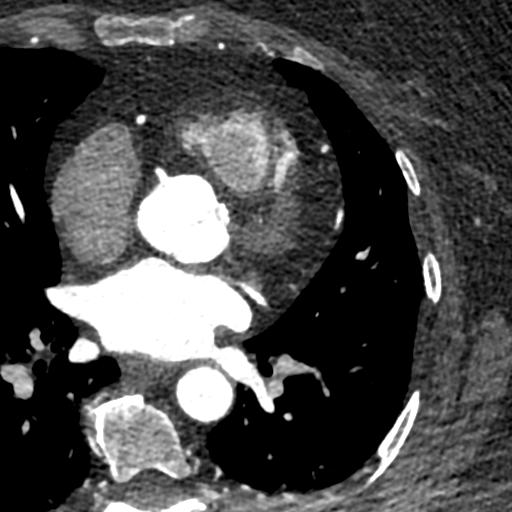

[Series 8: ts diast sharp · axial · 0.39mm/px · z∈[+1343,+1373]mm · 2 of 229 slices shown]
[im 77/229  lung]
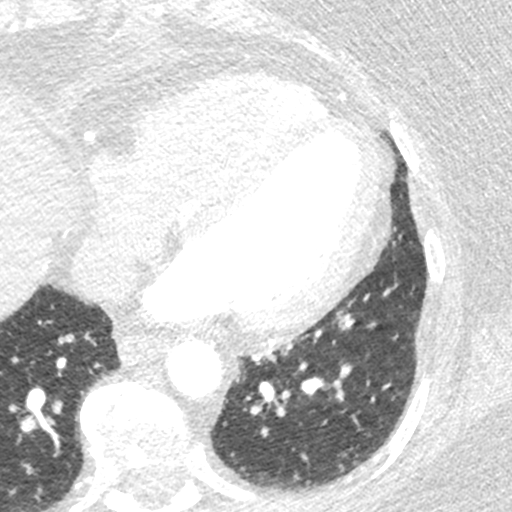
[im 153/229  lung]
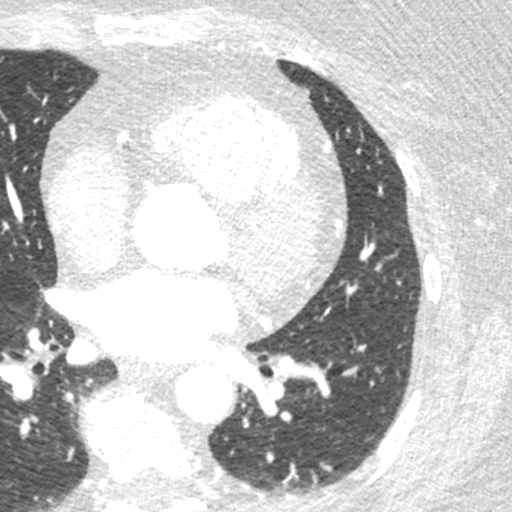

[Series 9: best diast · axial · 0.39mm/px · z∈[+1343,+1373]mm · 2 of 229 slices shown]
[im 77/229  vessel]
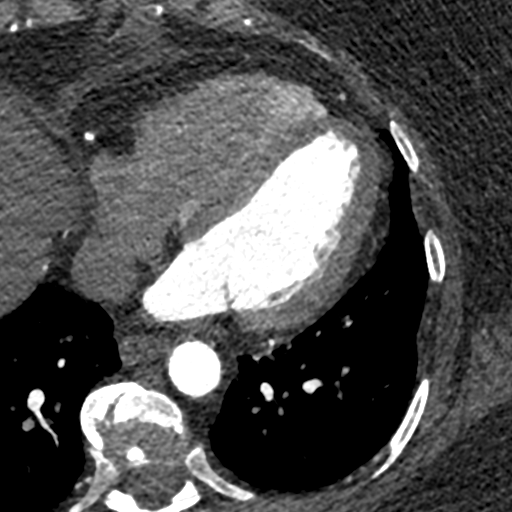
[im 153/229  vessel]
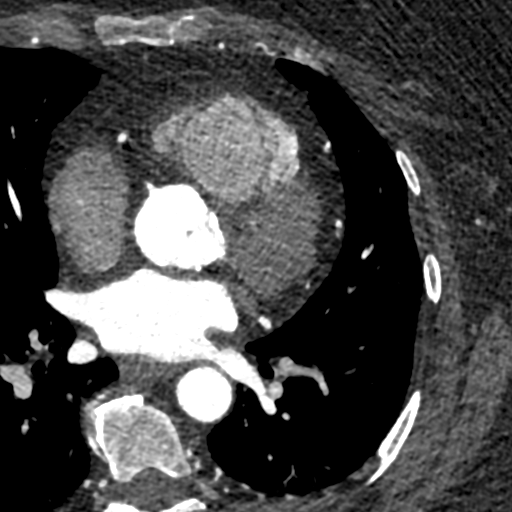

[8 of 20 positions shown; findings below may reference images not displayed]

FINDINGS: Atherosclerotic calcifications in the thoracic aorta. Within the
visualized portions of the thorax there are no suspicious appearing
pulmonary nodules or masses, there is no acute consolidative
airspace disease, no pleural effusions, no pneumothorax and no
lymphadenopathy. Visualized portions of the upper abdomen are
unremarkable. There are no aggressive appearing lytic or blastic
lesions noted in the visualized portions of the skeleton.
IMPRESSION: 1.  Aortic Atherosclerosis ([HH]-[HH]).
FINDINGS: A 100 kV prospective scan was triggered in the descending thoracic
aorta at 111 HU's. Axial non-contrast 3 mm slices were carried out
through the heart. The data set was analyzed on a dedicated work
station and scored using the Agatson method. Gantry rotation speed
was 250 msecs and collimation was .6 mm. No beta blockade and 0.8 mg
of sl NTG was given. The 3D data set was reconstructed in 5%
intervals of the 67-82 % of the R-R cycle. Diastolic phases were
analyzed on a dedicated work station using MPR, MIP and VRT modes.
The patient received 80 cc of contrast.

Study Quality: Fair

Aorta: Normal size. Mild aortic root calcifications. No dissection.

Aortic Valve: Trileaflet. Mild to moderate calcifications (Agatston
score 300).

Coronary Arteries:  Normal coronary origin.  Right dominance.

RCA is a large dominant artery that gives rise to PDA and PLA. There
is minimal (<24%) soft plaque in the mid RCA. The proximal and
distal RCA with no plaques.

Left main is a large artery that gives rise to LAD and LCX arteries.

LAD is a large vessel. There is minimal (<24%) calcified plaque in
the proximal LAD. The mid and distal LAD with no plaques. D1 is a
larger vessel with no plaques.

LCX is a non-dominant artery that gives rise to one large OM1
branch. There is no plaque.

Coronary Calcium Score:

Left main: 0

Left anterior descending artery:

Left circumflex artery: 0

Right coronary artery: 0

Total:

Percentile: 90

Other findings:

Normal pulmonary vein drainage into the left atrium.

Normal left atrial appendage without a thrombus.

Normal size of the pulmonary artery.

Mild Mitral annular calcification.
IMPRESSION: 1. Coronary calcium score of 50.6. This was 90 percentile for age
and sex matched control.

2. Normal coronary origin with right dominance.

3. CAD-RADS 2. Mild non-obstructive CAD (25-49%). Consider
non-atherosclerotic causes of chest pain. Consider preventive
therapy and risk factor modification.

4. Mild aortic valve calcification (Agatston score 300).

5. Aortic Atherosclerosis

The noncardiac portion of this study will be interpreted in separate
report by the radiologist.

*** End of Addendum ***
EXAM:
OVER-READ INTERPRETATION  CT CHEST

The following report is a limited chest CT over-read performed by
[DATE]. The coronary calcium score and cardiac CTA interpretation
by the cardiologist is attached.
FINDINGS: Atherosclerotic calcifications in the thoracic aorta. Within the
visualized portions of the thorax there are no suspicious appearing
pulmonary nodules or masses, there is no acute consolidative
airspace disease, no pleural effusions, no pneumothorax and no
lymphadenopathy. Visualized portions of the upper abdomen are
unremarkable. There are no aggressive appearing lytic or blastic
lesions noted in the visualized portions of the skeleton.
IMPRESSION: 1.  Aortic Atherosclerosis ([HH]-[HH]).

## 2022-03-20 MED ORDER — METOPROLOL TARTRATE 5 MG/5ML IV SOLN
INTRAVENOUS | Status: AC
  Filled 2022-03-20: qty 10

## 2022-03-20 MED ORDER — NITROGLYCERIN 0.4 MG SL SUBL
SUBLINGUAL_TABLET | SUBLINGUAL | Status: AC
  Filled 2022-03-20: qty 2

## 2022-03-20 MED ORDER — IOHEXOL 350 MG/ML SOLN
100.0000 mL | Freq: Once | INTRAVENOUS | Status: AC | PRN
  Administered 2022-03-20: 100 mL via INTRAVENOUS

## 2022-03-20 MED ORDER — NITROGLYCERIN 0.4 MG SL SUBL
0.8000 mg | SUBLINGUAL_TABLET | Freq: Once | SUBLINGUAL | Status: AC
Start: 2022-03-20 — End: 2022-03-20
  Administered 2022-03-20: 0.8 mg via SUBLINGUAL

## 2022-03-20 NOTE — Therapy (Signed)
[facility] [address] [place], [place], [address] Phone: [phone]   Fax:  [phone]  Physical Therapy Treatment  Patient Details  Name: Patricia Carrillo MRN: [id number] Date of Birth: 04/01/1966 Referring Provider (PT): Everardo PacificVarkey, MD   Encounter Date: 03/20/2022   PT End of Session - 03/20/22 1403     Visit Number 8    Number of Visits 16    Date for PT Re-Evaluation 05/05/22    PT Start Time 1304    PT Stop Time 1335    PT Time Calculation (min) 31 min    Activity Tolerance Patient limited by pain;Patient tolerated treatment well    Behavior During Therapy [facility] for tasks assessed/performed             Past Medical History:  Diagnosis Date   Anxiety    Chronic kidney disease    "stage 3" per pt   Fatigue    Headache    Hypertension    Obesity 01/16/2022   takes metformin and ozempic "for weight loss, not diabetic"   Sleep apnea    dx 15 years ago, never used CPAP    Past Surgical History:  Procedure Laterality Date   CHOLECYSTECTOMY     LAPAROSCOPIC HYSTERECTOMY     SHOULDER ARTHROSCOPY WITH DISTAL CLAVICLE RESECTION Left 01/26/2022   Procedure: SHOULDER ARTHROSCOPY WITH DISTAL CLAVICLE EXCISION/ DEBRIDEMENT;  Surgeon: Bjorn PippinVarkey, Dax T, MD;  Location: [facility];  Service: Orthopedics;  Laterality: Left;   SHOULDER ARTHROSCOPY WITH SUBACROMIAL DECOMPRESSION, ROTATOR CUFF REPAIR AND BICEP TENDON REPAIR Left 01/26/2022   Procedure: SHOULDER ARTHROSCOPY WITH SUBACROMIAL DECOMPRESSION, ROTATOR CUFF REPAIR;  Surgeon: Bjorn PippinVarkey, Dax T, MD;  Location: [facility];  Service: Orthopedics;  Laterality: Left;   TONSILLECTOMY     TUBAL LIGATION      There were no vitals filed for this visit.   Subjective Assessment - 03/20/22 1340     Subjective Can now reach overhead better.    Limitations House hold activities;Lifting    Patient Stated Goals be able to use her arm and clean around her house     Currently in Pain? Yes    Pain Score 2     Pain Location Shoulder    Pain Orientation Left    Pain Descriptors / Indicators Sore                [facility] PT Assessment - 03/20/22 0001       Assessment   Medical Diagnosis Cuff repair    Referring Provider (PT) Everardo PacificVarkey, MD    Onset Date/Surgical Date 01/26/22    Hand Dominance Right    Next MD Visit 03/09/22    Prior Therapy No      Precautions   Precautions Shoulder    Type of Shoulder Precautions Left rotator cuff repair                           [facility] Adult PT Treatment/Exercise - 03/20/22 0001       Shoulder Exercises: Supine   Protraction AAROM;Both;20 reps;10 reps    External Rotation AAROM;Left;20 reps;10 reps    Flexion AAROM;Both;20 reps;10 reps      Shoulder Exercises: Pulleys   Flexion 5 minutes      Shoulder Exercises: ROM/Strengthening   Ranger seated; flex/ext, circles x3 min each    Other ROM/Strengthening Exercises Wall ladder x15 reps  PT Short Term Goals - 02/16/22 1002       PT SHORT TERM GOAL #1   Title Patient will be independent with her initial HEP.    Time 4    Period Weeks    Status New    Target Date 03/16/22      PT SHORT TERM GOAL #2   Title Patient will be able to demonstrate at least 120 degrees of passive left shoulder flexion.    Time 4    Period Weeks    Status New    Target Date 03/16/22      PT SHORT TERM GOAL #3   Title Patient will be able to demonstrate at least 120 degrees of passive left shoulder abduction.    Time 4    Period Weeks    Status New    Target Date 03/16/22               PT Long Term Goals - 02/16/22 1000       PT LONG TERM GOAL #1   Title Patient will be independent with her advanced HEP.    Time 8    Period Weeks    Status New    Target Date 04/13/22      PT LONG TERM GOAL #2   Title Patient will be able to demonstrate at least 120 degrees of active left shoulder flexion for improved  function reaching overhead.    Time 8    Period Weeks    Status New    Target Date 04/13/22      PT LONG TERM GOAL #3   Title Patient will be able to demonstrate at least 120 degrees of active left shoulder abduction for improved function with overhead activities.    Time 8    Period Weeks    Status New    Target Date 04/13/22      PT LONG TERM GOAL #4   Title Patient will be able to complete her daily activities without her pain exceeding 5/10.    Time 8    Period Weeks    Status New    Target Date 04/13/22                   Plan - 03/20/22 1449     Clinical Impression Statement Patient presented in clinic with mild L shoulder discomfort. Patient able to demonstrate good L shoulder AAROM activities with no complaint of pain. Patient had no limitations of supine AAROM but required more technique correction for ER.    Personal Factors and Comorbidities Other;Comorbidity 1    Comorbidities OA    Examination-Activity Limitations Reach Overhead;Carry;Sleep;Dressing;Hygiene/Grooming;Lift    Examination-Participation Restrictions Yard Work;Laundry;Shop;Driving;Community Activity;Cleaning    Stability/Clinical Decision Making Evolving/Moderate complexity    Rehab Potential Good    PT Frequency 2x / week    PT Duration 8 weeks    PT Treatment/Interventions ADLs/Self Care Home Management;Neuromuscular re-education;Therapeutic exercise;Therapeutic activities;Patient/family education;Manual techniques;Passive range of motion    PT Next Visit Plan see protocol; pendulums, scapular retraction; and PROM    Consulted and Agree with Plan of Care Patient             Patient will benefit from skilled therapeutic intervention in order to improve the following deficits and impairments:  Decreased range of motion, Decreased activity tolerance, Pain, Decreased strength, Decreased mobility, Impaired UE functional use  Visit Diagnosis: Acute pain of left shoulder  Stiffness of left  shoulder, not elsewhere classified  Problem List Patient Active Problem List   Diagnosis Date Noted   Lumbar degenerative disc disease 11/30/2021   Impingement syndrome, shoulder, left 03/14/2021   Primary osteoarthritis of both knees 03/14/2021   Rationale for Evaluation and Treatment Rehabilitation   Marvell Fuller, [place] 03/20/2022, 2:56 PM  [facility] [address] [place], [place], [address] Phone: [phone]   Fax:  [phone]  Name: Patricia Carrillo MRN: [id number] Date of Birth: 11-16-1965

## 2022-03-24 ENCOUNTER — Ambulatory Visit: Payer: Medicaid Other | Admitting: Physical Therapy

## 2022-03-24 ENCOUNTER — Encounter: Payer: Self-pay | Admitting: Physical Therapy

## 2022-03-24 DIAGNOSIS — M25512 Pain in left shoulder: Secondary | ICD-10-CM | POA: Diagnosis not present

## 2022-03-24 DIAGNOSIS — M25612 Stiffness of left shoulder, not elsewhere classified: Secondary | ICD-10-CM

## 2022-03-24 NOTE — Therapy (Signed)
[facility] [address] [place], [place], [address] Phone: [phone]   Fax:  [phone]  Physical Therapy Treatment  Patient Details  Name: Patricia Carrillo MRN: [id number] Date of Birth: Dec 07, 1965 Referring Provider (PT): Everardo Pacific, MD   Encounter Date: 03/24/2022   PT End of Session - 03/24/22 0907     Visit Number 9    Number of Visits 16    Date for PT Re-Evaluation 05/05/22    PT Start Time 0902    PT Stop Time 0938    PT Time Calculation (min) 36 min    Activity Tolerance Patient tolerated treatment well;Patient limited by fatigue    Behavior During Therapy [facility] for tasks assessed/performed             Past Medical History:  Diagnosis Date   Anxiety    Chronic kidney disease    "stage 3" per pt   Fatigue    Headache    Hypertension    Obesity 01/16/2022   takes metformin and ozempic "for weight loss, not diabetic"   Sleep apnea    dx 15 years ago, never used CPAP    Past Surgical History:  Procedure Laterality Date   CHOLECYSTECTOMY     LAPAROSCOPIC HYSTERECTOMY     SHOULDER ARTHROSCOPY WITH DISTAL CLAVICLE RESECTION Left 01/26/2022   Procedure: SHOULDER ARTHROSCOPY WITH DISTAL CLAVICLE EXCISION/ DEBRIDEMENT;  Surgeon: Bjorn Pippin, MD;  Location: [facility];  Service: Orthopedics;  Laterality: Left;   SHOULDER ARTHROSCOPY WITH SUBACROMIAL DECOMPRESSION, ROTATOR CUFF REPAIR AND BICEP TENDON REPAIR Left 01/26/2022   Procedure: SHOULDER ARTHROSCOPY WITH SUBACROMIAL DECOMPRESSION, ROTATOR CUFF REPAIR;  Surgeon: Bjorn Pippin, MD;  Location: [facility];  Service: Orthopedics;  Laterality: Left;   TONSILLECTOMY     TUBAL LIGATION      There were no vitals filed for this visit.   Subjective Assessment - 03/24/22 0906     Subjective No new complaints.    Limitations House hold activities;Lifting    Patient Stated Goals be able to use her arm and clean around her house    Currently in  Pain? No/denies                [facility] PT Assessment - 03/24/22 0001       Assessment   Medical Diagnosis Cuff repair    Referring Provider (PT) Everardo Pacific, MD    Onset Date/Surgical Date 01/26/22    Hand Dominance Right    Prior Therapy No      Precautions   Precautions Shoulder    Type of Shoulder Precautions Left rotator cuff repair                           [facility] Adult PT Treatment/Exercise - 03/24/22 0001       Shoulder Exercises: Supine   Protraction AROM;Left;20 reps;10 reps      Shoulder Exercises: Seated   External Rotation AROM;Both;20 reps;10 reps    Flexion AROM;Left;20 reps;10 reps    Flexion Limitations upper cut and full shoulder extension      Shoulder Exercises: Pulleys   Flexion 5 minutes      Shoulder Exercises: ROM/Strengthening   Ranger standing; flex/ext, circles x3 min each                       PT Short Term Goals - 02/16/22 1002       PT SHORT  TERM GOAL #1   Title Patient will be independent with her initial HEP.    Time 4    Period Weeks    Status New    Target Date 03/16/22      PT SHORT TERM GOAL #2   Title Patient will be able to demonstrate at least 120 degrees of passive left shoulder flexion.    Time 4    Period Weeks    Status New    Target Date 03/16/22      PT SHORT TERM GOAL #3   Title Patient will be able to demonstrate at least 120 degrees of passive left shoulder abduction.    Time 4    Period Weeks    Status New    Target Date 03/16/22               PT Long Term Goals - 02/16/22 1000       PT LONG TERM GOAL #1   Title Patient will be independent with her advanced HEP.    Time 8    Period Weeks    Status New    Target Date 04/13/22      PT LONG TERM GOAL #2   Title Patient will be able to demonstrate at least 120 degrees of active left shoulder flexion for improved function reaching overhead.    Time 8    Period Weeks    Status New    Target Date 04/13/22      PT  LONG TERM GOAL #3   Title Patient will be able to demonstrate at least 120 degrees of active left shoulder abduction for improved function with overhead activities.    Time 8    Period Weeks    Status New    Target Date 04/13/22      PT LONG TERM GOAL #4   Title Patient will be able to complete her daily activities without her pain exceeding 5/10.    Time 8    Period Weeks    Status New    Target Date 04/13/22                   Plan - 03/24/22 0942     Clinical Impression Statement Patient presented in clinic with reports of no current pain. Patient able to demonstrate great L shoulder AROM in antigravity with little to no discomfort. Patient does have muscle fatigue at this time which limits exercise tolerance.    Personal Factors and Comorbidities Other;Comorbidity 1    Comorbidities OA    Examination-Activity Limitations Reach Overhead;Carry;Sleep;Dressing;Hygiene/Grooming;Lift    Examination-Participation Restrictions Yard Work;Laundry;Shop;Driving;Community Activity;Cleaning    Stability/Clinical Decision Making Evolving/Moderate complexity    Rehab Potential Good    PT Frequency 2x / week    PT Duration 8 weeks    PT Treatment/Interventions ADLs/Self Care Home Management;Neuromuscular re-education;Therapeutic exercise;Therapeutic activities;Patient/family education;Manual techniques;Passive range of motion    PT Next Visit Plan see protocol; pendulums, scapular retraction; and PROM    Consulted and Agree with Plan of Care Patient             Patient will benefit from skilled therapeutic intervention in order to improve the following deficits and impairments:  Decreased range of motion, Decreased activity tolerance, Pain, Decreased strength, Decreased mobility, Impaired UE functional use  Visit Diagnosis: Acute pain of left shoulder  Stiffness of left shoulder, not elsewhere classified     Problem List Patient Active Problem List   Diagnosis Date Noted  Lumbar degenerative disc disease 11/30/2021   Impingement syndrome, shoulder, left 03/14/2021   Primary osteoarthritis of both knees 03/14/2021   Rationale for Evaluation and Treatment Rehabilitation   Marvell Fuller, [place] 03/24/2022, 10:04 AM  [facility] [address] [place], [place], [address] Phone: [phone]   Fax:  [phone]  Name: Patricia Carrillo MRN: [id number] Date of Birth: July 04, 1966

## 2022-03-29 ENCOUNTER — Ambulatory Visit: Payer: Medicaid Other

## 2022-03-29 DIAGNOSIS — M25512 Pain in left shoulder: Secondary | ICD-10-CM | POA: Diagnosis not present

## 2022-03-29 DIAGNOSIS — M25612 Stiffness of left shoulder, not elsewhere classified: Secondary | ICD-10-CM

## 2022-03-29 NOTE — Therapy (Addendum)
[facility] [place], [place], [address] Phone: [phone]   Fax:  [phone]  Physical Therapy Treatment  Patient Details  Name: Patricia Carrillo MRN: [id number] Date of Birth: Jan 13, 1966 Referring Provider (PT): Griffin Basil, MD   Encounter Date: 03/29/2022   PT End of Session - 03/29/22 0951     Visit Number 10    Number of Visits 16    Date for PT Re-Evaluation 05/05/22    PT Start Time 0945    PT Stop Time [id number]    PT Time Calculation (min) 59 min    Activity Tolerance Patient tolerated treatment well;Patient limited by fatigue    Behavior During Therapy [facility] for tasks assessed/performed             Past Medical History:  Diagnosis Date   Anxiety    Chronic kidney disease    "stage 3" per pt   Fatigue    Headache    Hypertension    Obesity 01/16/2022   takes metformin and ozempic "for weight loss, not diabetic"   Sleep apnea    dx 15 years ago, never used CPAP    Past Surgical History:  Procedure Laterality Date   CHOLECYSTECTOMY     LAPAROSCOPIC HYSTERECTOMY     SHOULDER ARTHROSCOPY WITH DISTAL CLAVICLE RESECTION Left 01/26/2022   Procedure: SHOULDER ARTHROSCOPY WITH DISTAL CLAVICLE EXCISION/ DEBRIDEMENT;  Surgeon: Hiram Gash, MD;  Location: [place];  Service: Orthopedics;  Laterality: Left;   SHOULDER ARTHROSCOPY WITH SUBACROMIAL DECOMPRESSION, ROTATOR CUFF REPAIR AND BICEP TENDON REPAIR Left 01/26/2022   Procedure: SHOULDER ARTHROSCOPY WITH SUBACROMIAL DECOMPRESSION, ROTATOR CUFF REPAIR;  Surgeon: Hiram Gash, MD;  Location: [place];  Service: Orthopedics;  Laterality: Left;   TONSILLECTOMY     TUBAL LIGATION      There were no vitals filed for this visit.   Subjective Assessment - 03/29/22 0950     Subjective Pt arrives for today's treatment session reporting 2/10 left shoulder pain.    Limitations House hold activities;Lifting    Patient Stated Goals be able  to use her arm and clean around her house    Currently in Pain? Yes    Pain Score 2     Pain Location Shoulder                               [facility] Adult PT Treatment/Exercise - 03/29/22 0001       Shoulder Exercises: Seated   External Rotation AROM;Both;20 reps;10 reps    Flexion AROM;Left;20 reps;10 reps    Abduction AROM;Left;20 reps;10 reps      Shoulder Exercises: Pulleys   Flexion 5 minutes      Shoulder Exercises: ROM/Strengthening   UBE (Upper Arm Bike) 8 mins 120 RPM (4 mins forward/backward)    Ranger standing flex/ext; rainbows, circles x 2 mins each                     PT Education - 03/29/22 1045     Education Details progress with therapy, HEP, POC, and return to daily activities (PT consulted)    Person(s) Educated Patient    Methods Explanation    Comprehension Verbalized understanding              PT Short Term Goals - 02/16/22 1002       PT SHORT TERM GOAL #1   Title Patient will  be independent with her initial HEP.    Time 4    Period Weeks    Status New    Target Date 03/16/22      PT SHORT TERM GOAL #2   Title Patient will be able to demonstrate at least 120 degrees of passive left shoulder flexion.    Time 4    Period Weeks    Status New    Target Date 03/16/22      PT SHORT TERM GOAL #3   Title Patient will be able to demonstrate at least 120 degrees of passive left shoulder abduction.    Time 4    Period Weeks    Status New    Target Date 03/16/22               PT Long Term Goals - 03/29/22 0952       PT LONG TERM GOAL #1   Title Patient will be independent with her advanced HEP.    Time 8    Period Weeks    Status Achieved    Target Date 04/13/22      PT LONG TERM GOAL #2   Title Patient will be able to demonstrate at least 120 degrees of active left shoulder flexion for improved function reaching overhead.    Baseline 03/29/22: 158 degrees of active flexion    Time 8    Period  Weeks    Status New    Target Date 04/13/22      PT LONG TERM GOAL #3   Title Patient will be able to demonstrate at least 120 degrees of active left shoulder abduction for improved function with overhead activities.    Baseline 03/29/22: 150 degrees of active abduction    Time 8    Period Weeks    Status New    Target Date 04/13/22      PT LONG TERM GOAL #4   Title Patient will be able to complete her daily activities without her pain exceeding 5/10.    Baseline 03/29/22: able to perform all activities at this time, but encourages children's help    Time 8    Period Weeks    Status Partially Met    Target Date 04/13/22                   Plan - 03/29/22 0951     Clinical Impression Statement Pt arrives for today's treatment session reporting 2/10 left shoulder pain.  Pt able to demonstrated 150 degrees of active abduction and 158 degrees of active flexion on her left shoulder today.  Pt able to perform AROM of left shoulder against gravity with out issue or discomfort.  Pt educated on continuation of HEP, her progress with therapy at this point, meeting or partially meeting her goals, and her current POC.  PT consulted with all education.  Pt reported 2/10 left shoulder pain upon completion of today's treatment session.    Personal Factors and Comorbidities Other;Comorbidity 1    Comorbidities OA    Examination-Activity Limitations Reach Overhead;Carry;Sleep;Dressing;Hygiene/Grooming;Lift    Examination-Participation Restrictions Yard Work;Laundry;Shop;Driving;Community Activity;Cleaning    Stability/Clinical Decision Making Evolving/Moderate complexity    Rehab Potential Good    PT Frequency 2x / week    PT Duration 8 weeks    PT Treatment/Interventions ADLs/Self Care Home Management;Neuromuscular re-education;Therapeutic exercise;Therapeutic activities;Patient/family education;Manual techniques;Passive range of motion    PT Next Visit Plan see protocol; pendulums, scapular  retraction; and PROM    Consulted and  Agree with Plan of Care Patient             Patient will benefit from skilled therapeutic intervention in order to improve the following deficits and impairments:  Decreased range of motion, Decreased activity tolerance, Pain, Decreased strength, Decreased mobility, Impaired UE functional use  Visit Diagnosis: Acute pain of left shoulder  Stiffness of left shoulder, not elsewhere classified     Problem List Patient Active Problem List   Diagnosis Date Noted   Lumbar degenerative disc disease 11/30/2021   Impingement syndrome, shoulder, left 03/14/2021   Primary osteoarthritis of both knees 03/14/2021   Rationale for Evaluation and Treatment Rehabilitation  Kathrynn Ducking, PTA 03/29/2022, 11:20 AM  [facility] [place], [place], [address] Phone: [phone]   Fax:  [phone]  Name: Briauna Gilmartin MRN: [id number] Date of Birth: 09/22/1966  Progress Note Reporting Period 02/16/22 to 03/29/22  See note below for Objective Data and Assessment of Progress/Goals.   Patient is making excellent progress with skilled physical therapy as evidenced by her functional mobility and progress toward her goals. Her goals were updated to address her remaining impairments to return to her prior level of function. Her plan of care was modified to once per week for four additional weeks to promote increased independence with her HEP. She reported feeling comfortable with these modifications. Recommend that she continue with her updated plan of care to address her remaining impairments to return to her prior level of function.  Jacqulynn Cadet, PT, DPT

## 2022-04-07 ENCOUNTER — Ambulatory Visit: Payer: Medicaid Other | Admitting: Physical Therapy

## 2022-04-07 ENCOUNTER — Encounter: Payer: Self-pay | Admitting: Physical Therapy

## 2022-04-07 DIAGNOSIS — M25512 Pain in left shoulder: Secondary | ICD-10-CM | POA: Diagnosis not present

## 2022-04-07 DIAGNOSIS — M25612 Stiffness of left shoulder, not elsewhere classified: Secondary | ICD-10-CM

## 2022-04-07 NOTE — Therapy (Addendum)
OUTPATIENT PHYSICAL THERAPY TREATMENT NOTE   Patient Name: Patricia Carrillo MRN: [id number] DOB:08-31-1966, 56 y.o., female Today's Date: 04/07/2022   REFERRING PROVIDER: Ophelia Charter, MD   PT End of Session - 04/07/22 0813     Visit Number 11    Number of Visits 16    Date for PT Re-Evaluation 05/05/22    Authorization - Number of Visits 13    PT Start Time [phone]    PT Stop Time 0900    PT Time Calculation (min) 41 min    Activity Tolerance Patient tolerated treatment well    Behavior During Therapy [facility] for tasks assessed/performed               Past Medical History:  Diagnosis Date   Anxiety    Chronic kidney disease    "stage 3" per pt   Fatigue    Headache    Hypertension    Obesity 01/16/2022   takes metformin and ozempic "for weight loss, not diabetic"   Sleep apnea    dx 15 years ago, never used CPAP   Past Surgical History:  Procedure Laterality Date   CHOLECYSTECTOMY     LAPAROSCOPIC HYSTERECTOMY     SHOULDER ARTHROSCOPY WITH DISTAL CLAVICLE RESECTION Left 01/26/2022   Procedure: SHOULDER ARTHROSCOPY WITH DISTAL CLAVICLE EXCISION/ DEBRIDEMENT;  Surgeon: Hiram Gash, MD;  Location: [place];  Service: Orthopedics;  Laterality: Left;   SHOULDER ARTHROSCOPY WITH SUBACROMIAL DECOMPRESSION, ROTATOR CUFF REPAIR AND BICEP TENDON REPAIR Left 01/26/2022   Procedure: SHOULDER ARTHROSCOPY WITH SUBACROMIAL DECOMPRESSION, ROTATOR CUFF REPAIR;  Surgeon: Hiram Gash, MD;  Location: [place];  Service: Orthopedics;  Laterality: Left;   TONSILLECTOMY     TUBAL LIGATION     Patient Active Problem List   Diagnosis Date Noted   Lumbar degenerative disc disease 11/30/2021   Impingement syndrome, shoulder, left 03/14/2021   Primary osteoarthritis of both knees 03/14/2021    REFERRING DIAG: Acute pain of the left shoulder    Stiffness of left shoulder, not elsewhere classified  THERAPY DIAG:  Acute pain of left shoulder  Stiffness of  left shoulder, not elsewhere classified  Rationale for Evaluation and Treatment Rehabilitation  PERTINENT HISTORY: None  PRECAUTIONS: left rotator cuff  SUBJECTIVE: Reports that she has had more pain in the last few days and doesn't know if its where she may have slept on the operative shoulder or weather related.  PAIN:  Are you having pain? Yes: NPRS scale: 2/10 Pain location: L shoulder Pain description: sore, throbbing Aggravating factors: None Relieving factors: sitting with heating pad     TODAY'S TREATMENT:                                     EXERCISE LOG  Exercise Repetitions and Resistance Comments  Pulleys X5 min sitting   Wall slides/wash Flex x20 reps; circles    L shoulder upper cut AROM x20 reps   L shoulder flexion AROM x20 reps   L shoulder abduction  AROM x15 reps   B shoulder ER  AROM x20 reps   L shoulder D2 AROM x20 reps   L shoulder row Bent; x20 reps   L shoulder extension Bent; x20 reps More back related pain with bent over exercises       Blank cell = exercise not performed today         PT  Short Term Goals - 04/07/22 9935       PT SHORT TERM GOAL #1   Title Patient will be independent with her initial HEP.    Time 4    Period Weeks    Status Achieved    Target Date 03/16/22      PT SHORT TERM GOAL #2   Title Patient will be able to demonstrate at least 120 degrees of passive left shoulder flexion.    Time 4    Period Weeks    Status Achieved    Target Date 03/16/22      PT SHORT TERM GOAL #3   Title Patient will be able to demonstrate at least 120 degrees of passive left shoulder abduction.    Time 4    Period Weeks    Status Achieved    Target Date 03/16/22              PT Long Term Goals - 04/07/22 0814       PT LONG TERM GOAL #1   Title Patient will be independent with her advanced HEP.    Time 8    Period Weeks    Status Achieved    Target Date 04/13/22      PT LONG TERM GOAL #2   Title Patient will be able  to demonstrate at least 120 degrees of active left shoulder flexion for improved function reaching overhead.    Baseline 03/29/22: 158 degrees of active flexion    Time 8    Period Weeks    Status Achieved    Target Date 04/13/22      PT LONG TERM GOAL #3   Title Patient will be able to demonstrate at least 120 degrees of active left shoulder abduction for improved function with overhead activities.    Baseline 03/29/22: 150 degrees of active abduction    Time 8    Period Weeks    Status Achieved    Target Date 04/13/22      PT LONG TERM GOAL #4   Title Patient will be able to complete her daily activities without her pain exceeding 5/10.    Baseline 03/29/22: able to perform all activities at this time, but encourages children's help    Time 8    Period Weeks    Status Partially Met    Target Date 04/13/22      PT LONG TERM GOAL #5   Title Patient will be able to lift and carry at least 8 pounds for improved function carrying and putting away her groceries.    Time 4    Period Weeks    Status On-going    Target Date 04/26/22               Plan - 04/07/22 7017     Clinical Impression Statement Patient presented in clinic with reports of mild pain over the last few days to which she was unaware the cause. Patient able to tolerate therex for AROM and antigravity strengthening as she noted that her LUE felt very fatigued and weak. Patient experienced more LBP with therex in bent over posture for posterior cuff strengthening. Patient encouraged to monitor symptoms over the weekend.   Personal Factors and Comorbidities Other;Comorbidity 1    Comorbidities OA    Examination-Activity Limitations Reach Overhead;Carry;Sleep;Dressing;Hygiene/Grooming;Lift    Examination-Participation Restrictions Yard Work;Laundry;Shop;Driving;Community Activity;Cleaning    Stability/Clinical Decision Making Evolving/Moderate complexity    Rehab Potential Good    PT Frequency 1x / week  PT  Duration 4 weeks    PT Treatment/Interventions ADLs/Self Care Home Management;Neuromuscular re-education;Therapeutic exercise;Therapeutic activities;Patient/family education;Manual techniques;Passive range of motion    PT Next Visit Plan see protocol; pendulums, scapular retraction; and PROM    Consulted and Agree with Plan of Care Patient                Standley Brooking, PTA 04/07/22 9:03 AM

## 2022-04-13 ENCOUNTER — Encounter: Payer: Self-pay | Admitting: Physical Therapy

## 2022-04-13 ENCOUNTER — Ambulatory Visit: Payer: Medicaid Other | Attending: Orthopaedic Surgery | Admitting: Physical Therapy

## 2022-04-13 DIAGNOSIS — M25512 Pain in left shoulder: Secondary | ICD-10-CM | POA: Insufficient documentation

## 2022-04-13 DIAGNOSIS — M25612 Stiffness of left shoulder, not elsewhere classified: Secondary | ICD-10-CM | POA: Insufficient documentation

## 2022-04-13 NOTE — Therapy (Signed)
OUTPATIENT PHYSICAL THERAPY TREATMENT NOTE   Patient Name: Patricia Carrillo MRN: [id number] DOB:06-13-1966, 56 y.o., female Today's Date: 04/13/2022   REFERRING PROVIDER: Ophelia Charter, MD   PT End of Session - 04/07/22 0813     Visit Number 12   Number of Visits 16    Date for PT Re-Evaluation 05/05/22    Authorization - Number of Visits 13    PT Start Time [phone]   PT Stop Time 0900   PT Time Calculation (min) 43 min    Activity Tolerance Patient tolerated treatment well    Behavior During Therapy [facility] for tasks assessed/performed               Past Medical History:  Diagnosis Date   Anxiety    Chronic kidney disease    "stage 3" per pt   Fatigue    Headache    Hypertension    Obesity 01/16/2022   takes metformin and ozempic "for weight loss, not diabetic"   Sleep apnea    dx 15 years ago, never used CPAP   Past Surgical History:  Procedure Laterality Date   CHOLECYSTECTOMY     LAPAROSCOPIC HYSTERECTOMY     SHOULDER ARTHROSCOPY WITH DISTAL CLAVICLE RESECTION Left 01/26/2022   Procedure: SHOULDER ARTHROSCOPY WITH DISTAL CLAVICLE EXCISION/ DEBRIDEMENT;  Surgeon: Hiram Gash, MD;  Location: [place];  Service: Orthopedics;  Laterality: Left;   SHOULDER ARTHROSCOPY WITH SUBACROMIAL DECOMPRESSION, ROTATOR CUFF REPAIR AND BICEP TENDON REPAIR Left 01/26/2022   Procedure: SHOULDER ARTHROSCOPY WITH SUBACROMIAL DECOMPRESSION, ROTATOR CUFF REPAIR;  Surgeon: Hiram Gash, MD;  Location: [place];  Service: Orthopedics;  Laterality: Left;   TONSILLECTOMY     TUBAL LIGATION     Patient Active Problem List   Diagnosis Date Noted   Lumbar degenerative disc disease 11/30/2021   Impingement syndrome, shoulder, left 03/14/2021   Primary osteoarthritis of both knees 03/14/2021    REFERRING DIAG: Acute pain of the left shoulder    Stiffness of left shoulder, not elsewhere classified  THERAPY DIAG:  Acute pain of left shoulder  Stiffness of left  shoulder, not elsewhere classified  Rationale for Evaluation and Treatment Rehabilitation  PERTINENT HISTORY: None  PRECAUTIONS: left rotator cuff  SUBJECTIVE: Reports that she may have irritated her shoulder by holding a baby for a little bit over the weekend.  PAIN:  Are you having pain? Yes: NPRS scale: 4/10 Pain location: L shoulder Pain description: sore, throbbing Aggravating factors: None Relieving factors: sitting with heating pad     TODAY'S TREATMENT:                                     EXERCISE LOG  Exercise Repetitions and Resistance Comments  Pulleys X5 min sitting   Wall slides/wash Flex x20 reps; circles    L shoulder upper cut AROM x20 reps   L shoulder flexion AROM x20 reps   L shoulder short lever abduction AROM x20 reps   L shoulder abduction  AROM x20 reps   B shoulder ER  AROM x20 reps   L shoulder D2 AROM x10 reps   L shoulder row Yellow; x20 reps   B horizontal abduction Yellow; x20 reps        Blank cell = exercise not performed today         PT Short Term Goals - 04/07/22 8638  PT SHORT TERM GOAL #1   Title Patient will be independent with her initial HEP.    Time 4    Period Weeks    Status Achieved    Target Date 03/16/22      PT SHORT TERM GOAL #2   Title Patient will be able to demonstrate at least 120 degrees of passive left shoulder flexion.    Time 4    Period Weeks    Status Achieved    Target Date 03/16/22      PT SHORT TERM GOAL #3   Title Patient will be able to demonstrate at least 120 degrees of passive left shoulder abduction.    Time 4    Period Weeks    Status Achieved    Target Date 03/16/22              PT Long Term Goals - 04/07/22 0814       PT LONG TERM GOAL #1   Title Patient will be independent with her advanced HEP.    Time 8    Period Weeks    Status Achieved    Target Date 04/13/22      PT LONG TERM GOAL #2   Title Patient will be able to demonstrate at least 120 degrees of  active left shoulder flexion for improved function reaching overhead.    Baseline 03/29/22: 158 degrees of active flexion    Time 8    Period Weeks    Status Achieved    Target Date 04/13/22      PT LONG TERM GOAL #3   Title Patient will be able to demonstrate at least 120 degrees of active left shoulder abduction for improved function with overhead activities.    Baseline 03/29/22: 150 degrees of active abduction    Time 8    Period Weeks    Status Achieved    Target Date 04/13/22      PT LONG TERM GOAL #4   Title Patient will be able to complete her daily activities without her pain exceeding 5/10.    Baseline 03/29/22: able to perform all activities at this time, but encourages children's help    Time 8    Period Weeks    Status Partially Met    Target Date 04/13/22      PT LONG TERM GOAL #5   Title Patient will be able to lift and carry at least 8 pounds for improved function carrying and putting away her groceries.    Time 4    Period Weeks    Status On-going    Target Date 04/26/22               Plan - 04/07/22 2836     Clinical Impression Statement Patient presented in clinic with soreness after sitting and holding a baby over the weekend that may be approximately 12-13 lbs. Patient progressed through AROM and light strengthening exercises with only intermittant rest breaks required.    Personal Factors and Comorbidities Other;Comorbidity 1    Comorbidities OA    Examination-Activity Limitations Reach Overhead;Carry;Sleep;Dressing;Hygiene/Grooming;Lift    Examination-Participation Restrictions Yard Work;Laundry;Shop;Driving;Community Activity;Cleaning    Stability/Clinical Decision Making Evolving/Moderate complexity    Rehab Potential Good    PT Frequency 1x / week    PT Duration 4 weeks    PT Treatment/Interventions ADLs/Self Care Home Management;Neuromuscular re-education;Therapeutic exercise;Therapeutic activities;Patient/family education;Manual  techniques;Passive range of motion    PT Next Visit Plan see protocol; pendulums, scapular retraction; and PROM  Consulted and Agree with Plan of Care Patient                Standley Brooking, PTA 04/13/22 10:20 AM

## 2022-04-14 ENCOUNTER — Ambulatory Visit: Payer: Medicaid Other | Admitting: Sports Medicine

## 2022-04-14 DIAGNOSIS — M51369 Other intervertebral disc degeneration, lumbar region without mention of lumbar back pain or lower extremity pain: Secondary | ICD-10-CM

## 2022-04-14 DIAGNOSIS — M5136 Other intervertebral disc degeneration, lumbar region: Secondary | ICD-10-CM

## 2022-04-14 NOTE — Assessment & Plan Note (Addendum)
This is a very pleasant 56 year old female, longstanding axial low back pain, occasional radicular discomfort down the left leg in an L5 distribution, she did have her epidural done, left L5-S1 transforaminal epidural. She had significant discomfort during the procedure but 2 weeks of near complete relief. The pain of the procedure however was fairly severe. We did discuss options including continued oxycodone and gabapentin pain management with her primary, giving epidurals the due diligence of #2 and #3 with triazolam preprocedural anxiolysis and sedation, doing nothing and watching it for now, and a consultation with neurosurgery. She will let me know which path she would like to take.  Of note, down 40 pounds with Ozempic 0.5 mg, but having trouble getting the 1 mg pens.

## 2022-04-14 NOTE — Progress Notes (Signed)
    Procedures performed today:    None.  Independent interpretation of notes and tests performed by another provider:   None.  Brief History, Exam, Impression, and Recommendations:    Lumbar degenerative disc disease This is a very pleasant 56 year old female, longstanding axial low back pain, occasional radicular discomfort down the left leg in an L5 distribution, she did have her epidural done, left L5-S1 transforaminal epidural. She had significant discomfort during the procedure but 2 weeks of near complete relief. The pain of the procedure however was fairly severe. We did discuss options including continued oxycodone and gabapentin pain management with her primary, giving epidurals the due diligence of #2 and #3 with triazolam preprocedural anxiolysis and sedation, doing nothing and watching it for now, and a consultation with neurosurgery. She will let me know which path she would like to take.  Of note, down 40 pounds with Ozempic 0.5 mg, but having trouble getting the 1 mg pens.    ____________________________________________ Ihor Austin. Benjamin Stain, M.D., ABFM., CAQSM., AME. Primary Care and Sports Medicine [facility]  Adjunct Professor of Family Medicine  [employer]  Restaurant manager, fast food

## 2022-04-17 ENCOUNTER — Encounter: Payer: Self-pay | Admitting: Physical Therapy

## 2022-04-17 ENCOUNTER — Ambulatory Visit: Payer: Medicaid Other | Admitting: Physical Therapy

## 2022-04-17 DIAGNOSIS — M25512 Pain in left shoulder: Secondary | ICD-10-CM

## 2022-04-17 DIAGNOSIS — M25612 Stiffness of left shoulder, not elsewhere classified: Secondary | ICD-10-CM

## 2022-04-17 NOTE — Therapy (Addendum)
OUTPATIENT PHYSICAL THERAPY TREATMENT NOTE   Patient Name: Patricia Carrillo MRN: [id number] DOB:06/21/1966, 56 y.o., female Today's Date: 04/17/2022   REFERRING PROVIDER: Ophelia Charter, MD   PT End of Session - 04/07/22 0813     Visit Number 15   Number of Visits 16    Date for PT Re-Evaluation 05/05/22    Authorization - Number of Visits 13    PT Start Time 0817   PT Stop Time 0903   PT Time Calculation (min) 46 min    Activity Tolerance Patient tolerated treatment well    Behavior During Therapy [facility] for tasks assessed/performed               Past Medical History:  Diagnosis Date   Anxiety    Chronic kidney disease    "stage 3" per pt   Fatigue    Headache    Hypertension    Obesity 01/16/2022   takes metformin and ozempic "for weight loss, not diabetic"   Sleep apnea    dx 15 years ago, never used CPAP   Past Surgical History:  Procedure Laterality Date   CHOLECYSTECTOMY     LAPAROSCOPIC HYSTERECTOMY     SHOULDER ARTHROSCOPY WITH DISTAL CLAVICLE RESECTION Left 01/26/2022   Procedure: SHOULDER ARTHROSCOPY WITH DISTAL CLAVICLE EXCISION/ DEBRIDEMENT;  Surgeon: Hiram Gash, MD;  Location: [place];  Service: Orthopedics;  Laterality: Left;   SHOULDER ARTHROSCOPY WITH SUBACROMIAL DECOMPRESSION, ROTATOR CUFF REPAIR AND BICEP TENDON REPAIR Left 01/26/2022   Procedure: SHOULDER ARTHROSCOPY WITH SUBACROMIAL DECOMPRESSION, ROTATOR CUFF REPAIR;  Surgeon: Hiram Gash, MD;  Location: [place];  Service: Orthopedics;  Laterality: Left;   TONSILLECTOMY     TUBAL LIGATION     Patient Active Problem List   Diagnosis Date Noted   Lumbar degenerative disc disease 11/30/2021   Impingement syndrome, shoulder, left 03/14/2021   Primary osteoarthritis of both knees 03/14/2021    REFERRING DIAG: Acute pain of the left shoulder    Stiffness of left shoulder, not elsewhere classified  THERAPY DIAG:  Acute pain of left shoulder  Stiffness of  left shoulder, not elsewhere classified  Rationale for Evaluation and Treatment Rehabilitation  PERTINENT HISTORY: None  PRECAUTIONS: left rotator cuff  SUBJECTIVE: Reports being sick all weekend.  PAIN:  Are you having pain? Yes: NPRS scale: 2/10 Pain location: L shoulder Pain description: sore Aggravating factors: None Relieving factors: sitting with heating pad   .Marland Kitchenoprc  TODAY'S TREATMENT:                                     EXERCISE LOG  Exercise Repetitions and Resistance Comments  Pulleys X5 min sitting   UBE 120 RPM x8 min (forward/backward)   L shoulder extension Yellow; x20 reps   L shoulder row Yellow; x20 reps   L shoulder scaption AROM x20 reps   B shoulder ER  AROM x20 reps   L shoulder flexion AROM; x20 reps   LUE 8# carry X100 ft x1 RT        Blank cell = exercise not performed today     HEP: [id number] from [place]    PT Short Term Goals - 04/07/22 7824       PT SHORT TERM GOAL #1   Title Patient will be independent with her initial HEP.    Time 4    Period Weeks    Status  Achieved    Target Date 03/16/22      PT SHORT TERM GOAL #2   Title Patient will be able to demonstrate at least 120 degrees of passive left shoulder flexion.    Time 4    Period Weeks    Status Achieved    Target Date 03/16/22      PT SHORT TERM GOAL #3   Title Patient will be able to demonstrate at least 120 degrees of passive left shoulder abduction.    Time 4    Period Weeks    Status Achieved    Target Date 03/16/22              PT Long Term Goals - 04/07/22 0814       PT LONG TERM GOAL #1   Title Patient will be independent with her advanced HEP.    Time 8    Period Weeks    Status Achieved    Target Date 04/13/22      PT LONG TERM GOAL #2   Title Patient will be able to demonstrate at least 120 degrees of active left shoulder flexion for improved function reaching overhead.    Baseline 03/29/22: 158 degrees of active flexion    Time 8    Period  Weeks    Status Achieved    Target Date 04/13/22      PT LONG TERM GOAL #3   Title Patient will be able to demonstrate at least 120 degrees of active left shoulder abduction for improved function with overhead activities.    Baseline 03/29/22: 150 degrees of active abduction    Time 8    Period Weeks    Status Achieved    Target Date 04/13/22      PT LONG TERM GOAL #4   Title Patient will be able to complete her daily activities without her pain exceeding 5/10.    Baseline 03/29/22: able to perform all activities at this time, but encourages children's help    Time 8    Period Weeks    Status Achieved   Target Date 04/13/22      PT LONG TERM GOAL #5   Title Patient will be able to lift and carry at least 8 pounds for improved function carrying and putting away her groceries.    Time 4    Period Weeks    Status On-going    Target Date 04/26/22               Plan - 04/07/22 2482     Clinical Impression Statement Patient presented in clinic with mild soreness of the L shoulder. Has been carrying light weight groceries into the house but not excessive weight. Patient able to tolerate all shoulder strengthening and provided new HEP for resistance and functional reaching goals. Patient educated regarding technique and parameters.   Personal Factors and Comorbidities Other;Comorbidity 1    Comorbidities OA    Examination-Activity Limitations Reach Overhead;Carry;Sleep;Dressing;Hygiene/Grooming;Lift    Examination-Participation Restrictions Yard Work;Laundry;Shop;Driving;Community Activity;Cleaning    Stability/Clinical Decision Making Evolving/Moderate complexity    Rehab Potential Good    PT Frequency 1x / week    PT Duration 4 weeks    PT Treatment/Interventions ADLs/Self Care Home Management;Neuromuscular re-education;Therapeutic exercise;Therapeutic activities;Patient/family education;Manual techniques;Passive range of motion    PT Next Visit Plan see protocol; pendulums,  scapular retraction; and PROM    Consulted and Agree with Plan of Care Patient  Standley Brooking, PTA 04/17/22 8:19 AM  PHYSICAL THERAPY DISCHARGE SUMMARY  Visits from Start of Care: 15  Current functional level related to goals / functional outcomes: Patient has met all of her goals for physical therapy    Remaining deficits: soreness   Education / Equipment: HEP    Patient agrees to discharge. Patient goals were met. Patient is being discharged due to meeting the stated rehab goals.  Jacqulynn Cadet, PT, DPT

## 2022-05-29 ENCOUNTER — Ambulatory Visit: Payer: Medicaid Other | Admitting: Sports Medicine

## 2022-05-29 DIAGNOSIS — M7542 Impingement syndrome of left shoulder: Secondary | ICD-10-CM

## 2022-05-29 MED ORDER — TRIAZOLAM 0.25 MG PO TABS: ORAL_TABLET | ORAL | 0 refills | Status: DC

## 2022-05-29 NOTE — Assessment & Plan Note (Signed)
Patricia Carrillo returns, pleasant 56 year old female, she had shoulder impingement syndrome, she is postop from arthroscopic debridement with Dr. Everardo Pacific. Unfortunately she had a fall and is having worsening pain, impingement signs on exam today. Due to the worsening pain after an injury we will proceed with an updated MRI, I do think it needs to be an arthrogram so that we know that were not missing a labral tear as well. Return to see me for arthrogram injection. Triazolam for preprocedural anxiolysis per patient request.

## 2022-05-29 NOTE — Progress Notes (Signed)
    Procedures performed today:    None.  Independent interpretation of notes and tests performed by another provider:   None.  Brief History, Exam, Impression, and Recommendations:    Impingement syndrome, shoulder, left Patricia Carrillo, pleasant 56 year old female, she had shoulder impingement syndrome, she is postop from arthroscopic debridement with Dr. Everardo Pacific. Unfortunately she had a fall and is having worsening pain, impingement signs on exam today. Due to the worsening pain after an injury we will proceed with an updated MRI, I do think it needs to be an arthrogram so that we know that were not missing a labral tear as well. Return to see me for arthrogram injection. Triazolam for preprocedural anxiolysis per patient request.    ____________________________________________ Ihor Austin. Benjamin Stain, M.D., ABFM., CAQSM., AME. Primary Care and Sports Medicine [facility]  Adjunct Professor of Family Medicine  [place] [employer]  Restaurant manager, fast food

## 2022-06-13 ENCOUNTER — Ambulatory Visit (INDEPENDENT_AMBULATORY_CARE_PROVIDER_SITE_OTHER): Payer: Medicaid Other

## 2022-06-13 ENCOUNTER — Ambulatory Visit: Payer: Medicaid Other | Admitting: Sports Medicine

## 2022-06-13 DIAGNOSIS — M7542 Impingement syndrome of left shoulder: Secondary | ICD-10-CM

## 2022-06-13 MED ORDER — GADOBUTROL 1 MMOL/ML IV SOLN
1.0000 mL | Freq: Once | INTRAVENOUS | Status: AC | PRN
  Administered 2022-06-13: 1 mL

## 2022-06-13 NOTE — Assessment & Plan Note (Addendum)
Mekesha returns, she is a very pleasant 56 year old female, history of shoulder impingement syndrome, postop from arthroscopic debridement with Dr. Everardo Pacific.  At the last visit we saw her, she had had a fall and was having worsening pain with impingement signs on exam. I did suspect there was a labral tear so today I performed an arthrogram injection into the glenohumeral joint, we will keep an eye out for the MRI results.

## 2022-06-13 NOTE — Progress Notes (Signed)
    Procedures performed today:    Procedure: Real-time Ultrasound Guided gadolinium contrast injection of left glenohumeral joint Device: Samsung HS60  Verbal informed consent obtained.  Time-out conducted.  Noted no overlying erythema, induration, or other signs of local infection.  Skin prepped in a sterile fashion.  Local anesthesia: Topical Ethyl chloride.  With sterile technique and under real time ultrasound guidance: Noted trace synovitis, 1 cc of 40, 2 cc lidocaine, 2 cc bupivacaine injected, syringe switched and 0.1 cc gadolinium injected, syringe again switched and 10 cc sterile saline used to fully distend the joint. Joint visualized and capsule seen distending confirming intra-articular placement of contrast material and medication. Completed without difficulty  Advised to call if fevers/chills, erythema, induration, drainage, or persistent bleeding.  Images permanently stored in PACS Impression: Technically successful ultrasound guided gadolinium contrast injection for MR arthrography.  Please see separate MR arthrogram report.  Independent interpretation of notes and tests performed by another provider:   None.  Brief History, Exam, Impression, and Recommendations:    Impingement syndrome, shoulder, left Patricia Carrillo returns, she is a very pleasant 56 year old female, history of shoulder impingement syndrome, postop from arthroscopic debridement with Dr. Everardo Pacific.  At the last visit we saw her, she had had a fall and was having worsening pain with impingement signs on exam. I did suspect there was a labral tear so today I performed an arthrogram injection into the glenohumeral joint, we will keep an eye out for the MRI results.    ____________________________________________ Ihor Austin. Benjamin Stain, M.D., ABFM., CAQSM., AME. Primary Care and Sports Medicine [place] [facility]  Adjunct Professor of Family Medicine  [place] of [facility] [facility] [employer]  [employer]  Restaurant manager, fast food

## 2022-06-19 ENCOUNTER — Encounter: Payer: Self-pay | Admitting: Sports Medicine

## 2022-07-19 ENCOUNTER — Ambulatory Visit: Payer: Medicaid Other | Admitting: Sports Medicine

## 2022-07-19 ENCOUNTER — Encounter: Payer: Self-pay | Admitting: Sports Medicine

## 2022-07-19 ENCOUNTER — Ambulatory Visit (INDEPENDENT_AMBULATORY_CARE_PROVIDER_SITE_OTHER): Payer: Medicaid Other

## 2022-07-19 DIAGNOSIS — M170 Bilateral primary osteoarthritis of knee: Secondary | ICD-10-CM

## 2022-07-19 MED ORDER — ROLLING WALKER/BURGUNDY MISC: 0 refills | Status: AC

## 2022-07-19 NOTE — Assessment & Plan Note (Signed)
This pleasant 56 year old female returns, she has bilateral knee osteoarthritis, she is post partial meniscectomy, she had some steroid injections but has never had viscosupplementation, she has seen an orthopedic surgeon in the past who suggested knee arthroplasty but could not get it due to her BMI. She is working on weight loss. She is been having some episodes with her knees buckling and giving way. Her knee exam is for the most part normal with the exception of significant medial joint line pain. She is also very weak to extension. Ankle dorsiflexion is strong. I think she is simply deconditioned, she needs aggressive physical therapy, will do this in medicine, I injected both of her knees today. We will get her a walker for support until PT finishes. Continue the weight loss journey. Return to see me in 8 weeks.

## 2022-07-19 NOTE — Progress Notes (Signed)
    Procedures performed today:    Procedure: Real-time Ultrasound Guided injection of the left knee Device: Samsung HS60  Verbal informed consent obtained.  Time-out conducted.  Noted no overlying erythema, induration, or other signs of local infection.  Skin prepped in a sterile fashion.  Local anesthesia: Topical Ethyl chloride.  With sterile technique and under real time ultrasound guidance: Mild effusion noted, 1 cc Kenalog 40, 2 cc lidocaine, 2 cc bupivacaine injected easily Completed without difficulty  Advised to call if fevers/chills, erythema, induration, drainage, or persistent bleeding.  Images permanently stored and available for review in PACS.  Impression: Technically successful ultrasound guided injection.  Procedure: Real-time Ultrasound Guided injection of the right knee Device: Samsung HS60  Verbal informed consent obtained.  Time-out conducted.  Noted no overlying erythema, induration, or other signs of local infection.  Skin prepped in a sterile fashion.  Local anesthesia: Topical Ethyl chloride.  With sterile technique and under real time ultrasound guidance: Mild effusion noted, 1 cc Kenalog 40, 2 cc lidocaine, 2 cc bupivacaine injected easily Completed without difficulty  Advised to call if fevers/chills, erythema, induration, drainage, or persistent bleeding.  Images permanently stored and available for review in PACS.  Impression: Technically successful ultrasound guided injection.  Independent interpretation of notes and tests performed by another provider:   None.  Brief History, Exam, Impression, and Recommendations:    Primary osteoarthritis of both knees This pleasant 56 year old female returns, she has bilateral knee osteoarthritis, she is post partial meniscectomy, she had some steroid injections but has never had viscosupplementation, she has seen an orthopedic surgeon in the past who suggested knee arthroplasty but could not get it due to her  BMI. She is working on weight loss. She is been having some episodes with her knees buckling and giving way. Her knee exam is for the most part normal with the exception of significant medial joint line pain. She is also very weak to extension. Ankle dorsiflexion is strong. I think she is simply deconditioned, she needs aggressive physical therapy, will do this in medicine, I injected both of her knees today. We will get her a walker for support until PT finishes. Continue the weight loss journey. Return to see me in 8 weeks.    ____________________________________________ Gwen Her. Dianah Field, M.D., ABFM., CAQSM., AME. Primary Care and Sports Medicine [place] [facility]  Adjunct Professor of [place] of [facility] [employer]  Risk manager

## 2022-08-09 ENCOUNTER — Other Ambulatory Visit: Payer: Self-pay

## 2022-08-09 ENCOUNTER — Encounter (HOSPITAL_BASED_OUTPATIENT_CLINIC_OR_DEPARTMENT_OTHER): Payer: Self-pay | Admitting: Orthopaedic Surgery

## 2022-08-14 ENCOUNTER — Encounter (HOSPITAL_BASED_OUTPATIENT_CLINIC_OR_DEPARTMENT_OTHER)
Admission: RE | Admit: 2022-08-14 | Discharge: 2022-08-14 | Disposition: A | Discharge status: Home / Self Care | Payer: Medicaid Other | Source: Ambulatory Visit | Attending: Orthopaedic Surgery | Admitting: Orthopaedic Surgery

## 2022-08-14 DIAGNOSIS — Z01818 Encounter for other preprocedural examination: Secondary | ICD-10-CM | POA: Diagnosis present

## 2022-08-14 LAB — BASIC METABOLIC PANEL
Anion gap: 6 (ref 5–15)
BUN: 18 mg/dL (ref 6–20)
CO2: 27 mmol/L (ref 22–32)
Calcium: 9.1 mg/dL (ref 8.9–10.3)
Chloride: 111 mmol/L (ref 98–111)
Creatinine, Ser: 0.96 mg/dL (ref 0.44–1.00)
GFR, Estimated: >60 mL/min (ref >60)
Glucose, Bld: 98 mg/dL (ref 70–99)
Potassium: 3.4 mmol/L — ABNORMAL LOW (ref 3.5–5.1)
Sodium: 144 mmol/L (ref 135–145)

## 2022-08-14 LAB — SURGICAL PCR SCREEN
MRSA, PCR: NEGATIVE
Staphylococcus aureus: NEGATIVE

## 2022-08-16 NOTE — H&P (Signed)
PREOPERATIVE H&P  Chief Complaint: left shoulder DJD  HPI: Patricia Carrillo is a 56 y.o. female who is scheduled for Procedure(s): REVERSE SHOULDER ARTHROPLASTY.   Patient has a past medical history significant for CKD, HTN, sleep apnea, chronic pain.   Patient had a left single anchor cuff repair on April 20th 2023. She did well after surgery until she had an injury. She fell down stairs and landed on her shoulder. She continued to have pain after this.   Symptoms are rated as moderate to severe, and have been worsening.  This is significantly impairing activities of daily living.    Please see clinic note for further details on this patient's care.    She has elected for surgical management.   Past Medical History:  Diagnosis Date   Anxiety    Chronic kidney disease    "stage 3" per pt   Fatigue    Headache    Hypertension    Obesity 01/16/2022   takes metformin and ozempic "for weight loss, not diabetic"   Sleep apnea    dx 15 years ago, never used CPAP   Past Surgical History:  Procedure Laterality Date   CHOLECYSTECTOMY     LAPAROSCOPIC HYSTERECTOMY     SHOULDER ARTHROSCOPY WITH DISTAL CLAVICLE RESECTION Left 01/26/2022   Procedure: SHOULDER ARTHROSCOPY WITH DISTAL CLAVICLE EXCISION/ DEBRIDEMENT;  Surgeon: Bjorn Pippin, MD;  Location: [facility];  Service: Orthopedics;  Laterality: Left;   SHOULDER ARTHROSCOPY WITH SUBACROMIAL DECOMPRESSION, ROTATOR CUFF REPAIR AND BICEP TENDON REPAIR Left 01/26/2022   Procedure: SHOULDER ARTHROSCOPY WITH SUBACROMIAL DECOMPRESSION, ROTATOR CUFF REPAIR;  Surgeon: Bjorn Pippin, MD;  Location: [facility];  Service: Orthopedics;  Laterality: Left;   TONSILLECTOMY     TUBAL LIGATION     Social History   Socioeconomic History   Marital status: Divorced    Spouse name: Not on file   Number of children: 4   Years of education: Not on file   Highest education level: Not on file  Occupational History    Not on file  Tobacco Use   Smoking status: Never   Smokeless tobacco: Never  Vaping Use   Vaping Use: Never used  Substance and Sexual Activity   Alcohol use: Not Currently   Drug use: Not on file   Sexual activity: Not on file  Other Topics Concern   Not on file  Social History Narrative   Not on file   Social Determinants of Health   Financial Resource Strain: Not on file  Food Insecurity: Not on file  Transportation Needs: Not on file  Physical Activity: Not on file  Stress: Not on file  Social Connections: Not on file   History reviewed. No pertinent family history. No Known Allergies Prior to Admission medications   Medication Sig Start Date End Date Taking? Authorizing Provider  lovastatin (MEVACOR) 20 MG tablet Take 20 mg by mouth daily. 02/21/21  Yes [provider]  OXYCODONE HCL PO Take by mouth.   Yes [provider]  Semaglutide (OZEMPIC, 0.25 OR 0.5 MG/DOSE, [place]) Inject into the skin.   Yes [provider]  tiZANidine (ZANAFLEX) 4 MG tablet Take 4 mg by mouth 3 (three) times daily. 01/11/22  Yes [provider]  gabapentin (NEURONTIN) 800 MG tablet Take 1 tablet (800 mg total) by mouth 3 (three) times daily. 01/26/22 02/25/22  Vernetta Honey, PA-C  lisinopril-hydrochlorothiazide (ZESTORETIC) 20-12.5 MG tablet Take 1 tablet by mouth 2 (  two) times daily. 02/21/21   [provider]  Misc. Devices (ROLLING [facility]) MISC Rolling walker for daily use 07/19/22   Monica Becton, MD    ROS: All other systems have been reviewed and were otherwise negative with the exception of those mentioned in the HPI and as above.  Physical Exam: General: Alert, no acute distress Cardiovascular: No pedal edema Respiratory: No cyanosis, no use of accessory musculature GI: No organomegaly, abdomen is soft and non-tender Skin: No lesions in the area of chief complaint Neurologic: Sensation intact distally Psychiatric: Patient  is competent for consent with normal mood and affect Lymphatic: No axillary or cervical lymphadenopathy  MUSCULOSKELETAL:  On examination the range of motion of the shoulder is about 0-90 degrees. The patient declined on external rotation and internal rotation testing. She is weak and painful with supraspinatus testing.   Imaging: MRI which demonstrates a repeat tear of the cuff in a new location leaving a significant amount of tendon on the bone.   BMI: Estimated body mass index is 41.57 kg/m as calculated from the following:   Height as of this encounter: 5\' 1"  (1.549 m).   Weight as of this encounter: 99.8 kg.  No results found for: "ALBUMIN" Diabetes: Patient does not have a diagnosis of diabetes.     Smoking Status:   reports that she has never smoked. She has never used smokeless tobacco.     Assessment: left shoulder DJD  Plan: Plan for Procedure(s): REVERSE SHOULDER ARTHROPLASTY  The risks benefits and alternatives were discussed with the patient including but not limited to the risks of nonoperative treatment, versus surgical intervention including infection, bleeding, nerve injury,  blood clots, cardiopulmonary complications, morbidity, mortality, among others, and they were willing to proceed.   We additionally specifically discussed risks of axillary nerve injury, infection, periprosthetic fracture, continued pain and longevity of implants prior to beginning procedure.    Patient will be closely monitored in PACU for medical stabilization and pain control. If found stable in PACU, patient may be discharged home with outpatient follow-up. If any concerns regarding patient's stabilization patient will be admitted for observation after surgery. The patient is planning to be discharged home with outpatient PT.   The patient acknowledged the explanation, agreed to proceed with the plan and consent was signed.   She received operative clearance from her nephrologist,  Dr. .   Operative Plan: Left reverse total shoulder arthroplasty Discharge Medications: Tylenol, increase Gabapentin, muscle relaxer, zofran (already on high dose oxycodone)  DVT Prophylaxis: Aspirin Physical Therapy: Outpatient PT Special Discharge needs: Sling. IceMan   Westley Hummer, PA-C  08/16/2022 7:43 AM

## 2022-08-16 NOTE — Anesthesia Preprocedure Evaluation (Signed)
Anesthesia Evaluation  Patient identified by MRN, date of birth, ID band Patient awake    Reviewed: Allergy & Precautions, NPO status , Patient's Chart, lab work & pertinent test results  History of Anesthesia Complications Negative for: history of anesthetic complications  Airway Mallampati: II  TM Distance: <3 FB Neck ROM: Full    Dental  (+) Partial Upper, Dental Advisory Given,    Pulmonary neg shortness of breath, sleep apnea , neg COPD, neg recent URI   breath sounds clear to auscultation       Cardiovascular hypertension, Pt. on medications (-) angina (-) Past MI and (-) CHF  Rhythm:Regular  Not taking meds regularly for hypotension   Neuro/Psych  Headaches  Anxiety        GI/Hepatic negative GI ROS, Neg liver ROS,,,  Endo/Other    Morbid obesityOzempic for weight loss, last took two weeks ago per patient  Renal/GU Renal InsufficiencyRenal disease  negative genitourinary   Musculoskeletal  (+) Arthritis ,    Abdominal   Peds  Hematology negative hematology ROS (+) No results found for: "WBC", "HGB", "HCT", "MCV", "PLT"    Anesthesia Other Findings Day of surgery medications reviewed with patient.  Reproductive/Obstetrics negative OB ROS                             Anesthesia Physical Anesthesia Plan  ASA: 2  Anesthesia Plan: General and Regional   Post-op Pain Management: Regional block* and Tylenol PO (pre-op)*   Induction: Intravenous  PONV Risk Score and Plan: 3 and Treatment may vary due to age or medical condition, Midazolam, Dexamethasone, Ondansetron and Scopolamine patch - Pre-op  Airway Management Planned: Oral ETT  Additional Equipment: None  Intra-op Plan:   Post-operative Plan: Extubation in OR  Informed Consent: I have reviewed the patients History and Physical, chart, labs and discussed the procedure including the risks, benefits and alternatives for the  proposed anesthesia with the patient or authorized representative who has indicated his/her understanding and acceptance.     Dental advisory given  Plan Discussed with: CRNA  Anesthesia Plan Comments:        Anesthesia Quick Evaluation

## 2022-08-17 ENCOUNTER — Other Ambulatory Visit: Payer: Self-pay

## 2022-08-17 ENCOUNTER — Ambulatory Visit (HOSPITAL_BASED_OUTPATIENT_CLINIC_OR_DEPARTMENT_OTHER): Payer: Medicaid Other | Admitting: Anesthesiology

## 2022-08-17 ENCOUNTER — Encounter (HOSPITAL_BASED_OUTPATIENT_CLINIC_OR_DEPARTMENT_OTHER)
Admission: RE | Disposition: A | Discharge status: Home / Self Care | Payer: Self-pay | Source: Home / Self Care | Attending: Orthopaedic Surgery

## 2022-08-17 ENCOUNTER — Encounter (HOSPITAL_BASED_OUTPATIENT_CLINIC_OR_DEPARTMENT_OTHER): Payer: Self-pay | Admitting: Orthopaedic Surgery

## 2022-08-17 ENCOUNTER — Ambulatory Visit (HOSPITAL_COMMUNITY): Payer: Medicaid Other

## 2022-08-17 ENCOUNTER — Ambulatory Visit (HOSPITAL_BASED_OUTPATIENT_CLINIC_OR_DEPARTMENT_OTHER)
Admission: RE | Admit: 2022-08-17 | Discharge: 2022-08-17 | Disposition: A | Discharge status: Home / Self Care | Payer: Medicaid Other | Attending: Orthopaedic Surgery | Admitting: Orthopaedic Surgery

## 2022-08-17 DIAGNOSIS — W109XXA Fall (on) (from) unspecified stairs and steps, initial encounter: Secondary | ICD-10-CM | POA: Diagnosis not present

## 2022-08-17 DIAGNOSIS — I129 Hypertensive chronic kidney disease with stage 1 through stage 4 chronic kidney disease, or unspecified chronic kidney disease: Secondary | ICD-10-CM | POA: Diagnosis not present

## 2022-08-17 DIAGNOSIS — Z79899 Other long term (current) drug therapy: Secondary | ICD-10-CM | POA: Diagnosis not present

## 2022-08-17 DIAGNOSIS — Z01818 Encounter for other preprocedural examination: Secondary | ICD-10-CM

## 2022-08-17 DIAGNOSIS — S46012A Strain of muscle(s) and tendon(s) of the rotator cuff of left shoulder, initial encounter: Secondary | ICD-10-CM | POA: Diagnosis present

## 2022-08-17 DIAGNOSIS — Z6841 Body Mass Index (BMI) 40.0 and over, adult: Secondary | ICD-10-CM | POA: Insufficient documentation

## 2022-08-17 DIAGNOSIS — Z7985 Long-term (current) use of injectable non-insulin antidiabetic drugs: Secondary | ICD-10-CM | POA: Diagnosis not present

## 2022-08-17 DIAGNOSIS — G4730 Sleep apnea, unspecified: Secondary | ICD-10-CM | POA: Diagnosis not present

## 2022-08-17 DIAGNOSIS — M19012 Primary osteoarthritis, left shoulder: Secondary | ICD-10-CM | POA: Insufficient documentation

## 2022-08-17 DIAGNOSIS — N189 Chronic kidney disease, unspecified: Secondary | ICD-10-CM | POA: Insufficient documentation

## 2022-08-17 DIAGNOSIS — G8929 Other chronic pain: Secondary | ICD-10-CM | POA: Diagnosis not present

## 2022-08-17 HISTORY — PX: REVERSE SHOULDER ARTHROPLASTY: SHX5054

## 2022-08-17 SURGERY — ARTHROPLASTY, SHOULDER, TOTAL, REVERSE
Anesthesia: General | Site: Shoulder | Laterality: Left

## 2022-08-17 MED ORDER — ROCURONIUM BROMIDE 100 MG/10ML IV SOLN
INTRAVENOUS | Status: DC | PRN
  Administered 2022-08-17: 70 mg via INTRAVENOUS

## 2022-08-17 MED ORDER — CELECOXIB 100 MG PO CAPS: 100.0000 mg | ORAL_CAPSULE | Freq: Two times a day (BID) | ORAL | 0 refills | Status: AC

## 2022-08-17 MED ORDER — CEFAZOLIN SODIUM-DEXTROSE 2-4 GM/100ML-% IV SOLN
2.0000 g | INTRAVENOUS | Status: AC
  Administered 2022-08-17: 2 g via INTRAVENOUS

## 2022-08-17 MED ORDER — ACETAMINOPHEN 500 MG PO TABS: 1000.0000 mg | ORAL_TABLET | Freq: Once | ORAL | Status: DC

## 2022-08-17 MED ORDER — FENTANYL CITRATE (PF) 100 MCG/2ML IJ SOLN
INTRAMUSCULAR | Status: AC
  Filled 2022-08-17: qty 2

## 2022-08-17 MED ORDER — DEXAMETHASONE SODIUM PHOSPHATE 10 MG/ML IJ SOLN
INTRAMUSCULAR | Status: AC
  Filled 2022-08-17: qty 1

## 2022-08-17 MED ORDER — ACETAMINOPHEN 500 MG PO TABS
ORAL_TABLET | ORAL | Status: AC
  Filled 2022-08-17: qty 2

## 2022-08-17 MED ORDER — ONDANSETRON HCL 4 MG/2ML IJ SOLN
INTRAMUSCULAR | Status: DC | PRN
  Administered 2022-08-17: 4 mg via INTRAVENOUS

## 2022-08-17 MED ORDER — LIDOCAINE 2% (20 MG/ML) 5 ML SYRINGE
INTRAMUSCULAR | Status: AC
  Filled 2022-08-17: qty 5

## 2022-08-17 MED ORDER — LACTATED RINGERS IV SOLN: INTRAVENOUS | Status: DC

## 2022-08-17 MED ORDER — FENTANYL CITRATE (PF) 100 MCG/2ML IJ SOLN
INTRAMUSCULAR | Status: DC | PRN
  Administered 2022-08-17: 100 ug via INTRAVENOUS

## 2022-08-17 MED ORDER — SCOPOLAMINE 1 MG/3DAYS TD PT72
1.0000 | MEDICATED_PATCH | Freq: Once | TRANSDERMAL | Status: DC
  Administered 2022-08-17: 1.5 mg via TRANSDERMAL

## 2022-08-17 MED ORDER — LACTATED RINGERS IV BOLUS: 250.0000 mL | Freq: Once | INTRAVENOUS | Status: DC

## 2022-08-17 MED ORDER — TRANEXAMIC ACID-NACL 1000-0.7 MG/100ML-% IV SOLN
INTRAVENOUS | Status: AC
  Filled 2022-08-17: qty 100

## 2022-08-17 MED ORDER — PHENYLEPHRINE HCL-NACL 20-0.9 MG/250ML-% IV SOLN
INTRAVENOUS | Status: DC | PRN
  Administered 2022-08-17: 50 ug/min via INTRAVENOUS

## 2022-08-17 MED ORDER — CEFAZOLIN SODIUM-DEXTROSE 2-4 GM/100ML-% IV SOLN
INTRAVENOUS | Status: AC
  Filled 2022-08-17: qty 100

## 2022-08-17 MED ORDER — SUGAMMADEX SODIUM 200 MG/2ML IV SOLN
INTRAVENOUS | Status: DC | PRN
  Administered 2022-08-17: 200 mg via INTRAVENOUS

## 2022-08-17 MED ORDER — GABAPENTIN 300 MG PO CAPS: 300.0000 mg | ORAL_CAPSULE | Freq: Once | ORAL | Status: DC

## 2022-08-17 MED ORDER — BUPIVACAINE-EPINEPHRINE (PF) 0.5% -1:200000 IJ SOLN
INTRAMUSCULAR | Status: DC | PRN
  Administered 2022-08-17: 20 mL via PERINEURAL

## 2022-08-17 MED ORDER — ASPIRIN 81 MG PO CHEW: 81.0000 mg | CHEWABLE_TABLET | Freq: Two times a day (BID) | ORAL | 0 refills | Status: AC

## 2022-08-17 MED ORDER — GABAPENTIN 300 MG PO CAPS
ORAL_CAPSULE | ORAL | Status: AC
  Filled 2022-08-17: qty 1

## 2022-08-17 MED ORDER — LIDOCAINE HCL (CARDIAC) PF 100 MG/5ML IV SOSY
PREFILLED_SYRINGE | INTRAVENOUS | Status: DC | PRN
  Administered 2022-08-17: 40 mg via INTRAVENOUS

## 2022-08-17 MED ORDER — FENTANYL CITRATE (PF) 100 MCG/2ML IJ SOLN: 25.0000 ug | INTRAMUSCULAR | Status: DC | PRN

## 2022-08-17 MED ORDER — VANCOMYCIN HCL 1000 MG IV SOLR
INTRAVENOUS | Status: DC | PRN
  Administered 2022-08-17: 1000 mg via TOPICAL

## 2022-08-17 MED ORDER — OXYCODONE HCL 5 MG PO TABS: 5.0000 mg | ORAL_TABLET | Freq: Once | ORAL | Status: DC | PRN

## 2022-08-17 MED ORDER — PROPOFOL 10 MG/ML IV BOLUS
INTRAVENOUS | Status: DC | PRN
  Administered 2022-08-17: 130 mg via INTRAVENOUS

## 2022-08-17 MED ORDER — PHENYLEPHRINE HCL (PRESSORS) 10 MG/ML IV SOLN
INTRAVENOUS | Status: DC | PRN
  Administered 2022-08-17: 80 ug via INTRAVENOUS
  Administered 2022-08-17: 160 ug via INTRAVENOUS

## 2022-08-17 MED ORDER — FENTANYL CITRATE (PF) 100 MCG/2ML IJ SOLN
100.0000 ug | Freq: Once | INTRAMUSCULAR | Status: AC
  Administered 2022-08-17: 50 ug via INTRAVENOUS

## 2022-08-17 MED ORDER — MIDAZOLAM HCL 2 MG/2ML IJ SOLN
INTRAMUSCULAR | Status: AC
  Filled 2022-08-17: qty 2

## 2022-08-17 MED ORDER — OMEPRAZOLE 20 MG PO CPDR: 20.0000 mg | DELAYED_RELEASE_CAPSULE | Freq: Every day | ORAL | 0 refills | Status: AC

## 2022-08-17 MED ORDER — LACTATED RINGERS IV BOLUS
500.0000 mL | Freq: Once | INTRAVENOUS | Status: AC
  Administered 2022-08-17: 500 mL via INTRAVENOUS

## 2022-08-17 MED ORDER — AMISULPRIDE (ANTIEMETIC) 5 MG/2ML IV SOLN: 10.0000 mg | Freq: Once | INTRAVENOUS | Status: DC | PRN

## 2022-08-17 MED ORDER — PROMETHAZINE HCL 12.5 MG PO TABS: 12.5000 mg | ORAL_TABLET | Freq: Four times a day (QID) | ORAL | 0 refills | Status: DC | PRN

## 2022-08-17 MED ORDER — DEXAMETHASONE SODIUM PHOSPHATE 4 MG/ML IJ SOLN
INTRAMUSCULAR | Status: DC | PRN
  Administered 2022-08-17: 10 mg via INTRAVENOUS

## 2022-08-17 MED ORDER — PHENYLEPHRINE HCL (PRESSORS) 10 MG/ML IV SOLN
INTRAVENOUS | Status: AC
  Filled 2022-08-17: qty 1

## 2022-08-17 MED ORDER — MIDAZOLAM HCL 2 MG/2ML IJ SOLN
2.0000 mg | Freq: Once | INTRAMUSCULAR | Status: AC
  Administered 2022-08-17: 1 mg via INTRAVENOUS

## 2022-08-17 MED ORDER — SCOPOLAMINE 1 MG/3DAYS TD PT72
MEDICATED_PATCH | TRANSDERMAL | Status: AC
  Filled 2022-08-17: qty 1

## 2022-08-17 MED ORDER — ONDANSETRON HCL 4 MG/2ML IJ SOLN
INTRAMUSCULAR | Status: AC
  Filled 2022-08-17: qty 2

## 2022-08-17 MED ORDER — EPHEDRINE 5 MG/ML INJ
INTRAVENOUS | Status: AC
  Filled 2022-08-17: qty 5

## 2022-08-17 MED ORDER — TRANEXAMIC ACID-NACL 1000-0.7 MG/100ML-% IV SOLN
1000.0000 mg | INTRAVENOUS | Status: AC
  Administered 2022-08-17: 1000 mg via INTRAVENOUS

## 2022-08-17 MED ORDER — 0.9 % SODIUM CHLORIDE (POUR BTL) OPTIME
TOPICAL | Status: DC | PRN
  Administered 2022-08-17: 2000 mL

## 2022-08-17 MED ORDER — ACETAMINOPHEN 500 MG PO TABS
1000.0000 mg | ORAL_TABLET | Freq: Once | ORAL | Status: AC
  Administered 2022-08-17: 1000 mg via ORAL

## 2022-08-17 MED ORDER — ACETAMINOPHEN 500 MG PO TABS: 1000.0000 mg | ORAL_TABLET | Freq: Three times a day (TID) | ORAL | 0 refills | Status: AC

## 2022-08-17 MED ORDER — TRAMADOL HCL 50 MG PO TABS: 50.0000 mg | ORAL_TABLET | Freq: Four times a day (QID) | ORAL | 0 refills | Status: DC | PRN

## 2022-08-17 MED ORDER — PROPOFOL 10 MG/ML IV BOLUS
INTRAVENOUS | Status: AC
  Filled 2022-08-17: qty 20

## 2022-08-17 MED ORDER — OXYCODONE HCL 5 MG/5ML PO SOLN: 5.0000 mg | Freq: Once | ORAL | Status: DC | PRN

## 2022-08-17 MED ORDER — ROCURONIUM BROMIDE 10 MG/ML (PF) SYRINGE
PREFILLED_SYRINGE | INTRAVENOUS | Status: AC
  Filled 2022-08-17: qty 10

## 2022-08-17 MED ORDER — BUPIVACAINE LIPOSOME 1.3 % IJ SUSP
INTRAMUSCULAR | Status: DC | PRN
  Administered 2022-08-17: 10 mL via PERINEURAL

## 2022-08-17 SURGICAL SUPPLY — 65 items
AID PSTN UNV HD RSTRNT DISP (MISCELLANEOUS) ×1
APL PRP STRL LF DISP 70% ISPRP (MISCELLANEOUS) ×1
BASEPLATE GLENOID RSA 3X25 0D (Shoulder) IMPLANT
BIT DRILL 3.2 PERIPHERAL SCREW (BIT) IMPLANT
BLADE SAW SGTL 73X25 THK (BLADE) ×1 IMPLANT
BLADE SURG 10 STRL SS (BLADE) IMPLANT
BLADE SURG 15 STRL LF DISP TIS (BLADE) IMPLANT
BLADE SURG 15 STRL SS (BLADE)
BRUSH SCRUB EZ PLAIN DRY (MISCELLANEOUS) ×1 IMPLANT
BSPLAT GLND +3 25 (Shoulder) ×1 IMPLANT
CHLORAPREP W/TINT 26 (MISCELLANEOUS) ×1 IMPLANT
CLSR STERI-STRIP ANTIMIC 1/2X4 (GAUZE/BANDAGES/DRESSINGS) ×1 IMPLANT
COOLER ICEMAN CLASSIC (MISCELLANEOUS) ×1 IMPLANT
COVER BACK TABLE 60X90IN (DRAPES) ×1 IMPLANT
COVER MAYO STAND STRL (DRAPES) ×1 IMPLANT
DRAPE IMP U-DRAPE 54X76 (DRAPES) IMPLANT
DRAPE INCISE IOBAN 66X45 STRL (DRAPES) ×1 IMPLANT
DRAPE POUCH INSTRU U-SHP 10X18 (DRAPES) ×1 IMPLANT
DRAPE U-SHAPE 76X120 STRL (DRAPES) ×2 IMPLANT
DRSG AQUACEL AG ADV 3.5X 6 (GAUZE/BANDAGES/DRESSINGS) ×1 IMPLANT
ELECT BLADE 4.0 EZ CLEAN MEGAD (MISCELLANEOUS) ×1
ELECT REM PT RETURN 9FT ADLT (ELECTROSURGICAL) ×1
ELECTRODE BLDE 4.0 EZ CLN MEGD (MISCELLANEOUS) ×1 IMPLANT
ELECTRODE REM PT RTRN 9FT ADLT (ELECTROSURGICAL) ×1 IMPLANT
FACESHIELD WRAPAROUND (MASK) ×2 IMPLANT
FACESHIELD WRAPAROUND OR TEAM (MASK) ×2 IMPLANT
GLENOSPHERE REV SHOULDER 36 (Joint) IMPLANT
GLOVE BIO SURGEON STRL SZ 6.5 (GLOVE) ×2 IMPLANT
GLOVE BIOGEL PI IND STRL 6.5 (GLOVE) ×1 IMPLANT
GLOVE BIOGEL PI IND STRL 8 (GLOVE) ×1 IMPLANT
GLOVE ECLIPSE 8.0 STRL XLNG CF (GLOVE) ×2 IMPLANT
GOWN STRL REUS W/ TWL LRG LVL3 (GOWN DISPOSABLE) ×2 IMPLANT
GOWN STRL REUS W/TWL LRG LVL3 (GOWN DISPOSABLE) ×2
GOWN STRL REUS W/TWL XL LVL3 (GOWN DISPOSABLE) ×1 IMPLANT
GUIDE PIN 3X75 SHOULDER (PIN) ×1
GUIDEWIRE GLENOID 2.5X220 (WIRE) IMPLANT
HANDPIECE INTERPULSE COAX TIP (DISPOSABLE) ×1
INSERT REVERSED HUMERAL SIZE 1 (Orthopedic Implant) IMPLANT
KIT STABILIZATION SHOULDER (MISCELLANEOUS) ×1 IMPLANT
MANIFOLD NEPTUNE II (INSTRUMENTS) ×1 IMPLANT
PACK BASIN DAY SURGERY FS (CUSTOM PROCEDURE TRAY) ×1 IMPLANT
PACK SHOULDER (CUSTOM PROCEDURE TRAY) ×1 IMPLANT
PAD COLD SHLDR WRAP-ON (PAD) ×1 IMPLANT
PIN GUIDE 3X75 SHOULDER (PIN) IMPLANT
RESTRAINT HEAD UNIVERSAL NS (MISCELLANEOUS) ×1 IMPLANT
SCREW 5.0X38 SMALL F/PERFORM (Screw) IMPLANT
SCREW BONE INTRNL SM 7 (Screw) IMPLANT
SCREW PERIPHERAL 5.0X34 (Screw) IMPLANT
SET HNDPC FAN SPRY TIP SCT (DISPOSABLE) ×1 IMPLANT
SHEET MEDIUM DRAPE 40X70 STRL (DRAPES) ×1 IMPLANT
SLEEVE SCD COMPRESS KNEE MED (STOCKING) ×1 IMPLANT
SPIKE FLUID TRANSFER (MISCELLANEOUS) IMPLANT
SPONGE T-LAP 18X18 ~~LOC~~+RFID (SPONGE) ×1 IMPLANT
STEM HUMERAL SHORT STD SZ1 (Orthopedic Implant) IMPLANT
SUT ETHIBOND 2 V 37 (SUTURE) ×1 IMPLANT
SUT ETHIBOND NAB CT1 #1 30IN (SUTURE) ×1 IMPLANT
SUT FIBERWIRE #5 38 CONV NDL (SUTURE) ×4
SUT MNCRL AB 4-0 PS2 18 (SUTURE) ×1 IMPLANT
SUT VIC AB 0 CT1 27 (SUTURE) ×1
SUT VIC AB 0 CT1 27XBRD ANBCTR (SUTURE) IMPLANT
SUT VIC AB 3-0 SH 27 (SUTURE) ×1
SUT VIC AB 3-0 SH 27X BRD (SUTURE) ×1 IMPLANT
SUTURE FIBERWR #5 38 CONV NDL (SUTURE) ×4 IMPLANT
TOWEL GREEN STERILE FF (TOWEL DISPOSABLE) ×3 IMPLANT
TUBE SUCTION HIGH CAP CLEAR NV (SUCTIONS) ×1 IMPLANT

## 2022-08-17 NOTE — Transfer of Care (Signed)
Immediate Anesthesia Transfer of Care Note  Patient: Patricia Carrillo  Procedure(s) Performed: REVERSE SHOULDER ARTHROPLASTY (Left: Shoulder)  Patient Location: PACU  Anesthesia Type:GA combined with regional for post-op pain  Level of Consciousness: drowsy and patient cooperative  Airway & Oxygen Therapy: Patient Spontanous Breathing and Patient connected to face mask oxygen  Post-op Assessment: Report given to RN and Post -op Vital signs reviewed and stable  Post vital signs: Reviewed and stable  Last Vitals:  Vitals Value Taken Time  BP    Temp 36.8 C 08/17/22 1115  Pulse 84 08/17/22 1115  Resp 18 08/17/22 1115  SpO2 100 % 08/17/22 1115  Vitals shown include unvalidated device data.  Last Pain:  Vitals:   08/17/22 0810  TempSrc: Oral  PainSc: 7       Patients Stated Pain Goal: 10 (08/17/22 0810)  Complications: No notable events documented.

## 2022-08-17 NOTE — Discharge Instructions (Addendum)
Ramond Marrow MD, MPH Alfonse Alpers, PA-C [facility] [address] [phone] (tel)   [phone] (fax)   POST-OPERATIVE INSTRUCTIONS - TOTAL SHOULDER REPLACEMENT    WOUND CARE You may leave the operative dressing in place until your follow-up appointment. KEEP THE INCISIONS CLEAN AND DRY. There may be a small amount of fluid/bleeding leaking at the surgical site. This is normal after surgery.  If it fills with liquid or blood please call us immediately to change it for you. Use the provided ice machine or Ice packs as often as possible for the first 3-4 days, then as needed for pain relief.   Keep a layer of cloth or a shirt between your skin and the cooling unit to prevent frost bite as it can get very cold.  SHOWERING: - You may shower on Post-Op Day #2.  - The dressing is water resistant but do not scrub it as it may start to peel up.   - You may remove the sling for showering - Gently pat the area dry.  - Do not soak the shoulder in water.  - Do not go swimming in the pool or ocean until your incision has completely healed (about 4-6 weeks after surgery) - KEEP THE INCISIONS CLEAN AND DRY.  EXERCISES Wear the sling at all times  You may remove the sling for showering, but keep the arm across the chest or in a secondary sling.    Accidental/Purposeful External Rotation and shoulder flexion (reaching behind you) is to be avoided at all costs for the first month. It is ok to come out of your sling if your are sitting and have assistance for eating.   Do not lift anything heavier than 1 pound until we discuss it further in clinic.  It is normal for your fingers/hand to become more swollen after surgery and discolored from bruising.   This will resolve over the first few weeks usually after surgery. Please continue to ambulate and do not stay sitting or lying for too long.  Perform foot and wrist pumps to assist in circulation.  PHYSICAL  THERAPY - You will begin physical therapy soon after surgery (unless otherwise specified) - Please call to set up an appointment, if you do not already have one  - Let our office if there are any issues with scheduling your therapy  - - A PT referral was sent to [facility] Outpatient therapy in [place]  REGIONAL ANESTHESIA (NERVE BLOCKS) The anesthesia team may have performed a nerve block for you this is a great tool used to minimize pain.   The block may start wearing off overnight (between 8-24 hours postop) When the block wears off, your pain may go from nearly zero to the pain you would have had postop without the block. This is an abrupt transition but nothing dangerous is happening.   This can be a challenging period but utilize your as needed pain medications to try and manage this period. We suggest you use the pain medication the first night prior to going to bed, to ease this transition.  You may take an extra dose of narcotic when this happens if needed   POST-OP MEDICATIONS- Multimodal approach to pain control In general your pain will be controlled with a combination of substances.  Prescriptions unless otherwise discussed are electronically sent to your pharmacy.  This is a carefully made plan we use to minimize narcotic use.     Celebrex - Anti-inflammatory medication taken on  a scheduled basis Acetaminophen - Non-narcotic pain medicine taken on a scheduled basis  Tramadol - This is a strong narcotic, to be used only on an "as needed" basis for SEVERE pain. This is for severe breakthrough pain not controlled by daily pain prescription Aspirin 81mg  - This medicine is used to minimize the risk of blood clots after surgery. Omeprazole - daily medicine to protect your stomach while taking anti-inflammatories.  Phenergan -  take as needed for nausea   FOLLOW-UP If you develop a Fever (>101.5), Redness or Drainage from the surgical incision site, please call our office to  arrange for an evaluation. Please call the office to schedule a follow-up appointment for a wound check, 7-10 days post-operatively.  IF YOU HAVE ANY QUESTIONS, PLEASE FEEL FREE TO CALL OUR OFFICE.  HELPFUL INFORMATION  Your arm will be in a sling following surgery. You will be in this sling for the next 4 weeks.   You may be more comfortable sleeping in a semi-seated position the first few nights following surgery.  Keep a pillow propped under the elbow and forearm for comfort.  If you have a recliner type of chair it might be beneficial.  If not that is fine too, but it would be helpful to sleep propped up with pillows behind your operated shoulder as well under your elbow and forearm.  This will reduce pulling on the suture lines.  When dressing, put your operative arm in the sleeve first.  When getting undressed, take your operative arm out last.  Loose fitting, button-down shirts are recommended.  In most states it is against the law to drive while your arm is in a sling. And certainly against the law to drive while taking narcotics.  You may return to work/school in the next couple of days when you feel up to it. Desk work and typing in the sling is fine.  We suggest you use the pain medication the first night prior to going to bed, in order to ease any pain when the anesthesia wears off. You should avoid taking pain medications on an empty stomach as it will make you nauseous.  You should wean off your narcotic medicines as soon as you are able.     Most patients will be off or using minimal narcotics before their first postop appointment.   Do not drink alcoholic beverages or take illicit drugs when taking pain medications.  Pain medication may make you constipated.  Below are a few solutions to try in this order: Decrease the amount of pain medication if you aren't having pain. Drink lots of decaffeinated fluids. Drink prune juice and/or each dried prunes  If the first 3 don't  work start with additional solutions Take Colace - an over-the-counter stool softener Take Senokot - an over-the-counter laxative Take Miralax - a stronger over-the-counter laxative   Dental Antibiotics:  In most cases prophylactic antibiotics for Dental procdeures after total joint surgery are not necessary.  Exceptions are as follows:  1. History of prior total joint infection  2. Severely immunocompromised (Organ Transplant, cancer chemotherapy, Rheumatoid biologic meds such as Humera)  3. Poorly controlled diabetes (A1C &gt; 8.0, blood glucose over 200)  If you have one of these conditions, contact your surgeon for an antibiotic prescription, prior to your dental procedure.   Post Anesthesia Home Care Instructions  Activity: Get plenty of rest for the remainder of the day. A responsible individual must stay with you for 24 hours following the procedure.  For the next 24 hours, DO NOT: -Drive a car -Advertising copywriter -Drink alcoholic beverages -Take any medication unless instructed by your physician -Make any legal decisions or sign important papers.  Meals: Start with liquid foods such as gelatin or soup. Progress to regular foods as tolerated. Avoid greasy, spicy, heavy foods. If nausea and/or vomiting occur, drink only clear liquids until the nausea and/or vomiting subsides. Call your physician if vomiting continues.  Special Instructions/Symptoms: Your throat may feel dry or sore from the anesthesia or the breathing tube placed in your throat during surgery. If this causes discomfort, gargle with warm salt water. The discomfort should disappear within 24 hours.  If you had a scopolamine patch placed behind your ear for the management of post- operative nausea and/or vomiting:  1. The medication in the patch is effective for 72 hours, after which it should be removed.  Wrap patch in a tissue and discard in the trash. Wash hands thoroughly with soap and water. 2. You  may remove the patch earlier than 72 hours if you experience unpleasant side effects which may include dry mouth, dizziness or visual disturbances. 3. Avoid touching the patch. Wash your hands with soap and water after contact with the patch.  Tylenol was last given at 8:17 am. Do not take for 6 hours after that time.       For more information including helpful videos and documents visit our website:   [email or web address]   DeRoyal Abductor sling instructions and diagram: (Blue ball)  Youtube video: https://youtu.be/[email or web address]  Please contact your surgeon's office if you have any questions about this shoulder immobilizer.

## 2022-08-17 NOTE — Progress Notes (Signed)
Assisted Dr. Moser with left, interscalene , ultrasound guided block. Side rails up, monitors on throughout procedure. See vital signs in flow sheet. Tolerated Procedure well. 

## 2022-08-17 NOTE — Anesthesia Procedure Notes (Signed)
Procedure Name: Intubation Date/Time: 08/17/2022 9:53 AM  Performed by: Ezequiel Kayser, CRNAPre-anesthesia Checklist: Patient identified, Emergency Drugs available, Suction available and Patient being monitored Patient Re-evaluated:Patient Re-evaluated prior to induction Oxygen Delivery Method: Circle System Utilized Preoxygenation: Pre-oxygenation with 100% oxygen Induction Type: IV induction Ventilation: Mask ventilation without difficulty Laryngoscope Size: Mac and 3 Grade View: Grade I Tube type: Oral Tube size: 7.0 mm Number of attempts: 1 Airway Equipment and Method: Stylet and Oral airway Placement Confirmation: ETT inserted through vocal cords under direct vision, positive ETCO2 and breath sounds checked- equal and bilateral Secured at: 21 cm Tube secured with: Tape Dental Injury: Teeth and Oropharynx as per pre-operative assessment

## 2022-08-17 NOTE — Interval H&P Note (Signed)
All questions answered, patient wants to proceed with procedure. ? ?

## 2022-08-17 NOTE — Op Note (Signed)
Orthopaedic Surgery Operative Note (CSN: [id number])  Patricia Carrillo  [birth year] Date of Surgery: 08/17/2022   Diagnoses:  Left shoulder recurrent cuff tear  Procedure: Left reverse lateralized total Shoulder Arthroplasty   Operative Finding Successful completion of planned procedure.  Patient had a recurrent superior cuff tear in the a type II type region just proximal to our sutures.  Her tug test was normal.  The patient's anatomy made her surgery quite difficult as her humeral head and glenoid were extraordinarily small.  I fixation was moderate in the bone and the vault was perforated in the anterior section.  We thus used an ingrowth post.  Patient is at higher than normal risk of dislocation secondary to her anatomy as well as her body habitus.  Post-operative plan: The patient will be NWB in sling.  The patient will be will be discharged from PACU if continues to be stable as was plan prior to surgery.  DVT prophylaxis Aspirin 81 mg twice daily for 6 weeks.  Pain control with PRN pain medication preferring oral medicines.  Follow up plan will be scheduled in approximately 7 days for incision check and XR.  Physical therapy to start per standard protocol.  Implants: Tornier size 1 humeral stem, performed, 0 polyethylene, 36 standard glenosphere and a 25+3 baseplate with a short ingrowth post and 2 peripheral screws  Post-Op Diagnosis: Same Surgeons:Primary: Bjorn Pippin, MD Assistants:Caroline McBane PA-C Location: [facility] OR ROOM 6 Anesthesia: General with Exparel Interscalene Antibiotics: Ancef 2g preop, Vancomycin 1000mg  locally Tourniquet time: None Estimated Blood Loss: 100 Complications: None Specimens: None Implants: Implant Name Type Inv. Item Serial No. Manufacturer Lot No. LRB No. Used Action  SCREW BONE INTRNL SM 7 - Screw SCREW BONE INTRNL SM 7 [id number] [employer]  Left 1 Implanted  SCREW PERIPHERAL 5.0X34 - [id number] Screw SCREW PERIPHERAL 5.0X34  TOR[employer]  INC ON STERILE TRAY Left 1 Implanted  SCREW 5.0X38 SMALL F/PERFORM - [id number] Screw SCREW 5.0X38 SMALL F/PERFORM  [employer] ON STERILE TRAY Left 1 Implanted  GLENOSPHERE REV SHOULDER 36 - [id number] Joint GLENOSPHERE REV SHOULDER 36 [id number] [employer]  Left 1 Implanted  BASEPLATE GLENOID RSA 3X25 0D - [id number] Shoulder BASEPLATE GLENOID RSA 3X25 0D [id number] [employer]  Left 1 Implanted  INSERT REVERSED HUMERAL SIZE 1 - [id number] Orthopedic Implant INSERT REVERSED HUMERAL SIZE 1 [id number] [employer]  Left 1 Implanted  STEM HUMERAL SHORT STD SZ1 - [id number] Orthopedic Implant STEM HUMERAL SHORT STD SZ1 [id number] [employer]  Left 1 Implanted    Indications for Surgery:   Patricia Carrillo is a 56 y.o. female with recurrent cuff tear.  Benefits and risks of operative and nonoperative management were discussed prior to surgery with patient/guardian(s) and informed consent form was completed.  Infection and need for further surgery were discussed as was prosthetic stability and cuff issues.  We additionally specifically discussed risks of axillary nerve injury, infection, periprosthetic fracture, continued pain and longevity of implants prior to beginning procedure.      Procedure:   The patient was identified in the preoperative holding area where the surgical site was marked. Block placed by anesthesia with exparel.  The patient was taken to the OR where a procedural timeout was called and the above noted anesthesia was induced.  The patient was positioned beachchair on allen table with spider arm positioner.  Preoperative antibiotics were dosed.  The patient's left shoulder was prepped and draped in the usual sterile fashion.  A second preoperative timeout was  called.       Standard deltopectoral approach was performed with a #10 blade. We dissected down to the subcutaneous tissues and the cephalic vein was taken laterally with the deltoid. Clavipectoral fascia was incised in line with the  incision. Deep retractors were placed. The long of the biceps tendon was identified and there was significant tenosynovitis present.  Tenodesis was performed to the pectoralis tendon with #2 Ethibond. The remaining biceps was followed up into the rotator interval where it was released.   The subscapularis was taken down in a full thickness layer with capsule along the humeral neck extending inferiorly around the humeral head. We continued releasing the capsule directly off of the osteophytes inferiorly all the way around the corner. This allowed [place] to dislocate the humeral head.   The rotator cuff was carefully examined and noted to be irreperably torn.  The decision was confirmed that a reverse total shoulder was indicated for this patient.  There were osteophytes along the inferior humeral neck. The osteophytes were removed with an osteotome and a rongeur.  Osteophytes were removed with a rongeur and an osteotome and the anatomic neck was well visualized.     A humeral cutting guide was used extra medullary with a pin to help control version. The version was set at 20 of retroversion. Humeral osteotomy was performed with an oscillating saw. The head fragment was passed off the back table.  A cut protector plate was placed.  The subscapularis was again identified and immediately we took care to palpate the axillary nerve anteriorly and verify its position with gentle palpation as well as the tug test.  We then released the SGHL with bovie cautery prior to placing a curved mayo at the junction of the anterior glenoid well above the axillary nerve and bluntly dissecting the subscapularis from the capsule.  We then carefully protected the axillary nerve as we gently released the inferior capsule to fully mobilize the subscapularis.  An anterior deltoid retractor was then placed as well as a small Hohmann retractor superiorly.  The glenoid was relatively intact in the setting of an irreparable cuff  tear  The remaining labrum was removed circumferentially taking great care not to disrupt the posterior capsule.   The glenoid drill guide was placed and used to drill a guide pin in the center, inferior position. The glenoid face was then reamed concentrically over the guide wire. The center hole was drilled over the guidepin in a near anatomic angle of version.  We attempted to drill a center screw however the patient's small bowel meant that there was perforation anteriorly.  We felt that a screw was not appropriate.  Went to a short ingrowth post.  We inserted it typically.  We next placed superior and inferior locking screws for additional fixation.  25+3 baseplate was used.  Next a 36 mm glenosphere was selected and impacted onto the baseplate. The center screw was tightened.  We turned attention back to the humeral side. The cut protector was removed.  We used the perform humeral sizing block to select the appropriate size which for this patient was a 1.  We then placed our center pin and reamed over it concentrically obtaining appropriate inset.  We then used our lateralizing chisel to prepare the lateral aspect of the humerus.  At that point we selected the appropriate implant trialing a 1.  Using this trial implant we trialed multiple polyethylene sizes settling on a 0 which provided good stability and range  of motion without excess soft tissue tension. The offset was dialed in to match the normal anatomy. The shoulder was trialed.  There was good ROM in all planes and the shoulder was stable with no inferior translation.  The real humeral implants were opened after again confirming sizes.  The trial was removed. #5 Fiberwire x4 sutures passed through the humeral neck for subscap repair. The humeral component was press-fit obtaining a secure fit. The joint was reduced and thoroughly irrigated with pulsatile lavage. Subscap was repaired back with #5 Fiberwire sutures through bone tunnels. Hemostasis  was obtained. The deltopectoral interval was reapproximated with #1 Ethibond. The subcutaneous tissues were closed with 2-0 Vicryl and the skin was closed with running monocryl.    The wounds were cleaned and dried and an Aquacel dressing was placed. The drapes taken down. The arm was placed into sling with abduction pillow. Patient was awakened, extubated, and transferred to the recovery room in stable condition. There were no intraoperative complications. The sponge, needle, and attention counts were  correct at the end of the case.     Alfonse Alpers, PA-C, present and scrubbed throughout the case, critical for completion in a timely fashion, and for retraction, instrumentation, closure.

## 2022-08-17 NOTE — Anesthesia Procedure Notes (Signed)
Anesthesia Regional Block: Interscalene brachial plexus block   Pre-Anesthetic Checklist: , timeout performed,  Correct Patient, Correct Site, Correct Laterality,  Correct Procedure, Correct Position, site marked,  Risks and benefits discussed,  Surgical consent,  Pre-op evaluation,  At surgeon's request and post-op pain management  Laterality: Left and Upper  Prep: chloraprep       Needles:  Injection technique: Single-shot      Needle Length: 5cm  Needle Gauge: 22     Additional Needles: Arrow StimuQuik ECHO Echogenic Stimulating PNB Needle  Procedures:,,,, ultrasound used (permanent image in chart),,    Narrative:  Start time: 08/17/2022 9:29 AM End time: 08/17/2022 9:35 AM Injection made incrementally with aspirations every 5 mL.  Performed by: Personally  Anesthesiologist: Val Eagle, MD

## 2022-08-18 ENCOUNTER — Encounter (HOSPITAL_BASED_OUTPATIENT_CLINIC_OR_DEPARTMENT_OTHER): Payer: Self-pay | Admitting: Orthopaedic Surgery

## 2022-08-18 NOTE — Anesthesia Postprocedure Evaluation (Signed)
Anesthesia Post Note  Patient: Patricia Carrillo  Procedure(s) Performed: REVERSE SHOULDER ARTHROPLASTY (Left: Shoulder)     Patient location during evaluation: PACU Anesthesia Type: General and Regional Level of consciousness: awake and alert Pain management: pain level controlled Vital Signs Assessment: post-procedure vital signs reviewed and stable Respiratory status: spontaneous breathing, nonlabored ventilation and respiratory function stable Cardiovascular status: blood pressure returned to baseline and stable Postop Assessment: no apparent nausea or vomiting Anesthetic complications: no   No notable events documented.  Last Vitals:  Vitals:   08/17/22 1145 08/17/22 1247  BP: 132/66 136/70  Pulse: 86 82  Resp: 16 18  Temp: 36.9 C 36.8 C  SpO2: 98% 95%    Last Pain:  Vitals:   08/17/22 1247  TempSrc: Oral  PainSc: 0-No pain                 Genoa Freyre

## 2022-09-11 ENCOUNTER — Encounter: Payer: Self-pay | Admitting: Physical Therapy

## 2022-09-11 ENCOUNTER — Ambulatory Visit: Payer: Medicaid Other | Attending: Orthopaedic Surgery | Admitting: Physical Therapy

## 2022-09-11 ENCOUNTER — Other Ambulatory Visit: Payer: Self-pay

## 2022-09-11 DIAGNOSIS — G8929 Other chronic pain: Secondary | ICD-10-CM | POA: Diagnosis present

## 2022-09-11 DIAGNOSIS — M25512 Pain in left shoulder: Secondary | ICD-10-CM | POA: Insufficient documentation

## 2022-09-11 DIAGNOSIS — M25612 Stiffness of left shoulder, not elsewhere classified: Secondary | ICD-10-CM | POA: Diagnosis present

## 2022-09-11 NOTE — Therapy (Signed)
OUTPATIENT PHYSICAL THERAPY SHOULDER EVALUATION   Patient Name: Patricia Carrillo MRN: [id number] DOB:[birth year], 56 y.o., female Today's Date: 09/11/2022  END OF SESSION:  PT End of Session - 09/11/22 1236     Visit Number 1    Number of Visits 12    Date for PT Re-Evaluation 10/23/22    PT Start Time 1117    PT Stop Time 1142    PT Time Calculation (min) 25 min    Activity Tolerance Patient tolerated treatment well    Behavior During Therapy [facility] for tasks assessed/performed             Past Medical History:  Diagnosis Date   Anxiety    Chronic kidney disease    "stage 3" per pt   Fatigue    Headache    Hypertension    Obesity 01/16/2022   takes metformin and ozempic "for weight loss, not diabetic"   Sleep apnea    dx 15 years ago, never used CPAP   Past Surgical History:  Procedure Laterality Date   CHOLECYSTECTOMY     LAPAROSCOPIC HYSTERECTOMY     REVERSE SHOULDER ARTHROPLASTY Left 08/17/2022   Procedure: REVERSE SHOULDER ARTHROPLASTY;  Surgeon: Hiram Gash, MD;  Location: [place];  Service: Orthopedics;  Laterality: Left;   SHOULDER ARTHROSCOPY WITH DISTAL CLAVICLE RESECTION Left 01/26/2022   Procedure: SHOULDER ARTHROSCOPY WITH DISTAL CLAVICLE EXCISION/ DEBRIDEMENT;  Surgeon: Hiram Gash, MD;  Location: [place];  Service: Orthopedics;  Laterality: Left;   SHOULDER ARTHROSCOPY WITH SUBACROMIAL DECOMPRESSION, ROTATOR CUFF REPAIR AND BICEP TENDON REPAIR Left 01/26/2022   Procedure: SHOULDER ARTHROSCOPY WITH SUBACROMIAL DECOMPRESSION, ROTATOR CUFF REPAIR;  Surgeon: Hiram Gash, MD;  Location: [place];  Service: Orthopedics;  Laterality: Left;   TONSILLECTOMY     TUBAL LIGATION     Patient Active Problem List   Diagnosis Date Noted   Lumbar degenerative disc disease 11/30/2021   Impingement syndrome, shoulder, left 03/14/2021   Primary osteoarthritis of both knees 03/14/2021    REFERRING PROVIDER: Ophelia Charter MD  REFERRING DIAG: Left total shoulder replacement.  THERAPY DIAG:  Chronic left shoulder pain  Stiffness of left shoulder, not elsewhere classified  Rationale for Evaluation and Treatment: Rehabilitation  ONSET DATE: 08/17/22 (surgery date).  SUBJECTIVE:                                                                                                                                                                                      SUBJECTIVE STATEMENT: The patient presents to the clinic s/p left total shoulder replacement performed on 08/17/22.  She is in her sling at  this time.  Her pain is rated at an 8/10 at this time.  She describes the pain as an ache, sore, sharp and throbbing.  Sleeping is disturbed due to pain.    PERTINENT HISTORY: Left RCR, HTN.  PAIN:  Are you having pain? Yes: NPRS scale: 8/10 Pain location: Left shoulder. Pain description: As above. Aggravating factors: Sleeping. Relieving factors: Rest in sling.  PRECAUTIONS: Fall.  Please be with patient at all times.  WEIGHT BEARING RESTRICTIONS:  No left UE weight bearing.  FALLS:  Has patient fallen in last 6 months? Yes. Number of falls 3.  Patient states her knees have given way. MD's looking into cause.  Recommend cane on non-affected right side for additional safety.  PLOF: Independent  PATIENT GOALS:Use left UE without pain.  NEXT MD VISIT:   OBJECTIVE:    UPPER EXTREMITY ROM:  In supine:  Gentle left shoulder flexion to 70 degrees and ER to 15 degrees.  PALPATION:  Tender to palpation over near left shoulder incisional site which appears to be healing very well.    ASSESSMENT:  CLINICAL IMPRESSION: The patient present to OPPT s/p left total shoulder replacement performed on 08/17/22.  She is compliant to her sling usage.  Her incisional appears to be healing well.  We discussed progression and that we would follow protocol per Dr. Everardo Pacific.  She has some expected tenderness over and  near her left shoulder incisional site.  Range of motion within protocol guidelines today.  Patient will benefit from skilled physical therapy intervention to address pain and deficits.  OBJECTIVE IMPAIRMENTS: decreased activity tolerance, decreased ROM, and pain.   ACTIVITY LIMITATIONS: carrying, lifting, sleeping, and reach over head  PARTICIPATION LIMITATIONS: meal prep, cleaning, and laundry  PERSONAL FACTORS: 1 comorbidity: previous left shoulder surgery  are also affecting patient's functional outcome.   REHAB POTENTIAL: Excellent  CLINICAL DECISION MAKING: Stable/uncomplicated  EVALUATION COMPLEXITY: Low   GOALS:   LONG TERM GOALS: Target date: 6 weeks.  Ind with HEP. Baseline:  Goal status: INITIAL  2.  Active left shoulder flexion to 145 degrees so the patient can easily reach overhead. Baseline:  Goal status: INITIAL  3.  Active ER to 60 degrees+ to allow for easily donning/doffing of apparel. Baseline:  Goal status: INITIAL  4.  Increase left shoulder strength to a solid 4/5 to increase stability for performance of functional activities. Baseline:  Goal status: INITIAL  5.  Perform ADL's with pain not > 3/10. Baseline:  Goal status: INITIAL   PLAN:  PT FREQUENCY: 2x/week  PT DURATION: 6 weeks  PLANNED INTERVENTIONS: Therapeutic exercises, Therapeutic activity, Neuromuscular re-education, Patient/Family education, Self Care, and Manual therapy  PLAN FOR NEXT SESSION: progress per protocol.  Begin with gentle PROM to patient's left shoulder.   Jefrey Raburn, [place], PT 09/11/2022, 12:37 PM

## 2022-09-18 ENCOUNTER — Ambulatory Visit: Payer: Medicaid Other | Admitting: Sports Medicine

## 2022-09-18 DIAGNOSIS — M170 Bilateral primary osteoarthritis of knee: Secondary | ICD-10-CM | POA: Diagnosis not present

## 2022-09-18 MED ORDER — GABAPENTIN 800 MG PO TABS: 800.0000 mg | ORAL_TABLET | Freq: Two times a day (BID) | ORAL | 3 refills | Status: DC

## 2022-09-18 MED ORDER — QUAD CANE MISC: 0 refills | Status: AC

## 2022-09-18 NOTE — Assessment & Plan Note (Signed)
Unfortunately has not responded to multiple injections, viscosupplementation in the past, therapy, adding a 4 post knee brace, she will touch base with Dr. Dion Saucier for discussion of arthroplasty. Increasing gabapentin. Return as needed.

## 2022-09-18 NOTE — Progress Notes (Signed)
    Procedures performed today:    None.  Independent interpretation of notes and tests performed by another provider:   None.  Brief History, Exam, Impression, and Recommendations:    Primary osteoarthritis of both knees Unfortunately has not responded to multiple injections, viscosupplementation in the past, therapy, adding a 4 post knee brace, she will touch base with Dr. Dion Saucier for discussion of arthroplasty. Increasing gabapentin. Return as needed.  Chronic process not at goal with pharmacologic intervention  ____________________________________________ Patricia Carrillo. Benjamin Stain, M.D., ABFM., CAQSM., AME. Primary Care and Sports Medicine [facility]  Adjunct Professor of Family Medicine  [employer]  Restaurant manager, fast food

## 2022-09-19 ENCOUNTER — Ambulatory Visit: Payer: Medicaid Other | Admitting: *Deleted

## 2022-09-19 DIAGNOSIS — M25512 Pain in left shoulder: Secondary | ICD-10-CM

## 2022-09-19 DIAGNOSIS — G8929 Other chronic pain: Secondary | ICD-10-CM

## 2022-09-19 DIAGNOSIS — M25612 Stiffness of left shoulder, not elsewhere classified: Secondary | ICD-10-CM

## 2022-09-19 NOTE — Therapy (Signed)
OUTPATIENT PHYSICAL THERAPY SHOULDER TREATMENT   Patient Name: Patricia Carrillo MRN: [id number] DOB:Mar 04, 1966, 56 y.o., female Today's Date: 09/19/2022  END OF SESSION:  PT End of Session - 09/19/22 1303     Visit Number 2    Number of Visits 12    Date for PT Re-Evaluation 10/23/22    PT Start Time 1115    PT Stop Time 1200    PT Time Calculation (min) 45 min             Past Medical History:  Diagnosis Date   Anxiety    Chronic kidney disease    "stage 3" per pt   Fatigue    Headache    Hypertension    Obesity 01/16/2022   takes metformin and ozempic "for weight loss, not diabetic"   Sleep apnea    dx 15 years ago, never used CPAP   Past Surgical History:  Procedure Laterality Date   CHOLECYSTECTOMY     LAPAROSCOPIC HYSTERECTOMY     REVERSE SHOULDER ARTHROPLASTY Left 08/17/2022   Procedure: REVERSE SHOULDER ARTHROPLASTY;  Surgeon: Hiram Gash, MD;  Location: [place];  Service: Orthopedics;  Laterality: Left;   SHOULDER ARTHROSCOPY WITH DISTAL CLAVICLE RESECTION Left 01/26/2022   Procedure: SHOULDER ARTHROSCOPY WITH DISTAL CLAVICLE EXCISION/ DEBRIDEMENT;  Surgeon: Hiram Gash, MD;  Location: [place];  Service: Orthopedics;  Laterality: Left;   SHOULDER ARTHROSCOPY WITH SUBACROMIAL DECOMPRESSION, ROTATOR CUFF REPAIR AND BICEP TENDON REPAIR Left 01/26/2022   Procedure: SHOULDER ARTHROSCOPY WITH SUBACROMIAL DECOMPRESSION, ROTATOR CUFF REPAIR;  Surgeon: Hiram Gash, MD;  Location: [place];  Service: Orthopedics;  Laterality: Left;   TONSILLECTOMY     TUBAL LIGATION     Patient Active Problem List   Diagnosis Date Noted   Lumbar degenerative disc disease 11/30/2021   Impingement syndrome, shoulder, left 03/14/2021   Primary osteoarthritis of both knees 03/14/2021    REFERRING PROVIDER: Ophelia Charter MD  REFERRING DIAG: Left total shoulder replacement.  THERAPY DIAG:  Chronic left shoulder pain  Stiffness  of left shoulder, not elsewhere classified  Acute pain of left shoulder  Rationale for Evaluation and Treatment: Rehabilitation  ONSET DATE: 08/17/22 (surgery date).  SUBJECTIVE:                                                                                                                                                                                      SUBJECTIVE STATEMENT: The patient presents to the clinic s/p left total shoulder replacement performed on 08/17/22. She reports mainly soreness LT shldr. Reports Dr. Michela Pitcher she could use LT shldr ,but don't pick up more than  20 #s    PERTINENT HISTORY: Left RCR, HTN.  PAIN:  Are you having pain? Yes: NPRS scale: 6/10 Pain location: Left shoulder. Pain description: As above. Aggravating factors: Sleeping. Relieving factors: Rest in sling.  PRECAUTIONS: Fall.  Please be with patient at all times.  WEIGHT BEARING RESTRICTIONS:  No left UE weight bearing.  FALLS:  Has patient fallen in last 6 months? Yes. Number of falls 3.  Patient states her knees have given way. MD's looking into cause.  Recommend cane on non-affected right side for additional safety.  PLOF: Independent  PATIENT GOALS:Use left UE without pain.  NEXT MD VISIT:   OBJECTIVE:     09-19-22  Manual:   PROM P/AROM to LT shldr for elevation and ER f/b light deltoid isometrics and rhythmic stab for elevation and ER/IR in supine. Elevation to 100 degrees and ER to 50 degrees today  Therex: Pt instructed in supine PROM with RT UE for elevation and standing Deltoid isometrics for flexion and ABD x 10 hold 5 secs   ASSESSMENT:  CLINICAL IMPRESSION: The patient presents to OPPT s/p left total shoulder replacement performed on 08/17/22. Pt instructed in deltoid isometrics for HEP as well as PROM for elevation. Manual PROM and mm activation techniques performed for elevation and IR/ER. PROM for elevation 100 degrees and ER to 50 degrees with soreness only, and no  pain.     OBJECTIVE IMPAIRMENTS: decreased activity tolerance, decreased ROM, and pain.   ACTIVITY LIMITATIONS: carrying, lifting, sleeping, and reach over head  PARTICIPATION LIMITATIONS: meal prep, cleaning, and laundry  PERSONAL FACTORS: 1 comorbidity: previous left shoulder surgery  are also affecting patient's functional outcome.   REHAB POTENTIAL: Excellent  CLINICAL DECISION MAKING: Stable/uncomplicated  EVALUATION COMPLEXITY: Low   GOALS:   LONG TERM GOALS: Target date: 6 weeks.  Ind with HEP. Baseline:  Goal status: INITIAL  2.  Active left shoulder flexion to 145 degrees so the patient can easily reach overhead. Baseline:  Goal status: INITIAL  3.  Active ER to 60 degrees+ to allow for easily donning/doffing of apparel. Baseline:  Goal status: INITIAL  4.  Increase left shoulder strength to a solid 4/5 to increase stability for performance of functional activities. Baseline:  Goal status: INITIAL  5.  Perform ADL's with pain not > 3/10. Baseline:  Goal status: INITIAL   PLAN:  PT FREQUENCY: 2x/week  PT DURATION: 6 weeks  PLANNED INTERVENTIONS: Therapeutic exercises, Therapeutic activity, Neuromuscular re-education, Patient/Family education, Self Care, and Manual therapy  PLAN FOR NEXT SESSION: progress per protocol.  Begin with gentle PROM to patient's left shoulder. No Vaso   Nehemias Sauceda,CHRIS, PTA 09/19/2022, 1:58 PM

## 2022-09-20 ENCOUNTER — Ambulatory Visit: Payer: Medicaid Other

## 2022-09-20 DIAGNOSIS — M25612 Stiffness of left shoulder, not elsewhere classified: Secondary | ICD-10-CM

## 2022-09-20 DIAGNOSIS — M25512 Pain in left shoulder: Secondary | ICD-10-CM

## 2022-09-20 DIAGNOSIS — G8929 Other chronic pain: Secondary | ICD-10-CM

## 2022-09-20 NOTE — Therapy (Signed)
OUTPATIENT PHYSICAL THERAPY SHOULDER TREATMENT   Patient Name: Patricia Carrillo MRN: [id number] DOB:1966-02-11, 56 y.o., female Today's Date: 09/20/2022  END OF SESSION:  PT End of Session - 09/20/22 0820     Visit Number 3    Number of Visits 12    Date for PT Re-Evaluation 10/23/22    PT Start Time 0815             Past Medical History:  Diagnosis Date   Anxiety    Chronic kidney disease    "stage 3" per pt   Fatigue    Headache    Hypertension    Obesity 01/16/2022   takes metformin and ozempic "for weight loss, not diabetic"   Sleep apnea    dx 15 years ago, never used CPAP   Past Surgical History:  Procedure Laterality Date   CHOLECYSTECTOMY     LAPAROSCOPIC HYSTERECTOMY     REVERSE SHOULDER ARTHROPLASTY Left 08/17/2022   Procedure: REVERSE SHOULDER ARTHROPLASTY;  Surgeon: Bjorn Pippin, MD;  Location: [facility];  Service: Orthopedics;  Laterality: Left;   SHOULDER ARTHROSCOPY WITH DISTAL CLAVICLE RESECTION Left 01/26/2022   Procedure: SHOULDER ARTHROSCOPY WITH DISTAL CLAVICLE EXCISION/ DEBRIDEMENT;  Surgeon: Bjorn Pippin, MD;  Location: [facility];  Service: Orthopedics;  Laterality: Left;   SHOULDER ARTHROSCOPY WITH SUBACROMIAL DECOMPRESSION, ROTATOR CUFF REPAIR AND BICEP TENDON REPAIR Left 01/26/2022   Procedure: SHOULDER ARTHROSCOPY WITH SUBACROMIAL DECOMPRESSION, ROTATOR CUFF REPAIR;  Surgeon: Bjorn Pippin, MD;  Location: [facility];  Service: Orthopedics;  Laterality: Left;   TONSILLECTOMY     TUBAL LIGATION     Patient Active Problem List   Diagnosis Date Noted   Lumbar degenerative disc disease 11/30/2021   Impingement syndrome, shoulder, left 03/14/2021   Primary osteoarthritis of both knees 03/14/2021    REFERRING PROVIDER: Ramond Marrow MD  REFERRING DIAG: Left total shoulder replacement.  THERAPY DIAG:  Chronic left shoulder pain  Stiffness of left shoulder, not elsewhere classified  Acute pain of  left shoulder  Rationale for Evaluation and Treatment: Rehabilitation  ONSET DATE: 08/17/22 (surgery date).  SUBJECTIVE:                                                                                                                                                                                      SUBJECTIVE STATEMENT: Pt reports 3/10 left shoulder pain.  PERTINENT HISTORY: Left RCR, HTN.  PAIN:  Are you having pain? Yes: NPRS scale: 3/10 Pain location: Left shoulder. Pain description: As above. Aggravating factors: Sleeping. Relieving factors: Rest in sling.  PRECAUTIONS: Fall.  Please be with patient at all times.  WEIGHT BEARING RESTRICTIONS:  No left UE weight bearing.  FALLS:  Has patient fallen in last 6 months? Yes. Number of falls 3.  Patient states her knees have given way. MD's looking into cause.  Recommend cane on non-affected right side for additional safety.  PLOF: Independent  PATIENT GOALS:Use left UE without pain.  NEXT MD VISIT:   OBJECTIVE:     09-20-22                                     EXERCISE LOG  Exercise Repetitions and Resistance Comments  Chest press  AAROM x 20 reps   Flexion AAROM x 20 reps   ER AAROM x 20 reps x 5 sec hold   Isometric Flexion X10 reps x 5 sec hold   Isometric Abduction X10 reps x 5 sec hold    Blank cell = exercise not performed today   Manual:   PROM P/AROM to LT shldr for elevation and ER f/b light deltoid isometrics and rhythmic stab for elevation and ER/IR in supine.   ASSESSMENT:  CLINICAL IMPRESSION: Pt arrives for today's treatment session reporting 3/10 left shoulder pain.  Pt reports feeling good after last session with slight increase in soreness.  Pt performed AAROM with cane today with cues to remain within pain free ROM.  PROM performed into flexion and ER and gentle hold at end range.  Pt encouraged to decrease guarding of left shoulder and elbow with ROM performed at elbow for function.  Pt denied any  change in pain at completion of today's treatment session.   OBJECTIVE IMPAIRMENTS: decreased activity tolerance, decreased ROM, and pain.   ACTIVITY LIMITATIONS: carrying, lifting, sleeping, and reach over head  PARTICIPATION LIMITATIONS: meal prep, cleaning, and laundry  PERSONAL FACTORS: 1 comorbidity: previous left shoulder surgery  are also affecting patient's functional outcome.   REHAB POTENTIAL: Excellent  CLINICAL DECISION MAKING: Stable/uncomplicated  EVALUATION COMPLEXITY: Low   GOALS:   LONG TERM GOALS: Target date: 6 weeks.  Ind with HEP. Baseline:  Goal status: INITIAL  2.  Active left shoulder flexion to 145 degrees so the patient can easily reach overhead. Baseline:  Goal status: INITIAL  3.  Active ER to 60 degrees+ to allow for easily donning/doffing of apparel. Baseline:  Goal status: INITIAL  4.  Increase left shoulder strength to a solid 4/5 to increase stability for performance of functional activities. Baseline:  Goal status: INITIAL  5.  Perform ADL's with pain not > 3/10. Baseline:  Goal status: INITIAL   PLAN:  PT FREQUENCY: 2x/week  PT DURATION: 6 weeks  PLANNED INTERVENTIONS: Therapeutic exercises, Therapeutic activity, Neuromuscular re-education, Patient/Family education, Self Care, and Manual therapy  PLAN FOR NEXT SESSION: progress per protocol.  Begin with gentle PROM to patient's left shoulder. No Vaso   Newman Pies, PTA 09/20/2022, 8:20 AM

## 2022-09-25 ENCOUNTER — Ambulatory Visit: Payer: Medicaid Other | Admitting: Physical Therapy

## 2022-09-25 ENCOUNTER — Encounter: Payer: Self-pay | Admitting: Physical Therapy

## 2022-09-25 DIAGNOSIS — M25612 Stiffness of left shoulder, not elsewhere classified: Secondary | ICD-10-CM

## 2022-09-25 DIAGNOSIS — G8929 Other chronic pain: Secondary | ICD-10-CM

## 2022-09-25 DIAGNOSIS — M25512 Pain in left shoulder: Secondary | ICD-10-CM | POA: Diagnosis not present

## 2022-09-25 NOTE — Therapy (Signed)
OUTPATIENT PHYSICAL THERAPY SHOULDER TREATMENT   Patient Name: Patricia Carrillo MRN: [id number] DOB:March 31, 1966, 56 y.o., female Today's Date: 09/25/2022  END OF SESSION:  PT End of Session - 09/25/22 0927     Visit Number 4    Number of Visits 12    Date for PT Re-Evaluation 10/23/22    PT Start Time 0815    PT Stop Time 0858    PT Time Calculation (min) 43 min    Activity Tolerance Patient tolerated treatment well    Behavior During Therapy [facility] for tasks assessed/performed             Past Medical History:  Diagnosis Date   Anxiety    Chronic kidney disease    "stage 3" per pt   Fatigue    Headache    Hypertension    Obesity 01/16/2022   takes metformin and ozempic "for weight loss, not diabetic"   Sleep apnea    dx 15 years ago, never used CPAP   Past Surgical History:  Procedure Laterality Date   CHOLECYSTECTOMY     LAPAROSCOPIC HYSTERECTOMY     REVERSE SHOULDER ARTHROPLASTY Left 08/17/2022   Procedure: REVERSE SHOULDER ARTHROPLASTY;  Surgeon: Bjorn Pippin, MD;  Location: [facility];  Service: Orthopedics;  Laterality: Left;   SHOULDER ARTHROSCOPY WITH DISTAL CLAVICLE RESECTION Left 01/26/2022   Procedure: SHOULDER ARTHROSCOPY WITH DISTAL CLAVICLE EXCISION/ DEBRIDEMENT;  Surgeon: Bjorn Pippin, MD;  Location: [facility];  Service: Orthopedics;  Laterality: Left;   SHOULDER ARTHROSCOPY WITH SUBACROMIAL DECOMPRESSION, ROTATOR CUFF REPAIR AND BICEP TENDON REPAIR Left 01/26/2022   Procedure: SHOULDER ARTHROSCOPY WITH SUBACROMIAL DECOMPRESSION, ROTATOR CUFF REPAIR;  Surgeon: Bjorn Pippin, MD;  Location: [facility];  Service: Orthopedics;  Laterality: Left;   TONSILLECTOMY     TUBAL LIGATION     Patient Active Problem List   Diagnosis Date Noted   Lumbar degenerative disc disease 11/30/2021   Impingement syndrome, shoulder, left 03/14/2021   Primary osteoarthritis of both knees 03/14/2021    REFERRING PROVIDER: Ramond Marrow MD  REFERRING DIAG: Left total shoulder replacement.  THERAPY DIAG:  Chronic left shoulder pain  Stiffness of left shoulder, not elsewhere classified  Rationale for Evaluation and Treatment: Rehabilitation  ONSET DATE: 08/17/22 (surgery date).  SUBJECTIVE:                                                                                                                                                                                      SUBJECTIVE STATEMENT: Pt reports 3/10 left shoulder pain.  PERTINENT HISTORY: Left RCR, HTN.  PAIN:  Are you having pain? Yes: NPRS  scale: 3/10 Pain location: Left shoulder. Pain description: As above. Aggravating factors: Sleeping. Relieving factors: Rest in sling.  PRECAUTIONS: Fall.  Please be with patient at all times.  WEIGHT BEARING RESTRICTIONS:  No left UE weight bearing.  FALLS:  Has patient fallen in last 6 months? Yes. Number of falls 3.  Patient states her knees have given way. MD's looking into cause.  Recommend cane on non-affected right side for additional safety.  PLOF: Independent  PATIENT GOALS:Use left UE without pain.  NEXT MD VISIT:   OBJECTIVE:     09-25-22                                     EXERCISE LOG  Manual:   Gentle PROM to LT shldr and  rhythmic stab per protocol x 23 minutes.  ASSESSMENT:  CLINICAL IMPRESSION: Patient making very good progress per protocol.  She does reports hypersensitivity over her scar and down left UE.  We discussed desensitization techniques such as using different textures (ie:  terry cloth) over incisional site.  OBJECTIVE IMPAIRMENTS: decreased activity tolerance, decreased ROM, and pain.   ACTIVITY LIMITATIONS: carrying, lifting, sleeping, and reach over head  PARTICIPATION LIMITATIONS: meal prep, cleaning, and laundry  PERSONAL FACTORS: 1 comorbidity: previous left shoulder surgery  are also affecting patient's functional outcome.   REHAB POTENTIAL:  Excellent  CLINICAL DECISION MAKING: Stable/uncomplicated  EVALUATION COMPLEXITY: Low   GOALS:   LONG TERM GOALS: Target date: 6 weeks.  Ind with HEP. Baseline:  Goal status: INITIAL  2.  Active left shoulder flexion to 145 degrees so the patient can easily reach overhead. Baseline:  Goal status: INITIAL  3.  Active ER to 60 degrees+ to allow for easily donning/doffing of apparel. Baseline:  Goal status: INITIAL  4.  Increase left shoulder strength to a solid 4/5 to increase stability for performance of functional activities. Baseline:  Goal status: INITIAL  5.  Perform ADL's with pain not > 3/10. Baseline:  Goal status: INITIAL   PLAN:  PT FREQUENCY: 2x/week  PT DURATION: 6 weeks  PLANNED INTERVENTIONS: Therapeutic exercises, Therapeutic activity, Neuromuscular re-education, Patient/Family education, Self Care, and Manual therapy  PLAN FOR NEXT SESSION: progress per protocol.  Begin with gentle PROM to patient's left shoulder. No Vaso   Contrell Ballentine, [place], PT 09/25/2022, 9:29 AM

## 2022-09-27 ENCOUNTER — Ambulatory Visit: Payer: Medicaid Other

## 2022-09-27 DIAGNOSIS — M25612 Stiffness of left shoulder, not elsewhere classified: Secondary | ICD-10-CM

## 2022-09-27 DIAGNOSIS — G8929 Other chronic pain: Secondary | ICD-10-CM

## 2022-09-27 DIAGNOSIS — M25512 Pain in left shoulder: Secondary | ICD-10-CM

## 2022-09-27 NOTE — Therapy (Signed)
OUTPATIENT PHYSICAL THERAPY SHOULDER TREATMENT   Patient Name: Patricia Carrillo MRN: [id number] DOB:February 04, 1966, 56 y.o., female Today's Date: 09/27/2022  END OF SESSION:  PT End of Session - 09/27/22 0819     Visit Number 5    Number of Visits 12    Date for PT Re-Evaluation 10/23/22    PT Start Time 0815    Activity Tolerance Patient tolerated treatment well    Behavior During Therapy [facility] for tasks assessed/performed             Past Medical History:  Diagnosis Date   Anxiety    Chronic kidney disease    "stage 3" per pt   Fatigue    Headache    Hypertension    Obesity 01/16/2022   takes metformin and ozempic "for weight loss, not diabetic"   Sleep apnea    dx 15 years ago, never used CPAP   Past Surgical History:  Procedure Laterality Date   CHOLECYSTECTOMY     LAPAROSCOPIC HYSTERECTOMY     REVERSE SHOULDER ARTHROPLASTY Left 08/17/2022   Procedure: REVERSE SHOULDER ARTHROPLASTY;  Surgeon: Bjorn Pippin, MD;  Location: [facility];  Service: Orthopedics;  Laterality: Left;   SHOULDER ARTHROSCOPY WITH DISTAL CLAVICLE RESECTION Left 01/26/2022   Procedure: SHOULDER ARTHROSCOPY WITH DISTAL CLAVICLE EXCISION/ DEBRIDEMENT;  Surgeon: Bjorn Pippin, MD;  Location: [facility];  Service: Orthopedics;  Laterality: Left;   SHOULDER ARTHROSCOPY WITH SUBACROMIAL DECOMPRESSION, ROTATOR CUFF REPAIR AND BICEP TENDON REPAIR Left 01/26/2022   Procedure: SHOULDER ARTHROSCOPY WITH SUBACROMIAL DECOMPRESSION, ROTATOR CUFF REPAIR;  Surgeon: Bjorn Pippin, MD;  Location: [facility];  Service: Orthopedics;  Laterality: Left;   TONSILLECTOMY     TUBAL LIGATION     Patient Active Problem List   Diagnosis Date Noted   Lumbar degenerative disc disease 11/30/2021   Impingement syndrome, shoulder, left 03/14/2021   Primary osteoarthritis of both knees 03/14/2021    REFERRING PROVIDER: Ramond Marrow MD  REFERRING DIAG: Left total shoulder  replacement.  THERAPY DIAG:  Chronic left shoulder pain  Stiffness of left shoulder, not elsewhere classified  Acute pain of left shoulder  Rationale for Evaluation and Treatment: Rehabilitation  ONSET DATE: 08/17/22 (surgery date).  SUBJECTIVE:                                                                                                                                                                                      SUBJECTIVE STATEMENT: Pt reports 4/10 left shoulder pain.  PERTINENT HISTORY: Left RCR, HTN.  PAIN:  Are you having pain? Yes: NPRS scale: 4/10 Pain location: Left shoulder. Pain description: As above.  Aggravating factors: Sleeping. Relieving factors: Rest in sling.  PRECAUTIONS: Fall.  Please be with patient at all times.  WEIGHT BEARING RESTRICTIONS:  No left UE weight bearing.  FALLS:  Has patient fallen in last 6 months? Yes. Number of falls 3.  Patient states her knees have given way. MD's looking into cause.  Recommend cane on non-affected right side for additional safety.  PLOF: Independent  PATIENT GOALS:Use left UE without pain.  NEXT MD VISIT:   OBJECTIVE:     09-27-22                                    EXERCISE LOG  Exercise Repetitions and Resistance Comments  Pulleys X5 mins flexion   Chest press  AAROM x 20 reps    Flexion AAROM x 20 reps    ER AAROM x 20 reps x 5 sec hold    Isometric Flexion X10 reps x 5 sec hold    Isometric Abduction X10 reps x 5 sec hold     Blank cell = exercise not performed today   Manual Therapy Soft Tissue Mobilization: left deltoid and bicep, STW/M to left deltoid and bicep to decrease pain and tone with pt in sitting Passive ROM: left shoulder, left shoulder into flexion and ER to increase ROM and function    ASSESSMENT:  CLINICAL IMPRESSION: Pt arrives for today's treatment session reporting 4/10 left shoulder pain. Pt able to tolerate introduction to pulleys today without issue or increase in  pain.  Pt requiring cues for hold at end range. AAROM and PROM performed into flexion and ER with gentle hold at end range to increase ROM and function.  Pt instructed to add cane exercises to HEP.  STW/M performed to left deltoid and bicep to decrease pain and tone at completion of today's treatment session.  Pt reported mild decrease in pain at completion of today's treatment session.   OBJECTIVE IMPAIRMENTS: decreased activity tolerance, decreased ROM, and pain.   ACTIVITY LIMITATIONS: carrying, lifting, sleeping, and reach over head  PARTICIPATION LIMITATIONS: meal prep, cleaning, and laundry  PERSONAL FACTORS: 1 comorbidity: previous left shoulder surgery  are also affecting patient's functional outcome.   REHAB POTENTIAL: Excellent  CLINICAL DECISION MAKING: Stable/uncomplicated  EVALUATION COMPLEXITY: Low   GOALS:   LONG TERM GOALS: Target date: 6 weeks.  Ind with HEP. Baseline:  Goal status: IN PROGRESS  2.  Active left shoulder flexion to 145 degrees so the patient can easily reach overhead. Baseline:  Goal status: IN PROGRESS  3.  Active ER to 60 degrees+ to allow for easily donning/doffing of apparel. Baseline:  Goal status: IN PROGRESS  4.  Increase left shoulder strength to a solid 4/5 to increase stability for performance of functional activities. Baseline:  Goal status: IN PROGRESS  5.  Perform ADL's with pain not > 3/10. Baseline:  Goal status: IN PROGRESS   PLAN:  PT FREQUENCY: 2x/week  PT DURATION: 6 weeks  PLANNED INTERVENTIONS: Therapeutic exercises, Therapeutic activity, Neuromuscular re-education, Patient/Family education, Self Care, and Manual therapy  PLAN FOR NEXT SESSION: progress per protocol.  Begin with gentle PROM to patient's left shoulder. No Vaso   Newman Pies, PTA 09/27/2022, 9:10 AM

## 2022-10-03 ENCOUNTER — Ambulatory Visit: Payer: Medicaid Other | Admitting: Physical Therapy

## 2022-10-03 ENCOUNTER — Encounter: Payer: Self-pay | Admitting: Physical Therapy

## 2022-10-03 DIAGNOSIS — M25512 Pain in left shoulder: Secondary | ICD-10-CM | POA: Diagnosis not present

## 2022-10-03 DIAGNOSIS — G8929 Other chronic pain: Secondary | ICD-10-CM

## 2022-10-03 DIAGNOSIS — M25612 Stiffness of left shoulder, not elsewhere classified: Secondary | ICD-10-CM

## 2022-10-03 NOTE — Therapy (Signed)
OUTPATIENT PHYSICAL THERAPY SHOULDER TREATMENT   Patient Name: Patricia Carrillo MRN: [id number] DOB:[birth year], 56 y.o., female Today's Date: 10/03/2022  END OF SESSION:  PT End of Session - 10/03/22 0905     Visit Number 6    Number of Visits 12    Date for PT Re-Evaluation 10/23/22    PT Start Time 0902    PT Stop Time 0941    PT Time Calculation (min) 39 min    Activity Tolerance Patient tolerated treatment well    Behavior During Therapy [facility] for tasks assessed/performed            Past Medical History:  Diagnosis Date   Anxiety    Chronic kidney disease    "stage 3" per pt   Fatigue    Headache    Hypertension    Obesity 01/16/2022   takes metformin and ozempic "for weight loss, not diabetic"   Sleep apnea    dx 15 years ago, never used CPAP   Past Surgical History:  Procedure Laterality Date   CHOLECYSTECTOMY     LAPAROSCOPIC HYSTERECTOMY     REVERSE SHOULDER ARTHROPLASTY Left 08/17/2022   Procedure: REVERSE SHOULDER ARTHROPLASTY;  Surgeon: Bjorn Pippin, MD;  Location: [facility];  Service: Orthopedics;  Laterality: Left;   SHOULDER ARTHROSCOPY WITH DISTAL CLAVICLE RESECTION Left 01/26/2022   Procedure: SHOULDER ARTHROSCOPY WITH DISTAL CLAVICLE EXCISION/ DEBRIDEMENT;  Surgeon: Bjorn Pippin, MD;  Location: [facility];  Service: Orthopedics;  Laterality: Left;   SHOULDER ARTHROSCOPY WITH SUBACROMIAL DECOMPRESSION, ROTATOR CUFF REPAIR AND BICEP TENDON REPAIR Left 01/26/2022   Procedure: SHOULDER ARTHROSCOPY WITH SUBACROMIAL DECOMPRESSION, ROTATOR CUFF REPAIR;  Surgeon: Bjorn Pippin, MD;  Location: [facility];  Service: Orthopedics;  Laterality: Left;   TONSILLECTOMY     TUBAL LIGATION     Patient Active Problem List   Diagnosis Date Noted   Lumbar degenerative disc disease 11/30/2021   Impingement syndrome, shoulder, left 03/14/2021   Primary osteoarthritis of both knees 03/14/2021   REFERRING PROVIDER: Ramond Marrow  MD  REFERRING DIAG: Left total shoulder replacement.  THERAPY DIAG:  Chronic left shoulder pain  Stiffness of left shoulder, not elsewhere classified  Acute pain of left shoulder  Rationale for Evaluation and Treatment: Rehabilitation  ONSET DATE: 08/17/22 (surgery date).  SUBJECTIVE:                                                                                                                                                                                      SUBJECTIVE STATEMENT: Reports tripping on the way up stairs and reflexively grabbed the railings which resulted in some discomfort.  PERTINENT HISTORY: Left RCR, HTN.  PAIN:  Are you having pain? Yes: NPRS scale: 4/10 Pain location: Left shoulder. Pain description: As above. Aggravating factors: Sleeping. Relieving factors: Rest in sling.  PRECAUTIONS: Fall.  Please be with patient at all times.  WEIGHT BEARING RESTRICTIONS:  No left UE weight bearing.  FALLS:  Has patient fallen in last 6 months? Yes. Number of falls 3.  Patient states her knees have given way. MD's looking into cause.  Recommend cane on non-affected right side for additional safety.  PLOF: Independent  PATIENT GOALS:Use left UE without pain.  NEXT MD VISIT: 10/2022  OBJECTIVE:     AROM  Left 12/26  Shoulder flexion   Shoulder extension   Shoulder abduction   Shoulder adduction   Shoulder internal rotation   Shoulder external rotation 66  Elbow flexion   Elbow extension   Wrist flexion   Wrist extension   Wrist ulnar deviation   Wrist radial deviation   Wrist pronation   Wrist supination   (Blank rows = not tested)                                       EXERCISE LOG  Exercise Repetitions and Resistance Comments  Pulleys X5 mins flexion   UE ranger Seated; flexion, circles   Chest press  AAROM x 20 reps    Flexion AAROM x 20 reps    ER AAROM x 20 reps x 5 sec hold     Blank cell = exercise not performed today   Manual  Therapy Passive ROM: L shoulder, into flexion, ER    ASSESSMENT:  CLINICAL IMPRESSION: Patient presented in clinic with mild L shoulder pain after reflexively reaching out to catch herself on railings by the stairs. Patient able to tolerate all of AAROM exercises well with no complaints of any increased pain. Great ER observed throughout session today. Firm end feels and smooth arc of motion noted during PROM in all directions.  OBJECTIVE IMPAIRMENTS: decreased activity tolerance, decreased ROM, and pain.   ACTIVITY LIMITATIONS: carrying, lifting, sleeping, and reach over head  PARTICIPATION LIMITATIONS: meal prep, cleaning, and laundry  PERSONAL FACTORS: 1 comorbidity: previous left shoulder surgery  are also affecting patient's functional outcome.   REHAB POTENTIAL: Excellent  CLINICAL DECISION MAKING: Stable/uncomplicated  EVALUATION COMPLEXITY: Low  GOALS:  LONG TERM GOALS: Target date: 6 weeks.  Ind with HEP. Baseline:  Goal status: IN PROGRESS  2.  Active left shoulder flexion to 145 degrees so the patient can easily reach overhead. Baseline:  Goal status: IN PROGRESS  3.  Active ER to 60 degrees+ to allow for easily donning/doffing of apparel. Baseline:  Goal status: IN PROGRESS  4.  Increase left shoulder strength to a solid 4/5 to increase stability for performance of functional activities. Baseline:  Goal status: IN PROGRESS  5.  Perform ADL's with pain not > 3/10. Baseline:  Goal status: IN PROGRESS  PLAN:  PT FREQUENCY: 2x/week  PT DURATION: 6 weeks  PLANNED INTERVENTIONS: Therapeutic exercises, Therapeutic activity, Neuromuscular re-education, Patient/Family education, Self Care, and Manual therapy  PLAN FOR NEXT SESSION: progress per protocol.  Begin with gentle PROM to patient's left shoulder. No Sherril Croon, PTA 10/03/2022, 9:44 AM

## 2022-10-05 ENCOUNTER — Ambulatory Visit: Payer: Medicaid Other | Admitting: Physical Therapy

## 2022-10-05 ENCOUNTER — Encounter: Payer: Self-pay | Admitting: Physical Therapy

## 2022-10-05 DIAGNOSIS — G8929 Other chronic pain: Secondary | ICD-10-CM

## 2022-10-05 DIAGNOSIS — M25512 Pain in left shoulder: Secondary | ICD-10-CM | POA: Diagnosis not present

## 2022-10-05 DIAGNOSIS — M25612 Stiffness of left shoulder, not elsewhere classified: Secondary | ICD-10-CM

## 2022-10-05 NOTE — Therapy (Signed)
OUTPATIENT PHYSICAL THERAPY SHOULDER TREATMENT   Patient Name: Patricia Carrillo MRN: [id number] DOB:[birth year], 56 y.o., female Today's Date: 10/05/2022  END OF SESSION:  PT End of Session - 10/05/22 0825     Visit Number 7    Number of Visits 12    Date for PT Re-Evaluation 10/23/22    PT Start Time 0820    PT Stop Time 0859    PT Time Calculation (min) 39 min    Activity Tolerance Patient tolerated treatment well    Behavior During Therapy [facility] for tasks assessed/performed            Past Medical History:  Diagnosis Date   Anxiety    Chronic kidney disease    "stage 3" per pt   Fatigue    Headache    Hypertension    Obesity 01/16/2022   takes metformin and ozempic "for weight loss, not diabetic"   Sleep apnea    dx 15 years ago, never used CPAP   Past Surgical History:  Procedure Laterality Date   CHOLECYSTECTOMY     LAPAROSCOPIC HYSTERECTOMY     REVERSE SHOULDER ARTHROPLASTY Left 08/17/2022   Procedure: REVERSE SHOULDER ARTHROPLASTY;  Surgeon: Hiram Gash, MD;  Location: [place];  Service: Orthopedics;  Laterality: Left;   SHOULDER ARTHROSCOPY WITH DISTAL CLAVICLE RESECTION Left 01/26/2022   Procedure: SHOULDER ARTHROSCOPY WITH DISTAL CLAVICLE EXCISION/ DEBRIDEMENT;  Surgeon: Hiram Gash, MD;  Location: [place];  Service: Orthopedics;  Laterality: Left;   SHOULDER ARTHROSCOPY WITH SUBACROMIAL DECOMPRESSION, ROTATOR CUFF REPAIR AND BICEP TENDON REPAIR Left 01/26/2022   Procedure: SHOULDER ARTHROSCOPY WITH SUBACROMIAL DECOMPRESSION, ROTATOR CUFF REPAIR;  Surgeon: Hiram Gash, MD;  Location: [place];  Service: Orthopedics;  Laterality: Left;   TONSILLECTOMY     TUBAL LIGATION     Patient Active Problem List   Diagnosis Date Noted   Lumbar degenerative disc disease 11/30/2021   Impingement syndrome, shoulder, left 03/14/2021   Primary osteoarthritis of both knees 03/14/2021   REFERRING PROVIDER: Ophelia Charter  MD  REFERRING DIAG: Left total shoulder replacement.  THERAPY DIAG:  Chronic left shoulder pain  Stiffness of left shoulder, not elsewhere classified  Rationale for Evaluation and Treatment: Rehabilitation  ONSET DATE: 08/17/22 (surgery date).  SUBJECTIVE:                                                                                                                                                                                      SUBJECTIVE STATEMENT: Mild shoulder pain today.  PERTINENT HISTORY: Left RCR, HTN.  PAIN:  Are you having pain? Yes: NPRS scale: 2-3/10 Pain location:  Left shoulder. Pain description: As above. Aggravating factors: Sleeping. Relieving factors: Rest in sling.  PRECAUTIONS: Fall.  Please be with patient at all times.  WEIGHT BEARING RESTRICTIONS:  No left UE weight bearing.  FALLS:  Has patient fallen in last 6 months? Yes. Number of falls 3.  Patient states her knees have given way. MD's looking into cause.  Recommend cane on non-affected right side for additional safety.  PLOF: Independent  PATIENT GOALS:Use left UE without pain.  NEXT MD VISIT: 10/2022  OBJECTIVE:     AROM  Left 12/26  Shoulder flexion   Shoulder extension   Shoulder abduction   Shoulder adduction   Shoulder internal rotation   Shoulder external rotation 66  Elbow flexion   Elbow extension   Wrist flexion   Wrist extension   Wrist ulnar deviation   Wrist radial deviation   Wrist pronation   Wrist supination   (Blank rows = not tested)                                       EXERCISE LOG  Exercise Repetitions and Resistance Comments  Pulleys X5 mins flexion   UE ranger Seated; flexion, circles   Wall ladder X8 reps   Chest press  AAROM x 20 reps    Flexion AAROM x 20 reps    ER AAROM x 20 reps      Blank cell = exercise not performed today   Manual Therapy Passive ROM: L shoulder, into flexion, ER    ASSESSMENT:  CLINICAL IMPRESSION: Patient  presented in clinic with reports of minimal shoulder pain. Patient able to tolerate therex well with no complaints of any increased pain. Momentary discomfort with AAROM ER but able to continue. Firm end feels and smooth arc of motion noted during PROM. No modalities.  OBJECTIVE IMPAIRMENTS: decreased activity tolerance, decreased ROM, and pain.   ACTIVITY LIMITATIONS: carrying, lifting, sleeping, and reach over head  PARTICIPATION LIMITATIONS: meal prep, cleaning, and laundry  PERSONAL FACTORS: 1 comorbidity: previous left shoulder surgery  are also affecting patient's functional outcome.   REHAB POTENTIAL: Excellent  CLINICAL DECISION MAKING: Stable/uncomplicated  EVALUATION COMPLEXITY: Low  GOALS:  LONG TERM GOALS: Target date: 6 weeks.  Ind with HEP. Baseline:  Goal status: IN PROGRESS  2.  Active left shoulder flexion to 145 degrees so the patient can easily reach overhead. Baseline:  Goal status: IN PROGRESS  3.  Active ER to 60 degrees+ to allow for easily donning/doffing of apparel. Baseline:  Goal status: MET  4.  Increase left shoulder strength to a solid 4/5 to increase stability for performance of functional activities. Baseline:  Goal status: IN PROGRESS  5.  Perform ADL's with pain not > 3/10. Baseline:  Goal status: IN PROGRESS  PLAN:  PT FREQUENCY: 2x/week  PT DURATION: 6 weeks  PLANNED INTERVENTIONS: Therapeutic exercises, Therapeutic activity, Neuromuscular re-education, Patient/Family education, Self Care, and Manual therapy  PLAN FOR NEXT SESSION: progress per protocol.  Begin with gentle PROM to patient's left shoulder. No Vaso  Standley Brooking, PTA 10/05/2022, 10:15 AM

## 2022-10-12 ENCOUNTER — Ambulatory Visit: Payer: Medicaid Other

## 2022-10-13 ENCOUNTER — Ambulatory Visit: Payer: Medicaid Other | Attending: Orthopaedic Surgery

## 2022-10-13 DIAGNOSIS — G8929 Other chronic pain: Secondary | ICD-10-CM | POA: Insufficient documentation

## 2022-10-13 DIAGNOSIS — M25612 Stiffness of left shoulder, not elsewhere classified: Secondary | ICD-10-CM | POA: Diagnosis present

## 2022-10-13 DIAGNOSIS — M25512 Pain in left shoulder: Secondary | ICD-10-CM | POA: Diagnosis not present

## 2022-10-13 NOTE — Therapy (Signed)
OUTPATIENT PHYSICAL THERAPY SHOULDER TREATMENT   Patient Name: Patricia Carrillo MRN: [id number] DOB:April 29, 1966, 57 y.o., female Today's Date: 10/13/2022  END OF SESSION:  PT End of Session - 10/13/22 0836     Visit Number 8    Number of Visits 12    Date for PT Re-Evaluation 10/23/22    PT Start Time 0825    Activity Tolerance Patient tolerated treatment well    Behavior During Therapy [facility] for tasks assessed/performed            Past Medical History:  Diagnosis Date   Anxiety    Chronic kidney disease    "stage 3" per pt   Fatigue    Headache    Hypertension    Obesity 01/16/2022   takes metformin and ozempic "for weight loss, not diabetic"   Sleep apnea    dx 15 years ago, never used CPAP   Past Surgical History:  Procedure Laterality Date   CHOLECYSTECTOMY     LAPAROSCOPIC HYSTERECTOMY     REVERSE SHOULDER ARTHROPLASTY Left 08/17/2022   Procedure: REVERSE SHOULDER ARTHROPLASTY;  Surgeon: Hiram Gash, MD;  Location: [place];  Service: Orthopedics;  Laterality: Left;   SHOULDER ARTHROSCOPY WITH DISTAL CLAVICLE RESECTION Left 01/26/2022   Procedure: SHOULDER ARTHROSCOPY WITH DISTAL CLAVICLE EXCISION/ DEBRIDEMENT;  Surgeon: Hiram Gash, MD;  Location: [place];  Service: Orthopedics;  Laterality: Left;   SHOULDER ARTHROSCOPY WITH SUBACROMIAL DECOMPRESSION, ROTATOR CUFF REPAIR AND BICEP TENDON REPAIR Left 01/26/2022   Procedure: SHOULDER ARTHROSCOPY WITH SUBACROMIAL DECOMPRESSION, ROTATOR CUFF REPAIR;  Surgeon: Hiram Gash, MD;  Location: [place];  Service: Orthopedics;  Laterality: Left;   TONSILLECTOMY     TUBAL LIGATION     Patient Active Problem List   Diagnosis Date Noted   Lumbar degenerative disc disease 11/30/2021   Impingement syndrome, shoulder, left 03/14/2021   Primary osteoarthritis of both knees 03/14/2021   REFERRING PROVIDER: Ophelia Charter MD  REFERRING DIAG: Left total shoulder  replacement.  THERAPY DIAG:  Chronic left shoulder pain  Stiffness of left shoulder, not elsewhere classified  Rationale for Evaluation and Treatment: Rehabilitation  ONSET DATE: 08/17/22 (surgery date).  SUBJECTIVE:                                                                                                                                                                                      SUBJECTIVE STATEMENT: Pt reports falling twice in the past two days.  Yesterday pt fell with left LE giving way, hitting left shoulder on counter.  Pt required assist from husband to get up.  Today pt's left LE gave way,  but pt was able to get up on her own.   Pt denies hitting her head on either occasion.  Pt also reports difficulty holding her phone in her left hand today.     PERTINENT HISTORY: Left RCR, HTN.  PAIN:  Are you having pain? Yes: NPRS scale: 4/10 Pain location: Left shoulder. Pain description: As above. Aggravating factors: Sleeping. Relieving factors: Rest in sling.  PRECAUTIONS: Fall.  Please be with patient at all times.  WEIGHT BEARING RESTRICTIONS:  No left UE weight bearing.  FALLS:  Has patient fallen in last 6 months? Yes. Number of falls 3.  Patient states her knees have given way. MD's looking into cause.  Recommend cane on non-affected right side for additional safety.  PLOF: Independent  PATIENT GOALS:Use left UE without pain.  NEXT MD VISIT: 10/2022  OBJECTIVE:     AROM  Left 12/26  Shoulder flexion   Shoulder extension   Shoulder abduction   Shoulder adduction   Shoulder internal rotation   Shoulder external rotation 66  Elbow flexion   Elbow extension   Wrist flexion   Wrist extension   Wrist ulnar deviation   Wrist radial deviation   Wrist pronation   Wrist supination   (Blank rows = not tested)                                       EXERCISE LOG  Exercise Repetitions and Resistance Comments  Pulleys X5 mins flexion   UE ranger Seated;  flexion, circles   Wall ladder X10 reps   Chest press  AAROM x 25 reps    Flexion AAROM x 25 reps; AROM x 10 reps    ER AAROM x 25 reps      Blank cell = exercise not performed today   Manual Therapy Passive ROM: L shoulder, into flexion, ER    ASSESSMENT:  CLINICAL IMPRESSION: Pt arrives for today's treatment session reporting 4/10 left shoulder pain.  Pt reports falling twice in the past two days.  Yesterday pt fell with left LE giving way, hitting left shoulder on counter.  Pt required assist from husband to get up.  Today pt's left LE gave way, but pt was able to get up on her own.   Pt denies hitting her head on either occasion.  Pt also reports difficulty holding her phone in her left hand today.   Pt encouraged to contact MD immediately after treatment session today reporting new symptoms.  Pt able to tolerate increased reps with all AAROM today and incorporate active left shoulder flexion in supine.  Pt denied any increased in pain at completion of today's treatment session.  OBJECTIVE IMPAIRMENTS: decreased activity tolerance, decreased ROM, and pain.   ACTIVITY LIMITATIONS: carrying, lifting, sleeping, and reach over head  PARTICIPATION LIMITATIONS: meal prep, cleaning, and laundry  PERSONAL FACTORS: 1 comorbidity: previous left shoulder surgery  are also affecting patient's functional outcome.   REHAB POTENTIAL: Excellent  CLINICAL DECISION MAKING: Stable/uncomplicated  EVALUATION COMPLEXITY: Low  GOALS:  LONG TERM GOALS: Target date: 6 weeks.  Ind with HEP. Baseline:  Goal status: IN PROGRESS  2.  Active left shoulder flexion to 145 degrees so the patient can easily reach overhead. Baseline:  Goal status: IN PROGRESS  3.  Active ER to 60 degrees+ to allow for easily donning/doffing of apparel. Baseline:  Goal status: MET  4.  Increase left  shoulder strength to a solid 4/5 to increase stability for performance of functional activities. Baseline:  Goal status:  IN PROGRESS  5.  Perform ADL's with pain not > 3/10. Baseline:  Goal status: IN PROGRESS  PLAN:  PT FREQUENCY: 2x/week  PT DURATION: 6 weeks  PLANNED INTERVENTIONS: Therapeutic exercises, Therapeutic activity, Neuromuscular re-education, Patient/Family education, Self Care, and Manual therapy  PLAN FOR NEXT SESSION: progress per protocol.  Begin with gentle PROM to patient's left shoulder. No Vaso  Newman Pies, PTA 10/13/2022, 8:38 AM

## 2022-10-17 ENCOUNTER — Encounter: Payer: Medicaid Other | Admitting: *Deleted

## 2022-10-20 ENCOUNTER — Ambulatory Visit: Payer: Medicaid Other

## 2022-10-20 DIAGNOSIS — M25612 Stiffness of left shoulder, not elsewhere classified: Secondary | ICD-10-CM

## 2022-10-20 DIAGNOSIS — M25512 Pain in left shoulder: Secondary | ICD-10-CM | POA: Diagnosis not present

## 2022-10-20 DIAGNOSIS — G8929 Other chronic pain: Secondary | ICD-10-CM

## 2022-10-20 NOTE — Therapy (Signed)
OUTPATIENT PHYSICAL THERAPY SHOULDER TREATMENT   Patient Name: Patricia Carrillo MRN: [id number] DOB:1966-06-05, 57 y.o., female Today's Date: 10/20/2022  END OF SESSION:  PT End of Session - 10/20/22 0820     Visit Number 9    Number of Visits 12    Date for PT Re-Evaluation 10/23/22    Authorization - Number of Visits 13     PT Start Time 0815    PT Stop Time 0859    PT Time Calculation (min) 44 min    Activity Tolerance Patient tolerated treatment well    Behavior During Therapy [facility] for tasks assessed/performed             Past Medical History:  Diagnosis Date   Anxiety    Chronic kidney disease    "stage 3" per pt   Fatigue    Headache    Hypertension    Obesity 01/16/2022   takes metformin and ozempic "for weight loss, not diabetic"   Sleep apnea    dx 15 years ago, never used CPAP   Past Surgical History:  Procedure Laterality Date   CHOLECYSTECTOMY     LAPAROSCOPIC HYSTERECTOMY     REVERSE SHOULDER ARTHROPLASTY Left 08/17/2022   Procedure: REVERSE SHOULDER ARTHROPLASTY;  Surgeon: Bjorn Pippin, MD;  Location: [facility];  Service: Orthopedics;  Laterality: Left;   SHOULDER ARTHROSCOPY WITH DISTAL CLAVICLE RESECTION Left 01/26/2022   Procedure: SHOULDER ARTHROSCOPY WITH DISTAL CLAVICLE EXCISION/ DEBRIDEMENT;  Surgeon: Bjorn Pippin, MD;  Location: [facility];  Service: Orthopedics;  Laterality: Left;   SHOULDER ARTHROSCOPY WITH SUBACROMIAL DECOMPRESSION, ROTATOR CUFF REPAIR AND BICEP TENDON REPAIR Left 01/26/2022   Procedure: SHOULDER ARTHROSCOPY WITH SUBACROMIAL DECOMPRESSION, ROTATOR CUFF REPAIR;  Surgeon: Bjorn Pippin, MD;  Location: [facility];  Service: Orthopedics;  Laterality: Left;   TONSILLECTOMY     TUBAL LIGATION     Patient Active Problem List   Diagnosis Date Noted   Lumbar degenerative disc disease 11/30/2021   Impingement syndrome, shoulder, left 03/14/2021   Primary osteoarthritis of both knees  03/14/2021   REFERRING PROVIDER: Ramond Marrow MD  REFERRING DIAG: Left total shoulder replacement.  THERAPY DIAG:  Chronic left shoulder pain  Stiffness of left shoulder, not elsewhere classified  Rationale for Evaluation and Treatment: Rehabilitation  ONSET DATE: 08/17/22 (surgery date).  SUBJECTIVE:                                                                                                                                                                                      SUBJECTIVE STATEMENT: Patient reports that she fell yesterday. She notes that she was walking  when she felt her legs give out causing her to fall. She notes that she is having a throbbing and pulling between her neck and left shoulder.   PERTINENT HISTORY: Left RCR, HTN.  PAIN:  Are you having pain? Yes: NPRS scale: 5/10 Pain location: Left shoulder. Pain description: As above. Aggravating factors: Sleeping. Relieving factors: Rest in sling.  PRECAUTIONS: Fall.  Please be with patient at all times.  WEIGHT BEARING RESTRICTIONS:  No left UE weight bearing.  FALLS:  Has patient fallen in last 6 months? Yes. Number of falls 3.  Patient states her knees have given way. MD's looking into cause.  Recommend cane on non-affected right side for additional safety.  PLOF: Independent  PATIENT GOALS:Use left UE without pain.  NEXT MD VISIT: 10/2022  OBJECTIVE:     AROM  Left 12/26  Shoulder flexion   Shoulder extension   Shoulder abduction   Shoulder adduction   Shoulder internal rotation   Shoulder external rotation 66  Elbow flexion   Elbow extension   Wrist flexion   Wrist extension   Wrist ulnar deviation   Wrist radial deviation   Wrist pronation   Wrist supination   (Blank rows = not tested)                                       1/12 EXERCISE LOG  Exercise Repetitions and Resistance Comments  Pulleys  6 minutes    L upper trapezius stretch  4 x 30 seconds   Supine cane press up 2# x  2 minutes   Supine cane flexion  2# x 2 minutes    Therabar bending (up and down)  Red t-bar x 2 minutes each    Resisted shoulder ER  Yellow t-band x 30 reps    AA cane flexion (seated)  20 reps    Resisted row  Red t-band x 3 minutes   Resisted IR  Red t-band x 2 min    Blank cell = exercise not performed today   ASSESSMENT:  CLINICAL IMPRESSION: Patient was introduced to multiple new interventions for improved shoulder mobility and strength. She required minimal cueing with today's resisted interventions for proper biomechanics to avoid compensatory patterns. She reported no increased in pain or discomfort with any of today's interventions. However, she continues to exhibit increased difficulty with anti-gravity movements such as open chain shoulder flexion in sitting and standing. She reported feeling good upon the conclusion of treatment. She continues to require skilled physical therapy to address her remaining impairments to maximize her functional mobility.   OBJECTIVE IMPAIRMENTS: decreased activity tolerance, decreased ROM, and pain.   ACTIVITY LIMITATIONS: carrying, lifting, sleeping, and reach over head  PARTICIPATION LIMITATIONS: meal prep, cleaning, and laundry  PERSONAL FACTORS: 1 comorbidity: previous left shoulder surgery  are also affecting patient's functional outcome.   REHAB POTENTIAL: Excellent  CLINICAL DECISION MAKING: Stable/uncomplicated  EVALUATION COMPLEXITY: Low  GOALS:  LONG TERM GOALS: Target date: 6 weeks.  Ind with HEP. Baseline:  Goal status: IN PROGRESS  2.  Active left shoulder flexion to 145 degrees so the patient can easily reach overhead. Baseline:  Goal status: IN PROGRESS  3.  Active ER to 60 degrees+ to allow for easily donning/doffing of apparel. Baseline:  Goal status: MET  4.  Increase left shoulder strength to a solid 4/5 to increase stability for performance of functional activities. Baseline:  Goal status: IN PROGRESS  5.   Perform ADL's with pain not > 3/10. Baseline:  Goal status: IN PROGRESS  PLAN:  PT FREQUENCY: 2x/week  PT DURATION: 6 weeks  PLANNED INTERVENTIONS: Therapeutic exercises, Therapeutic activity, Neuromuscular re-education, Patient/Family education, Self Care, and Manual therapy  PLAN FOR NEXT SESSION: progress per protocol.  Begin with gentle PROM to patient's left shoulder. No Henderson Cloud, PT 10/20/2022, 12:52 PM

## 2022-10-24 ENCOUNTER — Ambulatory Visit: Payer: Medicaid Other | Admitting: Physical Therapy

## 2022-10-24 ENCOUNTER — Encounter: Payer: Self-pay | Admitting: Physical Therapy

## 2022-10-24 DIAGNOSIS — M25512 Pain in left shoulder: Secondary | ICD-10-CM | POA: Diagnosis not present

## 2022-10-24 DIAGNOSIS — M25612 Stiffness of left shoulder, not elsewhere classified: Secondary | ICD-10-CM

## 2022-10-24 DIAGNOSIS — G8929 Other chronic pain: Secondary | ICD-10-CM

## 2022-10-24 NOTE — Therapy (Addendum)
OUTPATIENT PHYSICAL THERAPY SHOULDER TREATMENT   Patient Name: Patricia Carrillo MRN: [id number] DOB:1966/01/28, 57 y.o., female Today's Date: 10/24/2022  END OF SESSION:  PT End of Session - 10/24/22 0817     Visit Number 10    Number of Visits 12    Date for PT Re-Evaluation 10/23/22    Authorization - Number of Visits 13    PT Start Time 0818    PT Stop Time 0858    PT Time Calculation (min) 40 min    Activity Tolerance Patient tolerated treatment well    Behavior During Therapy [facility] for tasks assessed/performed            Past Medical History:  Diagnosis Date   Anxiety    Chronic kidney disease    "stage 3" per pt   Fatigue    Headache    Hypertension    Obesity 01/16/2022   takes metformin and ozempic "for weight loss, not diabetic"   Sleep apnea    dx 15 years ago, never used CPAP   Past Surgical History:  Procedure Laterality Date   CHOLECYSTECTOMY     LAPAROSCOPIC HYSTERECTOMY     REVERSE SHOULDER ARTHROPLASTY Left 08/17/2022   Procedure: REVERSE SHOULDER ARTHROPLASTY;  Surgeon: Hiram Gash, MD;  Location: [place];  Service: Orthopedics;  Laterality: Left;   SHOULDER ARTHROSCOPY WITH DISTAL CLAVICLE RESECTION Left 01/26/2022   Procedure: SHOULDER ARTHROSCOPY WITH DISTAL CLAVICLE EXCISION/ DEBRIDEMENT;  Surgeon: Hiram Gash, MD;  Location: [place];  Service: Orthopedics;  Laterality: Left;   SHOULDER ARTHROSCOPY WITH SUBACROMIAL DECOMPRESSION, ROTATOR CUFF REPAIR AND BICEP TENDON REPAIR Left 01/26/2022   Procedure: SHOULDER ARTHROSCOPY WITH SUBACROMIAL DECOMPRESSION, ROTATOR CUFF REPAIR;  Surgeon: Hiram Gash, MD;  Location: [place];  Service: Orthopedics;  Laterality: Left;   TONSILLECTOMY     TUBAL LIGATION     Patient Active Problem List   Diagnosis Date Noted   Lumbar degenerative disc disease 11/30/2021   Impingement syndrome, shoulder, left 03/14/2021   Primary osteoarthritis of both knees  03/14/2021   REFERRING PROVIDER: Ophelia Charter MD  REFERRING DIAG: Left total shoulder replacement.  THERAPY DIAG:  Chronic left shoulder pain  Stiffness of left shoulder, not elsewhere classified  Rationale for Evaluation and Treatment: Rehabilitation  ONSET DATE: 08/17/22 (surgery date).  SUBJECTIVE:                                                                                                                                                                                      SUBJECTIVE STATEMENT: Reports more achey and couldn't don bra today.  PERTINENT HISTORY: Left RCR, HTN.  PAIN:  Are you having pain? Yes: NPRS scale: no pain score provided/10 Pain location: Left shoulder. Pain description: As above. Aggravating factors: Sleeping. Relieving factors: Rest in sling.  PRECAUTIONS: Fall.  Please be with patient at all times.  WEIGHT BEARING RESTRICTIONS:  No left UE weight bearing.  FALLS:  Has patient fallen in last 6 months? Yes. Number of falls 3.  Patient states her knees have given way. MD's looking into cause.  Recommend cane on non-affected right side for additional safety.  PLOF: Independent  PATIENT GOALS:Use left UE without pain.  NEXT MD VISIT: 12/2022  OBJECTIVE:     AROM  Left 12/26  Shoulder flexion   Shoulder extension   Shoulder abduction   Shoulder adduction   Shoulder internal rotation   Shoulder external rotation 66  Elbow flexion   Elbow extension   Wrist flexion   Wrist extension   Wrist ulnar deviation   Wrist radial deviation   Wrist pronation   Wrist supination   (Blank rows = not tested)                                       1/16 EXERCISE LOG  Exercise Repetitions and Resistance Comments  Pulleys  6 minutes    Supine cane press up x 2 minutes   Supine cane flexion   x 2 minutes    Supine cane ER X20 reps   row  AROM x30 reps   Bicep curls AROM x30 reps    Blank cell = exercise not performed today    ASSESSMENT:  CLINICAL IMPRESSION: Patient presented in clinic with reports of more achiness. Patient able to tolerate therex well with no complaints and no complaints with PROM session. Firm end feels and smooth arc of motion noted during PROM. Patient is R hand dominant and can do most things with RUE.   OBJECTIVE IMPAIRMENTS: decreased activity tolerance, decreased ROM, and pain.   ACTIVITY LIMITATIONS: carrying, lifting, sleeping, and reach over head  PARTICIPATION LIMITATIONS: meal prep, cleaning, and laundry  PERSONAL FACTORS: 1 comorbidity: previous left shoulder surgery  are also affecting patient's functional outcome.   REHAB POTENTIAL: Excellent  CLINICAL DECISION MAKING: Stable/uncomplicated  EVALUATION COMPLEXITY: Low  GOALS:  LONG TERM GOALS: Target date: 6 weeks.  Ind with HEP. Baseline:  Goal status: IN PROGRESS  2.  Active left shoulder flexion to 145 degrees so the patient can easily reach overhead. Baseline:  Goal status: IN PROGRESS  3.  Active ER to 60 degrees+ to allow for easily donning/doffing of apparel. Baseline:  Goal status: MET  4.  Increase left shoulder strength to a solid 4/5 to increase stability for performance of functional activities. Baseline:  Goal status: IN PROGRESS  5.  Perform ADL's with pain not > 3/10. Baseline:  Goal status: IN PROGRESS  PLAN:  PT FREQUENCY: 2x/week  PT DURATION: 6 weeks  PLANNED INTERVENTIONS: Therapeutic exercises, Therapeutic activity, Neuromuscular re-education, Patient/Family education, Self Care, and Manual therapy  PLAN FOR NEXT SESSION: Progress to light resistance and HEP.  Standley Brooking, PTA 10/24/2022, 9:15 AM   Progress Note Reporting Period 09/11/22 to 10/24/22  See note below for Objective Data and Assessment of Progress/Goals. Good progression per protocol.    Mali Applegate MPT

## 2022-10-31 ENCOUNTER — Encounter: Payer: Self-pay | Admitting: Physical Therapy

## 2022-10-31 ENCOUNTER — Ambulatory Visit: Payer: Medicaid Other | Admitting: Physical Therapy

## 2022-10-31 DIAGNOSIS — G8929 Other chronic pain: Secondary | ICD-10-CM

## 2022-10-31 DIAGNOSIS — M25512 Pain in left shoulder: Secondary | ICD-10-CM | POA: Diagnosis not present

## 2022-10-31 DIAGNOSIS — M25612 Stiffness of left shoulder, not elsewhere classified: Secondary | ICD-10-CM

## 2022-10-31 NOTE — Therapy (Signed)
OUTPATIENT PHYSICAL THERAPY SHOULDER TREATMENT   Patient Name: Patricia Carrillo MRN: [id number] DOB:1965-11-15, 57 y.o., female Today's Date: 10/31/2022  END OF SESSION:  PT End of Session - 10/31/22 0818     Visit Number 11    Number of Visits 12    Date for PT Re-Evaluation 10/23/22    Authorization - Number of Visits 13    PT Start Time 0816    PT Stop Time 0857    PT Time Calculation (min) 41 min    Activity Tolerance Patient tolerated treatment well    Behavior During Therapy [facility] for tasks assessed/performed            Past Medical History:  Diagnosis Date   Anxiety    Chronic kidney disease    "stage 3" per pt   Fatigue    Headache    Hypertension    Obesity 01/16/2022   takes metformin and ozempic "for weight loss, not diabetic"   Sleep apnea    dx 15 years ago, never used CPAP   Past Surgical History:  Procedure Laterality Date   CHOLECYSTECTOMY     LAPAROSCOPIC HYSTERECTOMY     REVERSE SHOULDER ARTHROPLASTY Left 08/17/2022   Procedure: REVERSE SHOULDER ARTHROPLASTY;  Surgeon: Hiram Gash, MD;  Location: [place];  Service: Orthopedics;  Laterality: Left;   SHOULDER ARTHROSCOPY WITH DISTAL CLAVICLE RESECTION Left 01/26/2022   Procedure: SHOULDER ARTHROSCOPY WITH DISTAL CLAVICLE EXCISION/ DEBRIDEMENT;  Surgeon: Hiram Gash, MD;  Location: [place];  Service: Orthopedics;  Laterality: Left;   SHOULDER ARTHROSCOPY WITH SUBACROMIAL DECOMPRESSION, ROTATOR CUFF REPAIR AND BICEP TENDON REPAIR Left 01/26/2022   Procedure: SHOULDER ARTHROSCOPY WITH SUBACROMIAL DECOMPRESSION, ROTATOR CUFF REPAIR;  Surgeon: Hiram Gash, MD;  Location: [place];  Service: Orthopedics;  Laterality: Left;   TONSILLECTOMY     TUBAL LIGATION     Patient Active Problem List   Diagnosis Date Noted   Lumbar degenerative disc disease 11/30/2021   Impingement syndrome, shoulder, left 03/14/2021   Primary osteoarthritis of both knees  03/14/2021   REFERRING PROVIDER: Ophelia Charter MD  REFERRING DIAG: Left total shoulder replacement.  THERAPY DIAG:  Chronic left shoulder pain  Stiffness of left shoulder, not elsewhere classified  Rationale for Evaluation and Treatment: Rehabilitation  ONSET DATE: 08/17/22 (surgery date).  SUBJECTIVE:                                                                                                                                                                                      SUBJECTIVE STATEMENT: Has very minimal to mild shoulder pain during the day but reports most pain  is at night.  PERTINENT HISTORY: Left RCR, HTN.  PAIN:  Are you having pain? Yes: NPRS scale: 1/10 Pain location: Left shoulder. Pain description: As above. Aggravating factors: Sleeping. Relieving factors: Rest in sling.  PRECAUTIONS: Fall.  Please be with patient at all times.  PATIENT GOALS:Use left UE without pain.  NEXT MD VISIT: 12/2022  OBJECTIVE:     AROM  Left 12/26  Shoulder flexion   Shoulder extension   Shoulder abduction   Shoulder adduction   Shoulder internal rotation   Shoulder external rotation 66  Elbow flexion   Elbow extension   Wrist flexion   Wrist extension   Wrist ulnar deviation   Wrist radial deviation   Wrist pronation   Wrist supination   (Blank rows = not tested)                                      1/23 EXERCISE LOG  Exercise Repetitions and Resistance Comments  Pulleys  5 minutes    Wall slides X15 reps   Isometric ER 5x5 sec   Isometric abduction 5x5 sec   Bent sh. extension X15 reps   Bent sh. row X15 reps   Lawnchair flexion X15 reps   Lawnchair upper cut X15 reps   Bicep curl  3# x20 reps   Horizontal abduction Red x15 reps   Shoulder D2 AROM x10 reps    Blank cell = exercise not performed today   Manual Therapy Passive ROM: L shoulder, PROM into flexion, ER    ASSESSMENT:  CLINICAL IMPRESSION: Patient presented in clinic with reports of  mild pain with daytime ADLs but pain increases at night. Patient able to tolerate all therex well but limited due to muscle fatigue. Firm end feels and smooth arc of motion noted during PROM of L shoulder. No complaints of pain reported following today's session.  OBJECTIVE IMPAIRMENTS: decreased activity tolerance, decreased ROM, and pain.   ACTIVITY LIMITATIONS: carrying, lifting, sleeping, and reach over head  PARTICIPATION LIMITATIONS: meal prep, cleaning, and laundry  PERSONAL FACTORS: 1 comorbidity: previous left shoulder surgery  are also affecting patient's functional outcome.   REHAB POTENTIAL: Excellent  CLINICAL DECISION MAKING: Stable/uncomplicated  EVALUATION COMPLEXITY: Low  GOALS:  LONG TERM GOALS: Target date: 6 weeks.  Ind with HEP. Baseline:  Goal status: MET  2.  Active left shoulder flexion to 145 degrees so the patient can easily reach overhead. Baseline:  Goal status: IN PROGRESS  3.  Active ER to 60 degrees+ to allow for easily donning/doffing of apparel. Baseline:  Goal status: MET  4.  Increase left shoulder strength to a solid 4/5 to increase stability for performance of functional activities. Baseline:  Goal status: IN PROGRESS  5.  Perform ADL's with pain not > 3/10. Baseline:  Goal status: MET  PLAN:  PT FREQUENCY: 2x/week  PT DURATION: 6 weeks  PLANNED INTERVENTIONS: Therapeutic exercises, Therapeutic activity, Neuromuscular re-education, Patient/Family education, Self Care, and Manual therapy  PLAN FOR NEXT SESSION: DC   Standley Brooking, PTA 10/31/2022, 9:12 AM

## 2022-11-02 ENCOUNTER — Ambulatory Visit: Payer: Medicaid Other | Admitting: Physical Therapy

## 2022-11-02 ENCOUNTER — Encounter: Payer: Self-pay | Admitting: Physical Therapy

## 2022-11-02 DIAGNOSIS — M25612 Stiffness of left shoulder, not elsewhere classified: Secondary | ICD-10-CM

## 2022-11-02 DIAGNOSIS — M25512 Pain in left shoulder: Secondary | ICD-10-CM | POA: Diagnosis not present

## 2022-11-02 DIAGNOSIS — G8929 Other chronic pain: Secondary | ICD-10-CM

## 2022-11-02 NOTE — Therapy (Addendum)
OUTPATIENT PHYSICAL THERAPY SHOULDER TREATMENT   Patient Name: Patricia Carrillo MRN: [id number] DOB:1965-10-14, 57 y.o., female Today's Date: 11/02/2022  END OF SESSION:  PT End of Session - 11/02/22 0819     Visit Number 12    Number of Visits 12    Date for PT Re-Evaluation 10/23/22    Authorization - Number of Visits 13    PT Start Time 0816    PT Stop Time 0857    PT Time Calculation (min) 41 min    Activity Tolerance Patient tolerated treatment well    Behavior During Therapy [facility] for tasks assessed/performed            Past Medical History:  Diagnosis Date   Anxiety    Chronic kidney disease    "stage 3" per pt   Fatigue    Headache    Hypertension    Obesity 01/16/2022   takes metformin and ozempic "for weight loss, not diabetic"   Sleep apnea    dx 15 years ago, never used CPAP   Past Surgical History:  Procedure Laterality Date   CHOLECYSTECTOMY     LAPAROSCOPIC HYSTERECTOMY     REVERSE SHOULDER ARTHROPLASTY Left 08/17/2022   Procedure: REVERSE SHOULDER ARTHROPLASTY;  Surgeon: Bjorn Pippin, MD;  Location: [facility];  Service: Orthopedics;  Laterality: Left;   SHOULDER ARTHROSCOPY WITH DISTAL CLAVICLE RESECTION Left 01/26/2022   Procedure: SHOULDER ARTHROSCOPY WITH DISTAL CLAVICLE EXCISION/ DEBRIDEMENT;  Surgeon: Bjorn Pippin, MD;  Location: [facility];  Service: Orthopedics;  Laterality: Left;   SHOULDER ARTHROSCOPY WITH SUBACROMIAL DECOMPRESSION, ROTATOR CUFF REPAIR AND BICEP TENDON REPAIR Left 01/26/2022   Procedure: SHOULDER ARTHROSCOPY WITH SUBACROMIAL DECOMPRESSION, ROTATOR CUFF REPAIR;  Surgeon: Bjorn Pippin, MD;  Location: [facility];  Service: Orthopedics;  Laterality: Left;   TONSILLECTOMY     TUBAL LIGATION     Patient Active Problem List   Diagnosis Date Noted   Lumbar degenerative disc disease 11/30/2021   Impingement syndrome, shoulder, left 03/14/2021   Primary osteoarthritis of both knees  03/14/2021   REFERRING PROVIDER: Ramond Marrow MD  REFERRING DIAG: Left total shoulder replacement.  THERAPY DIAG:  Chronic left shoulder pain  Stiffness of left shoulder, not elsewhere classified  Rationale for Evaluation and Treatment: Rehabilitation  ONSET DATE: 08/17/22 (surgery date).  SUBJECTIVE:                                                                                                                                                                                      SUBJECTIVE STATEMENT: No new complaints.  PERTINENT HISTORY: Left RCR, HTN.  PAIN:  Are you  having pain? Yes: NPRS scale: 1/10 Pain location: Left shoulder. Pain description: As above. Aggravating factors: Sleeping. Relieving factors: Rest in sling.  PRECAUTIONS: Fall.  Please be with patient at all times.  PATIENT GOALS:Use left UE without pain.  NEXT MD VISIT: 12/2022  OBJECTIVE:     AROM  Left 12/26 11/02/21  Shoulder flexion  128  Shoulder extension    Shoulder abduction    Shoulder adduction    Shoulder internal rotation    Shoulder external rotation 66   Elbow flexion    Elbow extension    Wrist flexion    Wrist extension    Wrist ulnar deviation    Wrist radial deviation    Wrist pronation    Wrist supination    (Blank rows = not tested)    UPPER EXTREMITY MMT:  MMT Left 11/02/22  Shoulder flexion 4+/5  Shoulder extension   Shoulder abduction 4/5  Shoulder adduction   Shoulder extension   Shoulder internal rotation 4+/5  Shoulder external rotation 4+/5  Middle trapezius   Lower trapezius   Elbow flexion   Elbow extension   Wrist flexion   Wrist extension   Wrist ulnar deviation   Wrist radial deviation   Wrist pronation   Wrist supination   Grip strength    (Blank rows = not tested)                                     1/25 EXERCISE LOG  Exercise Repetitions and Resistance Comments  Pulleys  6 minutes    Wall slides X15 reps   Resisted row Red x20 reps    Resisted extension Red x20 reps   Resisted IR Red x20 reps   Resisted ER Red x20 reps   Resisted protraction Red x20 reps   Lawnchair flexion X20 reps   Lawnchair upper cut X20 reps   Lawnchair protraction X20 reps   Horizontal abduction Red x15 reps   Horizontal abd with flexion Red x8 reps   Shoulder D2 AROM x10 reps    Blank cell = exercise not performed today   Manual Therapy Passive ROM: L shoulder, PROM into flexion, ER    ASSESSMENT:  CLINICAL IMPRESSION: Patient presented in clinic with minimal discomfort and no new complaints. Patient able to tolerate all therex and has already been provided red theraband for HEP. Patient demonstated less than goal status shoulder flexion in standing but could surpass goal status shoulder flexion in lawnchair position. Muscle fatigue notable during therex and requires intermittent but short duration rest breaks during therex. Met all goals except for shoulder flexion.   OBJECTIVE IMPAIRMENTS: decreased activity tolerance, decreased ROM, and pain.   ACTIVITY LIMITATIONS: carrying, lifting, sleeping, and reach over head  PARTICIPATION LIMITATIONS: meal prep, cleaning, and laundry  PERSONAL FACTORS: 1 comorbidity: previous left shoulder surgery  are also affecting patient's functional outcome.   REHAB POTENTIAL: Excellent  CLINICAL DECISION MAKING: Stable/uncomplicated  EVALUATION COMPLEXITY: Low  GOALS:  LONG TERM GOALS: Target date: 6 weeks.  Ind with HEP. Baseline:  Goal status: MET  2.  Active left shoulder flexion to 145 degrees so the patient can easily reach overhead. Baseline:  Goal status: NOT MET  3.  Active ER to 60 degrees+ to allow for easily donning/doffing of apparel. Baseline:  Goal status: MET  4.  Increase left shoulder strength to a solid 4/5 to increase stability for performance of  functional activities. Baseline:  Goal status: MET  5.  Perform ADL's with pain not > 3/10. Baseline:  Goal status:  MET  PLAN:  PT FREQUENCY: 2x/week  PT DURATION: 6 weeks  PLANNED INTERVENTIONS: Therapeutic exercises, Therapeutic activity, Neuromuscular re-education, Patient/Family education, Self Care, and Manual therapy  PLAN FOR NEXT SESSION: DC   Standley Brooking, PTA 11/02/2022, 9:05 AM   PHYSICAL THERAPY DISCHARGE SUMMARY  Visits from Start of Care: 12.  Current functional level related to goals / functional outcomes: See above.   Remaining deficits: Only LTG #2 unmet.   Education / Equipment: HEP.   Patient agrees to discharge. Patient goals were partially met. Patient is being discharged due to being pleased with the current functional level.    Mali Applegate MPT

## 2023-01-18 ENCOUNTER — Ambulatory Visit (HOSPITAL_COMMUNITY): Payer: Self-pay | Admitting: Emergency Medicine

## 2023-01-18 DIAGNOSIS — G8929 Other chronic pain: Secondary | ICD-10-CM

## 2023-01-18 NOTE — H&P (Signed)
TOTAL KNEE ADMISSION H&P  Patient is being admitted for left total knee arthroplasty.  Subjective:  Chief Complaint:left knee pain.  HPI: Patricia Carrillo, 57 y.o. female, has a history of pain and functional disability in the left knee due to arthritis and has failed non-surgical conservative treatments for greater than 12 weeks to includeNSAID's and/or analgesics, corticosteriod injections, viscosupplementation injections, supervised PT with diminished ADL's post treatment, use of assistive devices, and activity modification.  Onset of symptoms was gradual, starting >10 years ago with gradually worsening course since that time. The patient noted prior procedures on the knee to include  menisectomy on the left knee(s).  Patient currently rates pain in the left knee(s) at 10 out of 10 with activity. Patient has night pain, worsening of pain with activity and weight bearing, pain that interferes with activities of daily living, and pain with passive range of motion.  Patient has evidence of periarticular osteophytes and joint space narrowing by imaging studies. There is no active infection.  Patient Active Problem List   Diagnosis Date Noted   Lumbar degenerative disc disease 11/30/2021   Impingement syndrome, shoulder, left 03/14/2021   Primary osteoarthritis of both knees 03/14/2021   Past Medical History:  Diagnosis Date   Anxiety    Chronic kidney disease    "stage 3" per pt   Fatigue    Headache    Hypertension    Obesity 01/16/2022   takes metformin and ozempic "for weight loss, not diabetic"   Sleep apnea    dx 15 years ago, never used CPAP    Past Surgical History:  Procedure Laterality Date   CHOLECYSTECTOMY     LAPAROSCOPIC HYSTERECTOMY     REVERSE SHOULDER ARTHROPLASTY Left 08/17/2022   Procedure: REVERSE SHOULDER ARTHROPLASTY;  Surgeon: Bjorn Pippin, MD;  Location: [facility];  Service: Orthopedics;  Laterality: Left;   SHOULDER ARTHROSCOPY WITH DISTAL  CLAVICLE RESECTION Left 01/26/2022   Procedure: SHOULDER ARTHROSCOPY WITH DISTAL CLAVICLE EXCISION/ DEBRIDEMENT;  Surgeon: Bjorn Pippin, MD;  Location: [facility];  Service: Orthopedics;  Laterality: Left;   SHOULDER ARTHROSCOPY WITH SUBACROMIAL DECOMPRESSION, ROTATOR CUFF REPAIR AND BICEP TENDON REPAIR Left 01/26/2022   Procedure: SHOULDER ARTHROSCOPY WITH SUBACROMIAL DECOMPRESSION, ROTATOR CUFF REPAIR;  Surgeon: Bjorn Pippin, MD;  Location: [facility];  Service: Orthopedics;  Laterality: Left;   TONSILLECTOMY     TUBAL LIGATION      Current Outpatient Medications  Medication Sig Dispense Refill Last Dose   gabapentin (NEURONTIN) 800 MG tablet Take 1-2 tablets (800-1,600 mg total) by mouth 2 (two) times daily. 360 tablet 3    lisinopril-hydrochlorothiazide (ZESTORETIC) 20-12.5 MG tablet Take 1 tablet by mouth 2 (two) times daily.      lovastatin (MEVACOR) 20 MG tablet Take 20 mg by mouth daily.      Misc. Devices (QUAD CANE) MISC Four post quad cane 1 each 0    Misc. Devices (ROLLING WALKER/BURGUNDY) MISC Rolling walker for daily use 1 each 0    OXYCODONE HCL PO Take by mouth.      Semaglutide (OZEMPIC, 0.25 OR 0.5 MG/DOSE, [place]) Inject into the skin.      tiZANidine (ZANAFLEX) 4 MG tablet Take 4 mg by mouth 3 (three) times daily.      No current facility-administered medications for this visit.   No Known Allergies  Social History   Tobacco Use   Smoking status: Never   Smokeless tobacco: Never  Substance Use Topics  Alcohol use: Not Currently    No family history on file.   Review of Systems  Musculoskeletal:  Positive for arthralgias.    Objective:  Physical Exam Constitutional:      General: She is not in acute distress.    Appearance: Normal appearance. She is not ill-appearing.  HENT:     Head: Normocephalic and atraumatic.     Right Ear: External ear normal.     Left Ear: External ear normal.     Nose: Nose normal.     Mouth/Throat:      Mouth: Mucous membranes are moist.     Pharynx: Oropharynx is clear.  Eyes:     Extraocular Movements: Extraocular movements intact.     Conjunctiva/sclera: Conjunctivae normal.  Cardiovascular:     Rate and Rhythm: Normal rate and regular rhythm.     Pulses: Normal pulses.     Heart sounds: Normal heart sounds.  Pulmonary:     Effort: Pulmonary effort is normal.     Breath sounds: Normal breath sounds.  Abdominal:     General: Bowel sounds are normal.     Palpations: Abdomen is soft.     Tenderness: There is no abdominal tenderness.  Musculoskeletal:        General: Tenderness present.     Cervical back: Normal range of motion and neck supple.     Comments: Tenderness to medial aspect of left knee.  Reduced ROM in all directions due to pain.  BLE appear grossly neurovascularly intact.  Decreased strength of LLE.  No lesions or erythema appreciated on area of chief complaint.  Gait appears antalgic.    Skin:    General: Skin is warm and dry.  Neurological:     Mental Status: She is alert and oriented to person, place, and time. Mental status is at baseline.  Psychiatric:        Mood and Affect: Mood normal.        Behavior: Behavior normal.     Vital signs in last 24 hours: @VSRANGES @  Labs:   Estimated body mass index is 43.41 kg/m as calculated from the following:   Height as of 08/17/22: 5\' 1"  (1.549 m).   Weight as of 08/17/22: 104.2 kg.   Imaging Review Plain radiographs demonstrate severe degenerative joint disease of the left knee(s). The overall alignment ismild varus. The bone quality appears to be good for age and reported activity level.      Assessment/Plan:  End stage arthritis, left knee   The patient history, physical examination, clinical judgment of the provider and imaging studies are consistent with end stage degenerative joint disease of the left knee(s) and total knee arthroplasty is deemed medically necessary. The treatment options  including medical management, injection therapy arthroscopy and arthroplasty were discussed at length. The risks and benefits of total knee arthroplasty were presented and reviewed. The risks due to aseptic loosening, infection, stiffness, patella tracking problems, thromboembolic complications and other imponderables were discussed. The patient acknowledged the explanation, agreed to proceed with the plan and consent was signed. Patient is being admitted for inpatient treatment for surgery, pain control, PT, OT, prophylactic antibiotics, VTE prophylaxis, progressive ambulation and ADL's and discharge planning. The patient is planning to be discharged home with outpatient PT

## 2023-01-18 NOTE — H&P (View-Only) (Signed)
TOTAL KNEE ADMISSION H&P  Patient is being admitted for left total knee arthroplasty.  Subjective:  Chief Complaint:left knee pain.  HPI: Patricia Carrillo, 57 y.o. female, has a history of pain and functional disability in the left knee due to arthritis and has failed non-surgical conservative treatments for greater than 12 weeks to includeNSAID's and/or analgesics, corticosteriod injections, viscosupplementation injections, supervised PT with diminished ADL's post treatment, use of assistive devices, and activity modification.  Onset of symptoms was gradual, starting >10 years ago with gradually worsening course since that time. The patient noted prior procedures on the knee to include  menisectomy on the left knee(s).  Patient currently rates pain in the left knee(s) at 10 out of 10 with activity. Patient has night pain, worsening of pain with activity and weight bearing, pain that interferes with activities of daily living, and pain with passive range of motion.  Patient has evidence of periarticular osteophytes and joint space narrowing by imaging studies. There is no active infection.  Patient Active Problem List   Diagnosis Date Noted   Lumbar degenerative disc disease 11/30/2021   Impingement syndrome, shoulder, left 03/14/2021   Primary osteoarthritis of both knees 03/14/2021   Past Medical History:  Diagnosis Date   Anxiety    Chronic kidney disease    "stage 3" per pt   Fatigue    Headache    Hypertension    Obesity 01/16/2022   takes metformin and ozempic "for weight loss, not diabetic"   Sleep apnea    dx 15 years ago, never used CPAP    Past Surgical History:  Procedure Laterality Date   CHOLECYSTECTOMY     LAPAROSCOPIC HYSTERECTOMY     REVERSE SHOULDER ARTHROPLASTY Left 08/17/2022   Procedure: REVERSE SHOULDER ARTHROPLASTY;  Surgeon: Varkey, Dax T, MD;  Location: [facility];  Service: Orthopedics;  Laterality: Left;   SHOULDER ARTHROSCOPY WITH DISTAL  CLAVICLE RESECTION Left 01/26/2022   Procedure: SHOULDER ARTHROSCOPY WITH DISTAL CLAVICLE EXCISION/ DEBRIDEMENT;  Surgeon: Varkey, Dax T, MD;  Location: [facility];  Service: Orthopedics;  Laterality: Left;   SHOULDER ARTHROSCOPY WITH SUBACROMIAL DECOMPRESSION, ROTATOR CUFF REPAIR AND BICEP TENDON REPAIR Left 01/26/2022   Procedure: SHOULDER ARTHROSCOPY WITH SUBACROMIAL DECOMPRESSION, ROTATOR CUFF REPAIR;  Surgeon: Varkey, Dax T, MD;  Location: [facility];  Service: Orthopedics;  Laterality: Left;   TONSILLECTOMY     TUBAL LIGATION      Current Outpatient Medications  Medication Sig Dispense Refill Last Dose   gabapentin (NEURONTIN) 800 MG tablet Take 1-2 tablets (800-1,600 mg total) by mouth 2 (two) times daily. 360 tablet 3    lisinopril-hydrochlorothiazide (ZESTORETIC) 20-12.5 MG tablet Take 1 tablet by mouth 2 (two) times daily.      lovastatin (MEVACOR) 20 MG tablet Take 20 mg by mouth daily.      Misc. Devices (QUAD CANE) MISC Four post quad cane 1 each 0    Misc. Devices (ROLLING WALKER/BURGUNDY) MISC Rolling walker for daily use 1 each 0    OXYCODONE HCL PO Take by mouth.      Semaglutide (OZEMPIC, 0.25 OR 0.5 MG/DOSE, [place]) Inject into the skin.      tiZANidine (ZANAFLEX) 4 MG tablet Take 4 mg by mouth 3 (three) times daily.      No current facility-administered medications for this visit.   No Known Allergies  Social History   Tobacco Use   Smoking status: Never   Smokeless tobacco: Never  Substance Use Topics     Alcohol use: Not Currently    No family history on file.   Review of Systems  Musculoskeletal:  Positive for arthralgias.    Objective:  Physical Exam Constitutional:      General: She is not in acute distress.    Appearance: Normal appearance. She is not ill-appearing.  HENT:     Head: Normocephalic and atraumatic.     Right Ear: External ear normal.     Left Ear: External ear normal.     Nose: Nose normal.     Mouth/Throat:      Mouth: Mucous membranes are moist.     Pharynx: Oropharynx is clear.  Eyes:     Extraocular Movements: Extraocular movements intact.     Conjunctiva/sclera: Conjunctivae normal.  Cardiovascular:     Rate and Rhythm: Normal rate and regular rhythm.     Pulses: Normal pulses.     Heart sounds: Normal heart sounds.  Pulmonary:     Effort: Pulmonary effort is normal.     Breath sounds: Normal breath sounds.  Abdominal:     General: Bowel sounds are normal.     Palpations: Abdomen is soft.     Tenderness: There is no abdominal tenderness.  Musculoskeletal:        General: Tenderness present.     Cervical back: Normal range of motion and neck supple.     Comments: Tenderness to medial aspect of left knee.  Reduced ROM in all directions due to pain.  BLE appear grossly neurovascularly intact.  Decreased strength of LLE.  No lesions or erythema appreciated on area of chief complaint.  Gait appears antalgic.    Skin:    General: Skin is warm and dry.  Neurological:     Mental Status: She is alert and oriented to person, place, and time. Mental status is at baseline.  Psychiatric:        Mood and Affect: Mood normal.        Behavior: Behavior normal.     Vital signs in last 24 hours: @VSRANGES@  Labs:   Estimated body mass index is 43.41 kg/m as calculated from the following:   Height as of 08/17/22: 5' 1" (1.549 m).   Weight as of 08/17/22: 104.2 kg.   Imaging Review Plain radiographs demonstrate severe degenerative joint disease of the left knee(s). The overall alignment ismild varus. The bone quality appears to be good for age and reported activity level.      Assessment/Plan:  End stage arthritis, left knee   The patient history, physical examination, clinical judgment of the provider and imaging studies are consistent with end stage degenerative joint disease of the left knee(s) and total knee arthroplasty is deemed medically necessary. The treatment options  including medical management, injection therapy arthroscopy and arthroplasty were discussed at length. The risks and benefits of total knee arthroplasty were presented and reviewed. The risks due to aseptic loosening, infection, stiffness, patella tracking problems, thromboembolic complications and other imponderables were discussed. The patient acknowledged the explanation, agreed to proceed with the plan and consent was signed. Patient is being admitted for inpatient treatment for surgery, pain control, PT, OT, prophylactic antibiotics, VTE prophylaxis, progressive ambulation and ADL's and discharge planning. The patient is planning to be discharged home with outpatient PT   

## 2023-01-31 NOTE — Progress Notes (Addendum)
Anesthesia Review:  PCP: Johnny Bridge white- LOV 11/20/22 clearance on chart dated 11/30/22 on chart  Cardiologist :none   Kidney followed by [facility]  Chest x-ray : EKG : 08/14/22  Ct cors- 03/20/22  Echo : Stress test: Cardiac Cath :  Activity level:  Sleep Study/ CPAP : pt diagnosed in 2003 with sleep apnea pt could not afford copay could not get cpap machine.  In 2023 pt stated she asked to be retested and insurance refused to pay for sleep study  Fasting Blood Sugar :      / Checks Blood Sugar -- times a day:   Blood Thinner/ Instructions /Last Dose: ASA / Instructions/ Last Dose :   Prediabetes - " once in a while" checks glucose at home  Hgba1c- 02/02/23-   Ozempic- Last dose on 02/03/23  Metformin- none day of surgery

## 2023-02-01 ENCOUNTER — Ambulatory Visit (HOSPITAL_COMMUNITY): Payer: Self-pay | Admitting: Emergency Medicine

## 2023-02-01 ENCOUNTER — Other Ambulatory Visit (HOSPITAL_COMMUNITY): Payer: Self-pay | Admitting: Emergency Medicine

## 2023-02-01 DIAGNOSIS — G8929 Other chronic pain: Secondary | ICD-10-CM

## 2023-02-01 NOTE — Patient Instructions (Signed)
SURGICAL WAITING ROOM VISITATION  Patients having surgery or a procedure may have no more than 2 support people in the waiting area - these visitors may rotate.    Children under the age of 49 must have an adult with them who is not the patient.  Due to an increase in RSV and influenza rates and associated hospitalizations, children ages 42 and under may not visit patients in [facility] hospitals.  If the patient needs to stay at the hospital during part of their recovery, the visitor guidelines for inpatient rooms apply. Pre-op nurse will coordinate an appropriate time for 1 support person to accompany patient in pre-op.  This support person may not rotate.    Please refer to the [facility] website for the visitor guidelines for Inpatients (after your surgery is over and you are in a regular room).       Your procedure is scheduled on:  02/14/2023    Report to [facility] Main Entrance    Report to admitting at   0600AM   Call this number if you have problems the morning of surgery [phone]   Do not eat food :After Midnight.   After Midnight you may have the following liquids until  0530______ AM DAY OF SURGERY  Water Non-Citrus Juices (without pulp, NO RED-Apple, White grape, White cranberry) Black Coffee (NO MILK/CREAM OR CREAMERS, sugar ok)  Clear Tea (NO MILK/CREAM OR CREAMERS, sugar ok) regular and decaf                             Plain Jell-O (NO RED)                                           Fruit ices (not with fruit pulp, NO RED)                                     Popsicles (NO RED)                                                               Sports drinks like Gatorade (NO RED)                    The day of surgery:  Drink ONE (1) Pre-Surgery Clear Ensure or G2 at  0530 AM ( have completed by )  the morning of surgery. Drink in one sitting. Do not sip.  This drink was given to you during your hospital  pre-op appointment visit. Nothing else to  drink after completing the  Pre-Surgery Clear Ensure or G2.          If you have questions, please contact your surgeon's office.        Oral Hygiene is also important to reduce your risk of infection.                                    Remember - BRUSH YOUR TEETH THE MORNING OF SURGERY  WITH YOUR REGULAR TOOTHPASTE  DENTURES WILL BE REMOVED PRIOR TO SURGERY PLEASE DO NOT APPLY "Poly grip" OR ADHESIVES!!!   Do NOT smoke after Midnight   Take these medicines the morning of surgery with A SIP OF WATER:  gabapentin   DO NOT TAKE ANY ORAL DIABETIC MEDICATIONS DAY OF YOUR SURGERY  Bring CPAP mask and tubing day of surgery.                              You may not have any metal on your body including hair pins, jewelry, and body piercing             Do not wear make-up, lotions, powders, perfumes/cologne, or deodorant  Do not wear nail polish including gel and S&S, artificial/acrylic nails, or any other type of covering on natural nails including finger and toenails. If you have artificial nails, gel coating, etc. that needs to be removed by a nail salon please have this removed prior to surgery or surgery may need to be canceled/ delayed if the surgeon/ anesthesia feels like they are unable to be safely monitored.   Do not shave  48 hours prior to surgery.               Men may shave face and neck.   Do not bring valuables to the hospital. [facility] IS NOT             RESPONSIBLE   FOR VALUABLES.   Contacts, glasses, dentures or bridgework may not be worn into surgery.   Bring small overnight bag day of surgery.   DO NOT BRING YOUR HOME MEDICATIONS TO THE HOSPITAL. PHARMACY WILL DISPENSE MEDICATIONS LISTED ON YOUR MEDICATION LIST TO YOU DURING YOUR ADMISSION IN THE HOSPITAL!    Patients discharged on the day of surgery will not be allowed to drive home.  Someone NEEDS to stay with you for the first 24 hours after anesthesia.   Special Instructions: Bring a copy of your  healthcare power of attorney and living will documents the day of surgery if you haven't scanned them before.              Please read over the following fact sheets you were given: IF YOU HAVE QUESTIONS ABOUT YOUR PRE-OP INSTRUCTIONS PLEASE CALL [phone]   If you received a COVID test during your pre-op visit  it is requested that you wear a mask when out in public, stay away from anyone that may not be feeling well and notify your surgeon if you develop symptoms. If you test positive for Covid or have been in contact with anyone that has tested positive in the last 10 days please notify you surgeon.

## 2023-02-02 ENCOUNTER — Encounter (HOSPITAL_COMMUNITY): Payer: Self-pay

## 2023-02-02 ENCOUNTER — Encounter (HOSPITAL_COMMUNITY)
Admission: RE | Admit: 2023-02-02 | Discharge: 2023-02-02 | Disposition: A | Discharge status: Home / Self Care | Payer: Medicaid Other | Source: Ambulatory Visit | Attending: Orthopedic Surgery | Admitting: Orthopedic Surgery

## 2023-02-02 ENCOUNTER — Other Ambulatory Visit: Payer: Self-pay

## 2023-02-02 VITALS — BP 108/65 | HR 84 | Temp 98.6°F | Resp 16 | Ht 61.0 in | Wt 192.0 lb

## 2023-02-02 DIAGNOSIS — Z01818 Encounter for other preprocedural examination: Secondary | ICD-10-CM

## 2023-02-02 DIAGNOSIS — M25562 Pain in left knee: Secondary | ICD-10-CM | POA: Diagnosis not present

## 2023-02-02 DIAGNOSIS — Z01812 Encounter for preprocedural laboratory examination: Secondary | ICD-10-CM | POA: Diagnosis present

## 2023-02-02 DIAGNOSIS — G8929 Other chronic pain: Secondary | ICD-10-CM

## 2023-02-02 HISTORY — DX: Personal history of urinary calculi: Z87.442

## 2023-02-02 HISTORY — DX: Prediabetes: R73.03

## 2023-02-02 HISTORY — DX: Unspecified osteoarthritis, unspecified site: M19.90

## 2023-02-02 LAB — COMPREHENSIVE METABOLIC PANEL
ALT: 13 U/L (ref 0–44)
AST: 14 U/L — ABNORMAL LOW (ref 15–41)
Albumin: 3.8 g/dL (ref 3.5–5.0)
Alkaline Phosphatase: 50 U/L (ref 38–126)
Anion gap: 9 (ref 5–15)
BUN: 20 mg/dL (ref 6–20)
CO2: 26 mmol/L (ref 22–32)
Calcium: 8.9 mg/dL (ref 8.9–10.3)
Chloride: 103 mmol/L (ref 98–111)
Creatinine, Ser: 1.06 mg/dL — ABNORMAL HIGH (ref 0.44–1.00)
GFR, Estimated: >60 mL/min (ref >60)
Glucose, Bld: 106 mg/dL — ABNORMAL HIGH (ref 70–99)
Potassium: 3.9 mmol/L (ref 3.5–5.1)
Sodium: 138 mmol/L (ref 135–145)
Total Bilirubin: 0.3 mg/dL (ref 0.3–1.2)
Total Protein: 7 g/dL (ref 6.5–8.1)

## 2023-02-02 LAB — CBC WITH DIFFERENTIAL/PLATELET
Abs Immature Granulocytes: 0.03 10*3/uL (ref 0.00–0.07)
Basophils Absolute: 0.1 10*3/uL (ref 0.0–0.1)
Basophils Relative: 1 %
Eosinophils Absolute: 0.3 10*3/uL (ref 0.0–0.5)
Eosinophils Relative: 4 %
HCT: 37.7 % (ref 36.0–46.0)
Hemoglobin: 11.9 g/dL — ABNORMAL LOW (ref 12.0–15.0)
Immature Granulocytes: 0 %
Lymphocytes Relative: 41 %
Lymphs Abs: 3.8 10*3/uL (ref 0.7–4.0)
MCH: 28.5 pg (ref 26.0–34.0)
MCHC: 31.6 g/dL (ref 30.0–36.0)
MCV: 90.2 fL (ref 80.0–100.0)
Monocytes Absolute: 0.6 10*3/uL (ref 0.1–1.0)
Monocytes Relative: 7 %
Neutro Abs: 4.5 10*3/uL (ref 1.7–7.7)
Neutrophils Relative %: 47 %
Platelets: 340 10*3/uL (ref 150–400)
RBC: 4.18 MIL/uL (ref 3.87–5.11)
RDW: 13.2 % (ref 11.5–15.5)
WBC: 9.4 10*3/uL (ref 4.0–10.5)
nRBC: 0 % (ref 0.0–0.2)

## 2023-02-02 LAB — GLUCOSE, CAPILLARY: Glucose-Capillary: 98 mg/dL (ref 70–99)

## 2023-02-02 LAB — TYPE AND SCREEN
ABO/RH(D): A POS
Antibody Screen: NEGATIVE

## 2023-02-02 LAB — HEMOGLOBIN A1C
Hgb A1c MFr Bld: 5.4 % (ref 4.8–5.6)
Mean Plasma Glucose: 108.28 mg/dL

## 2023-02-08 ENCOUNTER — Ambulatory Visit: Payer: Medicaid Other | Admitting: Sports Medicine

## 2023-02-08 ENCOUNTER — Ambulatory Visit (INDEPENDENT_AMBULATORY_CARE_PROVIDER_SITE_OTHER): Payer: Medicaid Other

## 2023-02-08 DIAGNOSIS — G8929 Other chronic pain: Secondary | ICD-10-CM

## 2023-02-08 DIAGNOSIS — M25511 Pain in right shoulder: Secondary | ICD-10-CM | POA: Diagnosis not present

## 2023-02-08 DIAGNOSIS — Z96612 Presence of left artificial shoulder joint: Secondary | ICD-10-CM | POA: Diagnosis not present

## 2023-02-08 NOTE — Assessment & Plan Note (Signed)
Approximately 5 months post left total shoulder arthroplasty with Dr. Everardo Pacific, doing a lot better.  Still has a bit of achiness. She will discuss this in further detail with her surgeon but I do think she simply needs to give this more time.

## 2023-02-08 NOTE — Progress Notes (Signed)
    Procedures performed today:    None.  Independent interpretation of notes and tests performed by another provider:   None.  Brief History, Exam, Impression, and Recommendations:    Status post total shoulder arthroplasty, left Approximately 5 months post left total shoulder arthroplasty with Dr. Everardo Pacific, doing a lot better.  Still has a bit of achiness. She will discuss this in further detail with her surgeon but I do think she simply needs to give this more time.  Chronic right shoulder pain Chronic right shoulder pain localized over the deltoid worse at night, unclear etiology as her exam is for the most part normal with only a minimally positive empty can sign today. We will add her home condition, x-rays, as she has significantly nocturnal symptoms we will double her Neurontin at night. She does have enough and does not need a refill, she also gets her narcotics for her pain management provider. Return to see me in 4 to 6 weeks, will consider injections if not better.    ____________________________________________ Ihor Austin. Benjamin Stain, M.D., ABFM., CAQSM., AME. Primary Care and Sports Medicine [facility]  Adjunct Professor of Family Medicine  [employer]  Restaurant manager, fast food

## 2023-02-08 NOTE — Assessment & Plan Note (Signed)
Chronic right shoulder pain localized over the deltoid worse at night, unclear etiology as her exam is for the most part normal with only a minimally positive empty can sign today. We will add her home condition, x-rays, as she has significantly nocturnal symptoms we will double her Neurontin at night. She does have enough and does not need a refill, she also gets her narcotics for her pain management provider. Return to see me in 4 to 6 weeks, will consider injections if not better.

## 2023-02-13 NOTE — Anesthesia Preprocedure Evaluation (Addendum)
Anesthesia Evaluation  Patient identified by MRN, date of birth, ID band Patient awake    Reviewed: Allergy & Precautions, NPO status , Patient's Chart, lab work & pertinent test results  History of Anesthesia Complications Negative for: history of anesthetic complications  Airway Mallampati: III  TM Distance: >3 FB Neck ROM: Full    Dental  (+) Dental Advisory Given, Partial Upper, Caps   Pulmonary sleep apnea , former smoker   Pulmonary exam normal        Cardiovascular hypertension, Pt. on medications Normal cardiovascular exam     Neuro/Psych  Headaches PSYCHIATRIC DISORDERS Anxiety        GI/Hepatic Neg liver ROS,GERD  Medicated and Controlled,,  Endo/Other   Obesity Pre-DM   Renal/GU CRFRenal disease     Musculoskeletal  (+) Arthritis ,    Abdominal   Peds  Hematology  (+) Blood dyscrasia, anemia  Plt 340k    Anesthesia Other Findings On GLP-1a, last dose over 1 week ago   Reproductive/Obstetrics  s/p hysterectomy                              Anesthesia Physical Anesthesia Plan  ASA: 3  Anesthesia Plan: Spinal   Post-op Pain Management: Tylenol PO (pre-op)* and Regional block*   Induction:   PONV Risk Score and Plan: 2 and Treatment may vary due to age or medical condition and Propofol infusion  Airway Management Planned: Natural Airway and Simple Face Mask  Additional Equipment: None  Intra-op Plan:   Post-operative Plan:   Informed Consent: I have reviewed the patients History and Physical, chart, labs and discussed the procedure including the risks, benefits and alternatives for the proposed anesthesia with the patient or authorized representative who has indicated his/her understanding and acceptance.       Plan Discussed with: CRNA and Anesthesiologist  Anesthesia Plan Comments: (Labs reviewed, platelets acceptable. Discussed risks and benefits of  spinal, including spinal/epidural hematoma, infection, failed block, and PDPH. Patient expressed understanding and wished to proceed. )       Anesthesia Quick Evaluation

## 2023-02-14 ENCOUNTER — Other Ambulatory Visit: Payer: Self-pay

## 2023-02-14 ENCOUNTER — Ambulatory Visit (HOSPITAL_BASED_OUTPATIENT_CLINIC_OR_DEPARTMENT_OTHER): Payer: Medicaid Other | Admitting: Anesthesiology

## 2023-02-14 ENCOUNTER — Ambulatory Visit (HOSPITAL_COMMUNITY)
Admission: RE | Admit: 2023-02-14 | Discharge: 2023-02-14 | Disposition: A | Discharge status: Home / Self Care | Payer: Medicaid Other | Source: Ambulatory Visit | Attending: Orthopedic Surgery | Admitting: Orthopedic Surgery

## 2023-02-14 ENCOUNTER — Encounter (HOSPITAL_COMMUNITY)
Admission: RE | Disposition: A | Discharge status: Home / Self Care | Payer: Self-pay | Source: Ambulatory Visit | Attending: Orthopedic Surgery

## 2023-02-14 ENCOUNTER — Encounter (HOSPITAL_COMMUNITY): Payer: Self-pay | Admitting: Orthopedic Surgery

## 2023-02-14 ENCOUNTER — Ambulatory Visit (HOSPITAL_COMMUNITY): Payer: Medicaid Other | Admitting: Physician Assistant

## 2023-02-14 ENCOUNTER — Ambulatory Visit (HOSPITAL_COMMUNITY): Payer: Medicaid Other

## 2023-02-14 DIAGNOSIS — Z6836 Body mass index (BMI) 36.0-36.9, adult: Secondary | ICD-10-CM | POA: Diagnosis not present

## 2023-02-14 DIAGNOSIS — Z87891 Personal history of nicotine dependence: Secondary | ICD-10-CM | POA: Insufficient documentation

## 2023-02-14 DIAGNOSIS — E669 Obesity, unspecified: Secondary | ICD-10-CM | POA: Insufficient documentation

## 2023-02-14 DIAGNOSIS — N189 Chronic kidney disease, unspecified: Secondary | ICD-10-CM

## 2023-02-14 DIAGNOSIS — K219 Gastro-esophageal reflux disease without esophagitis: Secondary | ICD-10-CM | POA: Insufficient documentation

## 2023-02-14 DIAGNOSIS — Z7985 Long-term (current) use of injectable non-insulin antidiabetic drugs: Secondary | ICD-10-CM | POA: Insufficient documentation

## 2023-02-14 DIAGNOSIS — D631 Anemia in chronic kidney disease: Secondary | ICD-10-CM

## 2023-02-14 DIAGNOSIS — G4730 Sleep apnea, unspecified: Secondary | ICD-10-CM | POA: Insufficient documentation

## 2023-02-14 DIAGNOSIS — M1712 Unilateral primary osteoarthritis, left knee: Secondary | ICD-10-CM | POA: Insufficient documentation

## 2023-02-14 DIAGNOSIS — I129 Hypertensive chronic kidney disease with stage 1 through stage 4 chronic kidney disease, or unspecified chronic kidney disease: Secondary | ICD-10-CM | POA: Insufficient documentation

## 2023-02-14 DIAGNOSIS — Z79899 Other long term (current) drug therapy: Secondary | ICD-10-CM | POA: Insufficient documentation

## 2023-02-14 DIAGNOSIS — R7303 Prediabetes: Secondary | ICD-10-CM | POA: Diagnosis not present

## 2023-02-14 DIAGNOSIS — G8929 Other chronic pain: Secondary | ICD-10-CM

## 2023-02-14 HISTORY — PX: TOTAL KNEE ARTHROPLASTY: SHX125

## 2023-02-14 LAB — GLUCOSE, CAPILLARY
Glucose-Capillary: 68 mg/dL — ABNORMAL LOW (ref 70–99)
Glucose-Capillary: 94 mg/dL (ref 70–99)
Glucose-Capillary: 77 mg/dL (ref 70–99)
Glucose-Capillary: 103 mg/dL — ABNORMAL HIGH (ref 70–99)

## 2023-02-14 LAB — ABO/RH: ABO/RH(D): A POS

## 2023-02-14 SURGERY — ARTHROPLASTY, KNEE, TOTAL
Anesthesia: Spinal | Site: Knee | Laterality: Left

## 2023-02-14 MED ORDER — CELECOXIB 100 MG PO CAPS: 100.0000 mg | ORAL_CAPSULE | Freq: Two times a day (BID) | ORAL | 0 refills | Status: AC

## 2023-02-14 MED ORDER — BUPIVACAINE IN DEXTROSE 0.75-8.25 % IT SOLN
INTRATHECAL | Status: DC | PRN
  Administered 2023-02-14: 1.8 mL via INTRATHECAL

## 2023-02-14 MED ORDER — PHENYLEPHRINE HCL (PRESSORS) 10 MG/ML IV SOLN
INTRAVENOUS | Status: AC
  Filled 2023-02-14: qty 1

## 2023-02-14 MED ORDER — ONDANSETRON HCL 4 MG/2ML IJ SOLN: 4.0000 mg | Freq: Four times a day (QID) | INTRAMUSCULAR | Status: DC | PRN

## 2023-02-14 MED ORDER — ONDANSETRON HCL 4 MG/2ML IJ SOLN
INTRAMUSCULAR | Status: AC
  Filled 2023-02-14: qty 2

## 2023-02-14 MED ORDER — OXYCODONE HCL 5 MG PO TABS
5.0000 mg | ORAL_TABLET | Freq: Once | ORAL | Status: AC | PRN
  Administered 2023-02-14: 5 mg via ORAL

## 2023-02-14 MED ORDER — FENTANYL CITRATE PF 50 MCG/ML IJ SOSY
PREFILLED_SYRINGE | INTRAMUSCULAR | Status: AC
  Administered 2023-02-14: 50 ug via INTRAVENOUS
  Filled 2023-02-14: qty 2

## 2023-02-14 MED ORDER — WATER FOR IRRIGATION, STERILE IR SOLN
Status: DC | PRN
  Administered 2023-02-14 (×2): 1000 mL

## 2023-02-14 MED ORDER — DEXTROSE 50 % IV SOLN
INTRAVENOUS | Status: AC
  Administered 2023-02-14: 25 mL via INTRAVENOUS
  Filled 2023-02-14: qty 50

## 2023-02-14 MED ORDER — FENTANYL CITRATE (PF) 100 MCG/2ML IJ SOLN
INTRAMUSCULAR | Status: DC | PRN
  Administered 2023-02-14 (×2): 50 ug via INTRAVENOUS

## 2023-02-14 MED ORDER — ACETAMINOPHEN 500 MG PO TABS
1000.0000 mg | ORAL_TABLET | Freq: Four times a day (QID) | ORAL | Status: DC
  Administered 2023-02-14: 1000 mg via ORAL

## 2023-02-14 MED ORDER — FENTANYL CITRATE PF 50 MCG/ML IJ SOSY
PREFILLED_SYRINGE | INTRAMUSCULAR | Status: AC
  Administered 2023-02-14: 25 ug via INTRAVENOUS
  Filled 2023-02-14: qty 1

## 2023-02-14 MED ORDER — CHLORHEXIDINE GLUCONATE 0.12 % MT SOLN
15.0000 mL | Freq: Once | OROMUCOSAL | Status: AC
  Administered 2023-02-14: 15 mL via OROMUCOSAL

## 2023-02-14 MED ORDER — ONDANSETRON HCL 4 MG PO TABS: 4.0000 mg | ORAL_TABLET | Freq: Four times a day (QID) | ORAL | Status: DC | PRN

## 2023-02-14 MED ORDER — ROPIVACAINE HCL 7.5 MG/ML IJ SOLN
INTRAMUSCULAR | Status: DC | PRN
  Administered 2023-02-14: 20 mL via PERINEURAL

## 2023-02-14 MED ORDER — BUPIVACAINE LIPOSOME 1.3 % IJ SUSP
INTRAMUSCULAR | Status: DC | PRN
  Administered 2023-02-14: 20 mL

## 2023-02-14 MED ORDER — CEFAZOLIN SODIUM-DEXTROSE 2-4 GM/100ML-% IV SOLN
2.0000 g | INTRAVENOUS | Status: AC
  Administered 2023-02-14: 2 g via INTRAVENOUS
  Filled 2023-02-14: qty 100

## 2023-02-14 MED ORDER — DEXAMETHASONE SODIUM PHOSPHATE 10 MG/ML IJ SOLN: 8.0000 mg | Freq: Once | INTRAMUSCULAR | Status: DC

## 2023-02-14 MED ORDER — PROPOFOL 1000 MG/100ML IV EMUL
INTRAVENOUS | Status: AC
  Filled 2023-02-14: qty 100

## 2023-02-14 MED ORDER — LIDOCAINE HCL (PF) 2 % IJ SOLN
INTRAMUSCULAR | Status: AC
  Filled 2023-02-14: qty 5

## 2023-02-14 MED ORDER — BUPIVACAINE LIPOSOME 1.3 % IJ SUSP: 20.0000 mL | Freq: Once | INTRAMUSCULAR | Status: DC

## 2023-02-14 MED ORDER — TRANEXAMIC ACID-NACL 1000-0.7 MG/100ML-% IV SOLN
1000.0000 mg | INTRAVENOUS | Status: AC
  Administered 2023-02-14: 1000 mg via INTRAVENOUS
  Filled 2023-02-14: qty 100

## 2023-02-14 MED ORDER — KETOROLAC TROMETHAMINE 15 MG/ML IJ SOLN: 15.0000 mg | Freq: Once | INTRAMUSCULAR | Status: AC | PRN

## 2023-02-14 MED ORDER — SODIUM CHLORIDE 0.9 % IR SOLN
Status: DC | PRN
  Administered 2023-02-14: 3000 mL
  Administered 2023-02-14: 1000 mL

## 2023-02-14 MED ORDER — MIDAZOLAM HCL 2 MG/2ML IJ SOLN
INTRAMUSCULAR | Status: AC
  Filled 2023-02-14: qty 2

## 2023-02-14 MED ORDER — METHOCARBAMOL 500 MG PO TABS
ORAL_TABLET | ORAL | Status: AC
  Administered 2023-02-14: 500 mg via ORAL
  Filled 2023-02-14: qty 1

## 2023-02-14 MED ORDER — POVIDONE-IODINE 10 % EX SWAB: 2.0000 | Freq: Once | CUTANEOUS | Status: DC

## 2023-02-14 MED ORDER — ACETAMINOPHEN 500 MG PO TABS: 1000.0000 mg | ORAL_TABLET | Freq: Three times a day (TID) | ORAL | 0 refills | Status: AC | PRN

## 2023-02-14 MED ORDER — PROMETHAZINE HCL 25 MG/ML IJ SOLN: 6.2500 mg | INTRAMUSCULAR | Status: DC | PRN

## 2023-02-14 MED ORDER — KETOROLAC TROMETHAMINE 15 MG/ML IJ SOLN: 15.0000 mg | Freq: Four times a day (QID) | INTRAMUSCULAR | Status: DC

## 2023-02-14 MED ORDER — BUPIVACAINE LIPOSOME 1.3 % IJ SUSP
INTRAMUSCULAR | Status: AC
  Filled 2023-02-14: qty 20

## 2023-02-14 MED ORDER — ACETAMINOPHEN 500 MG PO TABS: 1000.0000 mg | ORAL_TABLET | Freq: Once | ORAL | Status: DC

## 2023-02-14 MED ORDER — METHOCARBAMOL 500 MG PO TABS: 500.0000 mg | ORAL_TABLET | Freq: Four times a day (QID) | ORAL | Status: DC | PRN

## 2023-02-14 MED ORDER — CEFAZOLIN SODIUM-DEXTROSE 2-4 GM/100ML-% IV SOLN: 2.0000 g | Freq: Four times a day (QID) | INTRAVENOUS | Status: DC

## 2023-02-14 MED ORDER — OXYCODONE HCL 5 MG PO TABS
10.0000 mg | ORAL_TABLET | ORAL | Status: DC | PRN
  Administered 2023-02-14: 10 mg via ORAL

## 2023-02-14 MED ORDER — FENTANYL CITRATE (PF) 100 MCG/2ML IJ SOLN
INTRAMUSCULAR | Status: AC
  Filled 2023-02-14: qty 2

## 2023-02-14 MED ORDER — SODIUM CHLORIDE 0.9 % IV SOLN: INTRAVENOUS | Status: DC

## 2023-02-14 MED ORDER — ACETAMINOPHEN 500 MG PO TABS
1000.0000 mg | ORAL_TABLET | Freq: Once | ORAL | Status: AC
  Administered 2023-02-14: 1000 mg via ORAL
  Filled 2023-02-14: qty 2

## 2023-02-14 MED ORDER — 0.9 % SODIUM CHLORIDE (POUR BTL) OPTIME
TOPICAL | Status: DC | PRN
  Administered 2023-02-14: 1000 mL

## 2023-02-14 MED ORDER — PROPOFOL 500 MG/50ML IV EMUL
INTRAVENOUS | Status: DC | PRN
  Administered 2023-02-14: 50 ug/kg/min via INTRAVENOUS

## 2023-02-14 MED ORDER — HYDROMORPHONE HCL 1 MG/ML IJ SOLN: 0.5000 mg | INTRAMUSCULAR | Status: DC | PRN

## 2023-02-14 MED ORDER — OXYCODONE HCL 5 MG/5ML PO SOLN: 5.0000 mg | Freq: Once | ORAL | Status: AC | PRN

## 2023-02-14 MED ORDER — FENTANYL CITRATE PF 50 MCG/ML IJ SOSY
25.0000 ug | PREFILLED_SYRINGE | INTRAMUSCULAR | Status: DC | PRN
  Administered 2023-02-14: 50 ug via INTRAVENOUS
  Administered 2023-02-14: 25 ug via INTRAVENOUS

## 2023-02-14 MED ORDER — PHENYLEPHRINE HCL-NACL 20-0.9 MG/250ML-% IV SOLN
INTRAVENOUS | Status: DC | PRN
  Administered 2023-02-14: 50 ug/min via INTRAVENOUS

## 2023-02-14 MED ORDER — HYDROMORPHONE HCL 1 MG/ML IJ SOLN
INTRAMUSCULAR | Status: AC
  Administered 2023-02-14: 0.5 mg via INTRAVENOUS
  Filled 2023-02-14: qty 2

## 2023-02-14 MED ORDER — METHOCARBAMOL 500 MG IVPB - SIMPLE MED
500.0000 mg | Freq: Four times a day (QID) | INTRAVENOUS | Status: DC | PRN
  Filled 2023-02-14: qty 55

## 2023-02-14 MED ORDER — PROPOFOL 10 MG/ML IV BOLUS
INTRAVENOUS | Status: DC | PRN
  Administered 2023-02-14: 20 mg via INTRAVENOUS
  Administered 2023-02-14: 10 mg via INTRAVENOUS
  Administered 2023-02-14: 20 mg via INTRAVENOUS

## 2023-02-14 MED ORDER — LACTATED RINGERS IV SOLN: INTRAVENOUS | Status: DC

## 2023-02-14 MED ORDER — ONDANSETRON HCL 4 MG PO TABS: 4.0000 mg | ORAL_TABLET | Freq: Three times a day (TID) | ORAL | 0 refills | Status: AC | PRN

## 2023-02-14 MED ORDER — MIDAZOLAM HCL 5 MG/5ML IJ SOLN
INTRAMUSCULAR | Status: DC | PRN
  Administered 2023-02-14 (×2): 1 mg via INTRAVENOUS

## 2023-02-14 MED ORDER — SODIUM CHLORIDE 0.9% FLUSH
INTRAVENOUS | Status: DC | PRN
  Administered 2023-02-14: 50 mL
  Administered 2023-02-14: 10 mL

## 2023-02-14 MED ORDER — SODIUM CHLORIDE (PF) 0.9 % IJ SOLN
INTRAMUSCULAR | Status: AC
  Filled 2023-02-14: qty 50

## 2023-02-14 MED ORDER — LACTATED RINGERS IV BOLUS: 250.0000 mL | Freq: Once | INTRAVENOUS | Status: DC

## 2023-02-14 MED ORDER — DEXAMETHASONE SODIUM PHOSPHATE 10 MG/ML IJ SOLN
INTRAMUSCULAR | Status: AC
  Filled 2023-02-14: qty 1

## 2023-02-14 MED ORDER — DEXTROSE 50 % IV SOLN: 25.0000 mL | Freq: Once | INTRAVENOUS | Status: AC

## 2023-02-14 MED ORDER — HYDROMORPHONE HCL 1 MG/ML IJ SOLN
0.2500 mg | INTRAMUSCULAR | Status: DC | PRN
  Administered 2023-02-14 (×3): 0.5 mg via INTRAVENOUS

## 2023-02-14 MED ORDER — ONDANSETRON HCL 4 MG/2ML IJ SOLN
INTRAMUSCULAR | Status: DC | PRN
  Administered 2023-02-14: 4 mg via INTRAVENOUS

## 2023-02-14 MED ORDER — PROPOFOL 10 MG/ML IV BOLUS
INTRAVENOUS | Status: AC
  Filled 2023-02-14: qty 20

## 2023-02-14 MED ORDER — KETOROLAC TROMETHAMINE 15 MG/ML IJ SOLN
INTRAMUSCULAR | Status: AC
  Administered 2023-02-14: 15 mg via INTRAVENOUS
  Filled 2023-02-14: qty 1

## 2023-02-14 MED ORDER — DEXAMETHASONE SODIUM PHOSPHATE 10 MG/ML IJ SOLN
INTRAMUSCULAR | Status: DC | PRN
  Administered 2023-02-14: 10 mg via INTRAVENOUS

## 2023-02-14 MED ORDER — OXYCODONE HCL 5 MG PO TABS
ORAL_TABLET | ORAL | Status: AC
  Filled 2023-02-14: qty 2

## 2023-02-14 MED ORDER — SODIUM CHLORIDE (PF) 0.9 % IJ SOLN
INTRAMUSCULAR | Status: AC
  Filled 2023-02-14: qty 10

## 2023-02-14 MED ORDER — ASPIRIN 81 MG PO TBEC: 81.0000 mg | DELAYED_RELEASE_TABLET | Freq: Two times a day (BID) | ORAL | 0 refills | Status: AC

## 2023-02-14 MED ORDER — OXYCODONE HCL 5 MG PO TABS
ORAL_TABLET | ORAL | Status: AC
  Filled 2023-02-14: qty 1

## 2023-02-14 MED ORDER — LACTATED RINGERS IV BOLUS
500.0000 mL | Freq: Once | INTRAVENOUS | Status: AC
  Administered 2023-02-14: 500 mL via INTRAVENOUS

## 2023-02-14 MED ORDER — ACETAMINOPHEN 325 MG PO TABS: 325.0000 mg | ORAL_TABLET | Freq: Four times a day (QID) | ORAL | Status: DC | PRN

## 2023-02-14 MED ORDER — ACETAMINOPHEN 500 MG PO TABS
ORAL_TABLET | ORAL | Status: AC
  Filled 2023-02-14: qty 2

## 2023-02-14 SURGICAL SUPPLY — 65 items
ADH SKN CLS APL DERMABOND .7 (GAUZE/BANDAGES/DRESSINGS) ×1
APL PRP STRL LF DISP 70% ISPRP (MISCELLANEOUS) ×2
BAG COUNTER SPONGE SURGICOUNT (BAG) IMPLANT
BAG SPNG CNTER NS LX DISP (BAG)
BLADE SAG 18X100X1.27 (BLADE) ×1 IMPLANT
BLADE SAW SAG 35X64 .89 (BLADE) ×1 IMPLANT
BNDG CMPR 5X3 CHSV STRCH STRL (GAUZE/BANDAGES/DRESSINGS) ×1
BNDG CMPR MED 10X6 ELC LF (GAUZE/BANDAGES/DRESSINGS) ×1
BNDG COHESIVE 3X5 TAN ST LF (GAUZE/BANDAGES/DRESSINGS) ×1 IMPLANT
BNDG ELASTIC 6X10 VLCR STRL LF (GAUZE/BANDAGES/DRESSINGS) ×1 IMPLANT
BOWL SMART MIX CTS (DISPOSABLE) ×1 IMPLANT
CEMENT BONE R 1X40 (Cement) IMPLANT
CEMENT BONE REFOBACIN R1X40 US (Cement) IMPLANT
CHLORAPREP W/TINT 26 (MISCELLANEOUS) ×2 IMPLANT
COMP FEM PS KNEE NRW 7 LT (Joint) ×1 IMPLANT
COMP PATELLAR 10X35 METAL (Joint) ×1 IMPLANT
COMPONENT FEM PS KNEE NRW 7 LT (Joint) IMPLANT
COMPONENT PATELLAR 10X35 METAL (Joint) IMPLANT
COVER SURGICAL LIGHT HANDLE (MISCELLANEOUS) ×1 IMPLANT
CUFF TOURN SGL QUICK 34 (TOURNIQUET CUFF) ×1
CUFF TRNQT CYL 34X4.125X (TOURNIQUET CUFF) ×1 IMPLANT
DERMABOND ADVANCED .7 DNX12 (GAUZE/BANDAGES/DRESSINGS) ×1 IMPLANT
DRAPE INCISE IOBAN 85X60 (DRAPES) ×1 IMPLANT
DRAPE SHEET LG 3/4 BI-LAMINATE (DRAPES) ×1 IMPLANT
DRAPE U-SHAPE 47X51 STRL (DRAPES) ×1 IMPLANT
DRESSING AQUACEL AG SP 3.5X10 (GAUZE/BANDAGES/DRESSINGS) ×1 IMPLANT
DRSG AQUACEL AG ADV 3.5X10 (GAUZE/BANDAGES/DRESSINGS) IMPLANT
DRSG AQUACEL AG SP 3.5X10 (GAUZE/BANDAGES/DRESSINGS) ×1
ELECT REM PT RETURN 15FT ADLT (MISCELLANEOUS) ×1 IMPLANT
GAUZE SPONGE 4X4 12PLY STRL (GAUZE/BANDAGES/DRESSINGS) ×1 IMPLANT
GLOVE BIO SURGEON STRL SZ 6.5 (GLOVE) ×2 IMPLANT
GLOVE BIOGEL PI IND STRL 6.5 (GLOVE) ×1 IMPLANT
GLOVE BIOGEL PI IND STRL 8 (GLOVE) ×1 IMPLANT
GLOVE SURG ORTHO 8.0 STRL STRW (GLOVE) ×2 IMPLANT
GOWN STRL REUS W/ TWL XL LVL3 (GOWN DISPOSABLE) ×2 IMPLANT
GOWN STRL REUS W/TWL XL LVL3 (GOWN DISPOSABLE) ×2
HANDPIECE INTERPULSE COAX TIP (DISPOSABLE) ×1
HDLS TROCR DRIL PIN KNEE 75 (PIN) ×1
HOLDER FOLEY CATH W/STRAP (MISCELLANEOUS) ×1 IMPLANT
HOOD PEEL AWAY T7 (MISCELLANEOUS) ×3 IMPLANT
INSERT ARTISURF SZ 6-7 LT (Insert) IMPLANT
KIT TURNOVER KIT A (KITS) IMPLANT
MANIFOLD NEPTUNE II (INSTRUMENTS) ×1 IMPLANT
MARKER SKIN DUAL TIP RULER LAB (MISCELLANEOUS) ×1 IMPLANT
NS IRRIG 1000ML POUR BTL (IV SOLUTION) ×1 IMPLANT
PACK TOTAL KNEE CUSTOM (KITS) ×1 IMPLANT
PIN DRILL HDLS TROCAR 75 4PK (PIN) IMPLANT
SCREW HEADED 33MM KNEE (MISCELLANEOUS) IMPLANT
SET HNDPC FAN SPRY TIP SCT (DISPOSABLE) ×1 IMPLANT
SOLUTION IRRIG SURGIPHOR (IV SOLUTION) IMPLANT
SPIKE FLUID TRANSFER (MISCELLANEOUS) ×1 IMPLANT
STEM TIB PS KNEE D 0D LT (Stem) IMPLANT
STRIP CLOSURE SKIN 1/2X4 (GAUZE/BANDAGES/DRESSINGS) ×1 IMPLANT
SUT MNCRL AB 3-0 PS2 18 (SUTURE) ×1 IMPLANT
SUT STRATAFIX 0 PDS 27 VIOLET (SUTURE) ×1
SUT STRATAFIX PDO 1 14 VIOLET (SUTURE) ×1
SUT STRATFX PDO 1 14 VIOLET (SUTURE) ×1
SUT VIC AB 2-0 CT2 27 (SUTURE) ×2 IMPLANT
SUTURE STRATFX 0 PDS 27 VIOLET (SUTURE) ×1 IMPLANT
SUTURE STRATFX PDO 1 14 VIOLET (SUTURE) ×1 IMPLANT
SYR 50ML LL SCALE MARK (SYRINGE) ×1 IMPLANT
TRAY FOLEY MTR SLVR 14FR STAT (SET/KITS/TRAYS/PACK) IMPLANT
TUBE SUCTION HIGH CAP CLEAR NV (SUCTIONS) ×1 IMPLANT
UNDERPAD 30X36 HEAVY ABSORB (UNDERPADS AND DIAPERS) ×1 IMPLANT
WRAP KNEE MAXI GEL POST OP (GAUZE/BANDAGES/DRESSINGS) IMPLANT

## 2023-02-14 NOTE — Anesthesia Procedure Notes (Signed)
Anesthesia Regional Block: Adductor canal block   Pre-Anesthetic Checklist: , timeout performed,  Correct Patient, Correct Site, Correct Laterality,  Correct Procedure, Correct Position, site marked,  Risks and benefits discussed,  Surgical consent,  Pre-op evaluation,  At surgeon's request and post-op pain management  Laterality: Left  Prep: chloraprep       Needles:  Injection technique: Single-shot  Needle Type: Echogenic Needle     Needle Length: 10cm  Needle Gauge: 21     Additional Needles:   Narrative:  Start time: 02/14/2023 7:50 AM End time: 02/14/2023 7:53 AM Injection made incrementally with aspirations every 5 mL.  Performed by: Personally  Anesthesiologist: Beryle Lathe, MD  Additional Notes: No pain on injection. No increased resistance to injection. Injection made in 5cc increments. Good needle visualization. Patient tolerated the procedure well.

## 2023-02-14 NOTE — Anesthesia Procedure Notes (Signed)
Spinal  Patient location during procedure: OR Start time: 02/14/2023 8:21 AM End time: 02/14/2023 8:24 AM Reason for block: surgical anesthesia Staffing Performed: anesthesiologist  Anesthesiologist: Beryle Lathe, MD Performed by: Beryle Lathe, MD Authorized by: Beryle Lathe, MD   Preanesthetic Checklist Completed: patient identified, IV checked, risks and benefits discussed, surgical consent, monitors and equipment checked, pre-op evaluation and timeout performed Spinal Block Patient position: sitting Prep: DuraPrep Patient monitoring: heart rate, cardiac monitor, continuous pulse ox and blood pressure Approach: midline Location: L3-4 Injection technique: single-shot Needle Needle type: Pencan  Needle gauge: 24 G Additional Notes Consent was obtained prior to the procedure with all questions answered and concerns addressed. Risks including, but not limited to, bleeding, infection, nerve damage, paralysis, failed block, inadequate analgesia, allergic reaction, high spinal, itching, and headache were discussed and the patient wished to proceed. Functioning IV was confirmed and monitors were applied. Sterile prep and drape, including hand hygiene, mask, and sterile gloves were used. The patient was positioned and the spine was prepped. The skin was anesthetized with lidocaine. Free flow of clear CSF was obtained prior to injecting local anesthetic into the CSF. The spinal needle aspirated freely following injection. The needle was carefully withdrawn. The patient tolerated the procedure well.   Leslye Peer, MD

## 2023-02-14 NOTE — Transfer of Care (Signed)
Immediate Anesthesia Transfer of Care Note  Patient: Patricia Carrillo  Procedure(s) Performed: TOTAL KNEE ARTHROPLASTY (Left: Knee)  Patient Location: PACU  Anesthesia Type:Spinal  Level of Consciousness: awake, alert , and oriented  Airway & Oxygen Therapy: Patient Spontanous Breathing and Patient connected to face mask oxygen  Post-op Assessment: Report given to RN and Post -op Vital signs reviewed and stable  Post vital signs: Reviewed and stable  Last Vitals:  Vitals Value Taken Time  BP 113/61 02/14/23 1051  Temp    Pulse 84 02/14/23 1051  Resp 13 02/14/23 1051  SpO2 100 % 02/14/23 1051  Vitals shown include unvalidated device data.  Last Pain:  Vitals:   02/14/23 0701  PainSc: 8          Complications: No notable events documented.

## 2023-02-14 NOTE — Anesthesia Postprocedure Evaluation (Signed)
Anesthesia Post Note  Patient: Patricia Carrillo  Procedure(s) Performed: TOTAL KNEE ARTHROPLASTY (Left: Knee)     Patient location during evaluation: PACU Anesthesia Type: Spinal Level of consciousness: awake and alert Pain management: satisfactory to patient Vital Signs Assessment: post-procedure vital signs reviewed and stable Respiratory status: spontaneous breathing and respiratory function stable Cardiovascular status: blood pressure returned to baseline and stable Postop Assessment: spinal receding and no apparent nausea or vomiting Anesthetic complications: no   No notable events documented.  Last Vitals:  Vitals:   02/14/23 1245 02/14/23 1300  BP: 109/75 121/72  Pulse: 97 97  Resp: 15   Temp:    SpO2: 96% 95%                  Beryle Lathe

## 2023-02-14 NOTE — Discharge Instructions (Signed)

## 2023-02-14 NOTE — Op Note (Signed)
DATE OF SURGERY:  02/14/2023 TIME: 10:29 AM  PATIENT NAME:  Patricia Carrillo   AGE: 57 y.o.   PRE-OPERATIVE DIAGNOSIS: End-stage left knee osteoarthritis significant preop stiffness range of motion 10 to 90 degrees  POST-OPERATIVE DIAGNOSIS:  Same  PROCEDURE: Left total Knee Arthroplasty  SURGEON:  Khadim Lundberg A Alton Tremblay, MD   ASSISTANT: Kathie Dike, PA-C, present and scrubbed throughout the case, critical for assistance with exposure, retraction, instrumentation, and closure.   OPERATIVE IMPLANTS:  Press-fit Zimmer persona size 7 narrow left porous plasma spray femur, D Osseo Ti spike keel left tibial baseplate, 35mm NexGen trabecular metal patella, 10 mm MC polyethylene insert Implant Name Type Inv. Item Serial No. Manufacturer Lot No. LRB No. Used Action  CEMENT BONE REFOBACIN [id number] [place] - [id number] Cement CEMENT BONE REFOBACIN R1X40 [place]  ZIMMER RECON(ORTH,TRAU,BIO,SG) [id number] Left 2 Implanted  COMP FEM PS KNEE NRW 7 LT - [id number] Joint COMP FEM PS KNEE NRW 7 LT  ZIMMER RECON(ORTH,TRAU,BIO,SG) [id number] Left 1 Implanted  STEM TIB PS KNEE D 0D LT - [id number] Stem STEM TIB PS KNEE D 0D LT  ZIMMER RECON(ORTH,TRAU,BIO,SG) [id number] Left 1 Implanted  COMP PATELLAR 10X35 METAL - [id number] Joint COMP PATELLAR 10X35 METAL  ZIMMER RECON(ORTH,TRAU,BIO,SG) [id number] Left 1 Implanted  INSERT ARTISURF SZ 6-7 LT - [id number] Insert INSERT ARTISURF SZ 6-7 LT  ZIMMER RECON(ORTH,TRAU,BIO,SG) [id number] Left 1 Implanted      PREOPERATIVE INDICATIONS:  Patricia Carrillo is a 57 y.o. year old female with end stage bone on bone degenerative arthritis of the knee who failed conservative treatment, including injections, antiinflammatories, activity modification, and assistive devices, and had significant impairment of their activities of daily living, and elected for Total Knee Arthroplasty.   The risks, benefits, and alternatives were discussed at length including but not limited to the risks of infection,  bleeding, nerve injury, stiffness, blood clots, the need for revision surgery, cardiopulmonary complications, among others, and they were willing to proceed.  ESTIMATED BLOOD LOSS: 50cc  OPERATIVE DESCRIPTION:  Once adequate anesthesia was induced, preoperative antibiotics, 2 gm of ancef,1 gm of Tranexamic Acid, and 8 mg of Decadron administered, the patient was positioned supine with a left thigh tourniquet placed.  The left lower extremity was prepped and draped in sterile fashion.  A time-  out was performed identifying the patient, planned procedure, and the appropriate extremity.     The leg was  exsanguinated, tourniquet elevated to 250 mmHg.  A midline incision was  made followed by median parapatellar arthrotomy. Anterior horn of the medial meniscus was released and resected. A medial release was performed, the infrapatellar fat pad was resected with care taken to protect the patellar tendon. The suprapatellar fat was removed to exposed the distal anterior femur. The anterior horn of the lateral meniscus and ACL were released.    Following initial  exposure, I first started with the femur  The femoral  canal was opened with a drill, canal was suctioned to try to prevent fat emboli.  An  intramedullary rod was passed set at 5 degrees valgus, 10 mm. The distal femur was resected.  Following this resection, the tibia was  subluxated anteriorly.  Using the extramedullary guide, 10 mm of bone was resected off   the proximal lateral tibia.  We confirmed the gap would be  stable medially and laterally with a size 10mm spacer block as well as confirmed that the tibial cut was perpendicular in the coronal plane, checking with an alignment rod.    Once this  was done, the posterior femoral referencing femoral sizer was placed under to the posterior condyles with 3 degrees of external rotational which was parallel to the transepicondylar axis and perpendicular to [employer]. The femur was sized to be a  size 7 in the anterior-posterior dimension. The  anterior, posterior, and  chamfer cuts were made without difficulty nor   notching making certain that I was along the anterior cortex to help  with flexion gap stability. Next a laminar spreader was placed with the knee in flexion and the medial lateral menisci were resected.  5 cc of the Exparel mixture was injected in the medial side of the back of the knee and 3 cc in the lateral side.  1/2 inch curved osteotome was used to resect posterior osteophyte that was then removed with a pituitary rongeur.       At this point, the tibia was sized to be a size D.  The size D tray was  then pinned in position. Trial reduction was now carried with a 7 femur, D tibia, a 10mm MC insert.  The knee had full extension and was stable to varus valgus stress in extension.  The knee was slightly tight in flexion and the PCL was partially released.   Attention was next directed to the patella.  Precut  measurement was noted to be 21 mm.  I resected down to 13 mm and used a  35mm patellar button to restore patellar height as well as cover the cut surface.     The patella lug holes were drilled and a 35mm patella poly trial was placed.    The knee was brought to full extension with good flexion stability with the patella tracking through the trochlea without application of pressure.    Next the femoral component was again assessed and determined to be seated and appropriately lateralized.  The femoral lug holes were drilled.  The femoral component was then removed. Tibial component was again assessed and felt to be seated and appropriately rotated with the medial third of the tubercle. The tibia was then drilled, and keel punched.     Final components were  opened and impacted into place.   The knee was irrigated with sterile Betadine diluted in saline as well as pulse lavage normal saline. The synovial lining was  then injected a dilute Exparel.     I confirmed that I  was satisfied with the range of motion and stability, and the final 10 mm MC poly insert was chosen.  It was placed into the knee.         The tourniquet had been let down at 60 minutes.  No significant hemostasis was required.  The medial parapatellar arthrotomy was then reapproximated using #1 Stratafix sutures with the knee  in flexion.  The remaining wound was closed with 0 stratafix, 2-0 Vicryl, and running 3-0 Monocryl. The knee was cleaned, dried, dressed sterilely using Dermabond and   Aquacel dressing.  The patient was then brought to recovery room in stable condition, tolerating the procedure  well. There were no complications.   Post op recs: WB: WBAT Abx: ancef Imaging: PACU xrays DVT prophylaxis: Aspirin 81mg  BID x4 weeks Follow up: 2 weeks after surgery for a wound check with Dr. Blanchie Dessert at [facility].  Address: [address], [place], [place] [address]  Office Phone: [phone]  Weber Cooks, MD Orthopaedic Surgery

## 2023-02-14 NOTE — Evaluation (Signed)
Physical Therapy Evaluation Patient Details Name: Patricia Carrillo MRN: [id number] DOB: 06/20/1966 Today's Date: 02/14/2023  History of Present Illness  57 yo female presents to therapy s/p L TKA on 02/14/2023 due to failure of conservative measures. Pt PMH includes but is not limited to: lumbar DJD, L shoulder impingement syndrome s/p sx (2023), CKD III, HTN, and OSA.  Clinical Impression    Benjie Worthan is a 57 y.o. female POD 0 s/p L TKA. Patient reports mod I with mobility at baseline. Patient is now limited by functional impairments (see PT problem list below) and requires min guard and cues for transfers and gait with RW. Patient was able to ambulate 45 and 40 feet with RW and min guard and cues for safe walker management. Patient educated on safe sequencing for stair mobility, car transfers, pain management, CP/ICE and fall risk prevention pt and significant other verbalized understanding of safe guarding position for people assisting with mobility. Patient instructed in exercises to facilitate ROM and circulation reviewed and HO provided. Patient will benefit from continued skilled PT interventions to address impairments and progress towards PLOF. Patient has met mobility goals at adequate level for discharge home with family support and OPPT starting 5/10; will continue to follow if pt continues acute stay to progress towards Mod I goals.      Recommendations for follow up therapy are one component of a multi-disciplinary discharge planning process, led by the attending physician.  Recommendations may be updated based on patient status, additional functional criteria and insurance authorization.  Follow Up Recommendations       Assistance Recommended at Discharge Intermittent Supervision/Assistance  Patient can return home with the following  A little help with walking and/or transfers;A little help with bathing/dressing/bathroom;Assistance with cooking/housework;Assist for  transportation;Help with stairs or ramp for entrance    Equipment Recommendations None recommended by PT (pt reports DME in home setting)  Recommendations for Other Services       Functional Status Assessment Patient has had a recent decline in their functional status and demonstrates the ability to make significant improvements in function in a reasonable and predictable amount of time.     Precautions / Restrictions Precautions Precautions: Knee;Fall Restrictions Weight Bearing Restrictions: No      Mobility  Bed Mobility Overal bed mobility: Needs Assistance Bed Mobility: Supine to Sit     Supine to sit: Min guard     General bed mobility comments: cues for safety    Transfers Overall transfer level: Modified independent Equipment used: Rolling walker (2 wheels)               General transfer comment: min cues for proper UE and AD placement for bed, recliner and commode transfers    Ambulation/Gait Ambulation/Gait assistance: Min guard Gait Distance (Feet): 45 Feet Assistive device: Rolling walker (2 wheels) Gait Pattern/deviations: Step-to pattern, Antalgic Gait velocity: decreased     General Gait Details: pt indicated no increased pain wtih LUE WB through RW with recent L UE sx\ (personal RW)  Stairs Stairs: Yes Stairs assistance: Min guard Stair Management: One rail Right (B UE support on R handrail) Number of Stairs: 2 General stair comments: cues for proper sequencing and technique  Wheelchair Mobility    Modified Rankin (Stroke Patients Only)       Balance Overall balance assessment: Needs assistance Sitting-balance support: Bilateral upper extremity supported Sitting balance-Leahy Scale: Fair     Standing balance support: Bilateral upper extremity supported, During functional activity, Reliant on assistive device  for balance Standing balance-Leahy Scale: Poor                               Pertinent Vitals/Pain Pain  Assessment Pain Assessment: 0-10 Pain Score: 7  Pain Location: L leg Pain Descriptors / Indicators: Constant, Discomfort, Guarding, Heaviness Pain Intervention(s): Limited activity within patient's tolerance, Monitored during session, Premedicated before session, Repositioned, Ice applied    Home Living Family/patient expects to be discharged to:: Private residence Living Arrangements: Spouse/significant other;Children;Other relatives Available Help at Discharge: Family Type of Home: House Home Access: Stairs to enter Entrance Stairs-Rails: Right;Left;Can reach both Entrance Stairs-Number of Steps: 5 Alternate Level Stairs-Number of Steps: 14 Home Layout: Two level;Bed/bath upstairs Home Equipment: Pharmacist, hospital (2 wheels)      Prior Function Prior Level of Function : Independent/Modified Independent             Mobility Comments: mod I with ADLs, self care tasks, IADLs, intermittent use of RW due dizziness       Hand Dominance        Extremity/Trunk Assessment        Lower Extremity Assessment Lower Extremity Assessment: LLE deficits/detail LLE Deficits / Details: ankle DF/PF 5/5; SLR AA for first trial LLE Sensation: WNL    Cervical / Trunk Assessment Cervical / Trunk Assessment: Other exceptions (wfl)  Communication   Communication: No difficulties  Cognition Arousal/Alertness: Awake/alert Behavior During Therapy: WFL for tasks assessed/performed Overall Cognitive Status: Within Functional Limits for tasks assessed                                          General Comments      Exercises Total Joint Exercises Ankle Circles/Pumps: AROM, Both, 20 reps Quad Sets: AROM, Left, 5 reps Short Arc Quad: AROM, Left, 5 reps Heel Slides: AROM, Left, 5 reps Hip ABduction/ADduction: AROM, Left, 5 reps Straight Leg Raises: Left, 5 reps, AROM Long Arc Quad: AROM, Left, 5 reps   Assessment/Plan    PT Assessment Patient needs  continued PT services  PT Problem List Decreased strength;Decreased range of motion;Decreased activity tolerance;Decreased balance;Decreased mobility;Decreased coordination;Pain       PT Treatment Interventions DME instruction;Gait training;Stair training;Functional mobility training;Therapeutic activities;Therapeutic exercise;Balance training;Neuromuscular re-education;Patient/family education;Modalities    PT Goals (Current goals can be found in the Care Plan section)  Acute Rehab PT Goals Patient Stated Goal: to be able to get the other knee done and walk normally PT Goal Formulation: With patient Time For Goal Achievement: 02/28/23 Potential to Achieve Goals: Good    Frequency       Co-evaluation               AM-PAC PT "6 Clicks" Mobility  Outcome Measure Help needed turning from your back to your side while in a flat bed without using bedrails?: None Help needed moving from lying on your back to sitting on the side of a flat bed without using bedrails?: A Little Help needed moving to and from a bed to a chair (including a wheelchair)?: A Little Help needed standing up from a chair using your arms (e.g., wheelchair or bedside chair)?: A Little Help needed to walk in hospital room?: A Little Help needed climbing 3-5 steps with a railing? : A Little 6 Click Score: 19    End of Session Equipment Utilized During  Treatment: Gait belt Activity Tolerance: Patient tolerated treatment well Patient left: in chair;with call bell/phone within reach;with family/visitor present Nurse Communication: Mobility status;Other (comment) (pt readiness for d/c) PT Visit Diagnosis: Unsteadiness on feet (R26.81);Other abnormalities of gait and mobility (R26.89);Muscle weakness (generalized) (M62.81);Pain Pain - Right/Left: Left Pain - part of body: Leg;Knee;Ankle and joints of foot (pt has hx of chronic pain)    Time: 4742-5956 PT Time Calculation (min) (ACUTE ONLY): 47 min   Charges:    PT Evaluation $PT Eval Low Complexity: 1 Low PT Treatments $Gait Training: 8-22 mins $Therapeutic Exercise: 8-22 mins        Rica Mote, PT   Jacqualyn Posey 02/14/2023, 4:27 PM

## 2023-02-14 NOTE — OR Nursing (Signed)
CRITICAL VALUE STICKER  CRITICAL VALUE: CBG 68  RECEIVER -M. Hillery Hunter, RN   DATE & TIME NOTIFIED: 02/14/23 @ 0700  MESSENGER - POC testing  MD NOTIFIED: Dr. Mal Amabile, anesthesia  TIME OF NOTIFICATION: 0740  RESPONSE:  verbal order for 25mL of D50 IV. 25mL of D50 given IV at 0746

## 2023-02-14 NOTE — Interval H&P Note (Signed)

## 2023-02-14 NOTE — Progress Notes (Signed)
Orthopedic Tech Progress Note Patient Details:  Takyia Czechowski November 02, 1965 [id number]  Patient ID: Buckner Malta, female   DOB: 1966/09/06, 57 y.o.   MRN: [id number]  Kizzie Fantasia 02/14/2023, 11:16 AM Left bone foam applied in pacu.

## 2023-02-15 ENCOUNTER — Encounter (HOSPITAL_COMMUNITY): Payer: Self-pay | Admitting: Orthopedic Surgery

## 2023-02-16 ENCOUNTER — Other Ambulatory Visit: Payer: Self-pay

## 2023-02-16 ENCOUNTER — Ambulatory Visit: Payer: Medicaid Other | Attending: Orthopedic Surgery

## 2023-02-16 DIAGNOSIS — R600 Localized edema: Secondary | ICD-10-CM | POA: Diagnosis present

## 2023-02-16 DIAGNOSIS — M25662 Stiffness of left knee, not elsewhere classified: Secondary | ICD-10-CM | POA: Diagnosis present

## 2023-02-16 DIAGNOSIS — M25562 Pain in left knee: Secondary | ICD-10-CM | POA: Diagnosis present

## 2023-02-16 NOTE — Therapy (Signed)
OUTPATIENT PHYSICAL THERAPY LOWER EXTREMITY EVALUATION   Patient Name: Patricia Carrillo MRN: [id number] DOB:1966-01-21, 57 y.o., female Today's Date: 02/16/2023  END OF SESSION:  PT End of Session - 02/16/23 1040     Visit Number 1    Number of Visits 14    Date for PT Re-Evaluation 05/04/23    PT Start Time 1044    PT Stop Time 1112    PT Time Calculation (min) 28 min    Activity Tolerance Patient limited by pain    Behavior During Therapy [facility] for tasks assessed/performed             Past Medical History:  Diagnosis Date   Anxiety    Arthritis    Chronic kidney disease    "stage 3" per pt   Fatigue    Headache    History of kidney stones    Hypertension    Obesity 01/16/2022   takes metformin and ozempic "for weight loss, not diabetic"   Pre-diabetes    Sleep apnea    dx 15 years ago, never used CPAP   Past Surgical History:  Procedure Laterality Date   CHOLECYSTECTOMY     LAPAROSCOPIC HYSTERECTOMY     muiltiple knee arthroscopies bilateral      REVERSE SHOULDER ARTHROPLASTY Left 08/17/2022   Procedure: REVERSE SHOULDER ARTHROPLASTY;  Surgeon: Bjorn Pippin, MD;  Location: [facility];  Service: Orthopedics;  Laterality: Left;   SHOULDER ARTHROSCOPY WITH DISTAL CLAVICLE RESECTION Left 01/26/2022   Procedure: SHOULDER ARTHROSCOPY WITH DISTAL CLAVICLE EXCISION/ DEBRIDEMENT;  Surgeon: Bjorn Pippin, MD;  Location: [facility];  Service: Orthopedics;  Laterality: Left;   SHOULDER ARTHROSCOPY WITH SUBACROMIAL DECOMPRESSION, ROTATOR CUFF REPAIR AND BICEP TENDON REPAIR Left 01/26/2022   Procedure: SHOULDER ARTHROSCOPY WITH SUBACROMIAL DECOMPRESSION, ROTATOR CUFF REPAIR;  Surgeon: Bjorn Pippin, MD;  Location: [facility];  Service: Orthopedics;  Laterality: Left;   TONSILLECTOMY     TOTAL KNEE ARTHROPLASTY Left 02/14/2023   Procedure: TOTAL KNEE ARTHROPLASTY;  Surgeon: Joen Laura, MD;  Location: [facility];  Service:  Orthopedics;  Laterality: Left;   TUBAL LIGATION     Patient Active Problem List   Diagnosis Date Noted   Chronic right shoulder pain 02/08/2023   Lumbar degenerative disc disease 11/30/2021   Status post total shoulder arthroplasty, left 03/14/2021   Primary osteoarthritis of both knees 03/14/2021   REFERRING PROVIDER: Joen Laura, MD   REFERRING DIAG: post op left total knee replacement 02/14/23   THERAPY DIAG:  Acute pain of left knee  Stiffness of left knee, not elsewhere classified  Localized edema  Rationale for Evaluation and Treatment: Rehabilitation  ONSET DATE: 02/14/23  SUBJECTIVE:   SUBJECTIVE STATEMENT: Patient reports that she had a left knee replacement on 02/14/23. She notes that it has been hurting "like a son of a bitch" since surgery. Her pain starts at her knee and runs down her leg into her foot. She note that her knees started shaking this morning and her daughter had to help her to keep from falling. She has been using her rolling walker for all mobility.   PERTINENT HISTORY: Osteoarthritis, chronic low back pain, anxiety, chronic kidney disease, and hypertension PAIN:  Are you having pain? Yes: NPRS scale: 10/10 Pain location: left knee and lower leg Pain description: burning, sore, and sharp  Aggravating factors: moving her knee and walking Relieving factors: none really   PRECAUTIONS: Fall  WEIGHT BEARING RESTRICTIONS: No  FALLS:  Has patient fallen in last 6 months? Yes. Number of falls 1; her knee buckled in December 2023  LIVING ENVIRONMENT: Lives with: lives with their family Lives in: House/apartment Stairs: Yes: Internal: 12-14 steps; on right going up and External: 7 steps; can reach both; goes up and down sideways Has following equipment at home: Dan Humphreys - 2 wheeled  OCCUPATION: not working  PLOF: Independent  PATIENT GOALS: reduced pain and improved mobility  NEXT MD VISIT: 03/01/23  OBJECTIVE:   DIAGNOSTIC FINDINGS:  02/14/23 left knee x-ray IMPRESSION: Left knee arthroplasty without immediate postoperative complication.  PATIENT SURVEYS:  LEFS 1/80  COGNITION: Overall cognitive status: Within functional limits for tasks assessed     SENSATION: Patient reports tingling from her left knee to her foot.   EDEMA:  Circumferential: Left knee joint line: 46 cm Right knee joint line: 42 cm  PALPATION: TTP: left quadriceps, hamstrings, medial and lateral joint line, and gastroc/soleus  LOWER EXTREMITY ROM:  Active ROM Right eval Left eval  Hip flexion    Hip extension    Hip abduction    Hip adduction    Hip internal rotation    Hip external rotation    Knee flexion 93 67; limited by pain  Knee extension 3 12; limited by pain  Ankle dorsiflexion    Ankle plantarflexion    Ankle inversion    Ankle eversion     (Blank rows = not tested)  LOWER EXTREMITY MMT: Not tested due to surgical condition  LOWER EXTREMITY SPECIAL TESTS:  Not tested due to surgical condition  FUNCTIONAL TESTS:  Patient required modA with bed mobility for left lower extremity management  GAIT: Assistive device utilized: Walker - 2 wheeled Level of assistance: Modified independence Comments: Shuffling pattern   TODAY'S TREATMENT:                                                                                                                              DATE:     PATIENT EDUCATION:  Education details: walking, moving her knee, healing, prognosis, plan of care, and goals for therapy Person educated: Patient and Child(ren) Education method: Explanation Education comprehension: verbalized understanding  HOME EXERCISE PROGRAM:   ASSESSMENT:  CLINICAL IMPRESSION: Patient is a 57 y.o. female who was seen today for physical therapy evaluation and treatment following a left total knee arthroplasty on 02/14/23. She presented with high pain severity and irritability with left knee range of motion being the most  aggravating to her familiar symptoms.  She exhibited increased left lower extremity edema, but no signs or symptoms of a DVT or infection. Recommend that she continue with skilled physical therapy to address her impairments to return to her prior level of function.  OBJECTIVE IMPAIRMENTS: Abnormal gait, decreased activity tolerance, decreased balance, decreased mobility, difficulty walking, decreased ROM, decreased strength, hypomobility, increased edema, impaired tone, and pain.   ACTIVITY LIMITATIONS: carrying, lifting, standing, squatting, sleeping, stairs, transfers, bed mobility,  bathing, dressing, hygiene/grooming, and locomotion level  PARTICIPATION LIMITATIONS: meal prep, cleaning, laundry, driving, shopping, and community activity  PERSONAL FACTORS: Behavior pattern and 3+ comorbidities: Osteoarthritis, chronic low back pain, anxiety, chronic kidney disease, and hypertension  are also affecting patient's functional outcome.   REHAB POTENTIAL: Good  CLINICAL DECISION MAKING: Evolving/moderate complexity  EVALUATION COMPLEXITY: Moderate   GOALS: Goals reviewed with patient? Yes  SHORT TERM GOALS: Target date: 03/16/23 Patient will be independent with her initial HEP. Baseline: Goal status: INITIAL  2.  Patient will be able to demonstrate at least 90 degrees of left knee flexion for improved knee mobility. Baseline:  Goal status: INITIAL  3.  Patient will be able to demonstrate active left knee extension within 10 degrees of neutral for improved gait mechanics. Baseline:  Goal status: INITIAL  4.  Patient will be able to complete her daily activities without her familiar pain exceeding 8/10. Baseline:  Goal status: INITIAL  5.  Patient will improve her LEFS score to 12/80.  Baseline:  Goal status: INITIAL  LONG TERM GOALS: Target date: 04/06/23  Patient will be independent with her advanced HEP.  Baseline:  Goal status: INITIAL  2.  Patient will be able to  demonstrate at least 115 degrees of left knee flexion for improved function navigating stairs. Baseline:  Goal status: INITIAL  3.  Patient will be able to demonstrate active left knee extension within 5 degrees of neutral for improved gait mechanics. Baseline:  Goal status: INITIAL  4.  Patient will be able to safely navigate at least 80 feet without an assistive device for improved household mobility. Baseline:  Goal status: INITIAL  5.  Patient will be able to navigate at least 4 steps with a reciprocal pattern for improved household mobility. Baseline:  Goal status: INITIAL  6.  Patient will improve her LEFS score to at least 23/80 Baseline:  Goal status: INITIAL   PLAN:  PT FREQUENCY: 2x/week  PT DURATION: other: 7 weeks  PLANNED INTERVENTIONS: Therapeutic exercises, Therapeutic activity, Neuromuscular re-education, Balance training, Gait training, Patient/Family education, Self Care, Joint mobilization, Stair training, Cryotherapy, Moist heat, Manual therapy, and Re-evaluation  PLAN FOR NEXT SESSION: NuStep, quad sets, heel slides, passive range of motion, and manual therapy   Granville Lewis, PT 02/16/2023, 1:23 PM

## 2023-02-21 ENCOUNTER — Ambulatory Visit: Payer: Medicaid Other | Admitting: Physical Therapy

## 2023-02-21 ENCOUNTER — Encounter: Payer: Self-pay | Admitting: Physical Therapy

## 2023-02-21 DIAGNOSIS — M25562 Pain in left knee: Secondary | ICD-10-CM

## 2023-02-21 DIAGNOSIS — M25662 Stiffness of left knee, not elsewhere classified: Secondary | ICD-10-CM

## 2023-02-21 DIAGNOSIS — R600 Localized edema: Secondary | ICD-10-CM

## 2023-02-21 NOTE — Therapy (Signed)
OUTPATIENT PHYSICAL THERAPY LOWER EXTREMITY TREATMENT   Patient Name: Patricia Carrillo MRN: [id number] DOB:[birth year], 57 y.o., female Today's Date: 02/21/2023  END OF SESSION:  PT End of Session - 02/21/23 1337     Visit Number 2    Number of Visits 14    Date for PT Re-Evaluation 05/04/23    PT Start Time 1306    PT Stop Time 1333    PT Time Calculation (min) 27 min    Equipment Utilized During Treatment Other (comment)   FWW   Activity Tolerance Patient limited by pain    Behavior During Therapy WFL for tasks assessed/performed            Past Medical History:  Diagnosis Date   Anxiety    Arthritis    Chronic kidney disease    "stage 3" per pt   Fatigue    Headache    History of kidney stones    Hypertension    Obesity 01/16/2022   takes metformin and ozempic "for weight loss, not diabetic"   Pre-diabetes    Sleep apnea    dx 15 years ago, never used CPAP   Past Surgical History:  Procedure Laterality Date   CHOLECYSTECTOMY     LAPAROSCOPIC HYSTERECTOMY     muiltiple knee arthroscopies bilateral      REVERSE SHOULDER ARTHROPLASTY Left 08/17/2022   Procedure: REVERSE SHOULDER ARTHROPLASTY;  Surgeon: Bjorn Pippin, MD;  Location: [facility];  Service: Orthopedics;  Laterality: Left;   SHOULDER ARTHROSCOPY WITH DISTAL CLAVICLE RESECTION Left 01/26/2022   Procedure: SHOULDER ARTHROSCOPY WITH DISTAL CLAVICLE EXCISION/ DEBRIDEMENT;  Surgeon: Bjorn Pippin, MD;  Location: [facility];  Service: Orthopedics;  Laterality: Left;   SHOULDER ARTHROSCOPY WITH SUBACROMIAL DECOMPRESSION, ROTATOR CUFF REPAIR AND BICEP TENDON REPAIR Left 01/26/2022   Procedure: SHOULDER ARTHROSCOPY WITH SUBACROMIAL DECOMPRESSION, ROTATOR CUFF REPAIR;  Surgeon: Bjorn Pippin, MD;  [facility];  Service: Orthopedics;  Laterality: Left;   TONSILLECTOMY     TOTAL KNEE ARTHROPLASTY Left 02/14/2023   Procedure: TOTAL KNEE ARTHROPLASTY;  Surgeon:  Joen Laura, MD;  Location: [facility];  Service: Orthopedics;  Laterality: Left;   TUBAL LIGATION     Patient Active Problem List   Diagnosis Date Noted   Chronic right shoulder pain 02/08/2023   Lumbar degenerative disc disease 11/30/2021   Status post total shoulder arthroplasty, left 03/14/2021   Primary osteoarthritis of both knees 03/14/2021   REFERRING PROVIDER: Joen Laura, MD   REFERRING DIAG: post op left total knee replacement 02/14/23   THERAPY DIAG:  Acute pain of left knee  Stiffness of left knee, not elsewhere classified  Localized edema  Rationale for Evaluation and Treatment: Rehabilitation  ONSET DATE: 02/14/23  SUBJECTIVE:   SUBJECTIVE STATEMENT: Reports that her leg has swelled to double the normal size and has to help LLE if getting onto bed or in car. Patient states that she is staying on schedule with pain medication. Fell on 02/17/2023 as she felt her knees buckling and hasn't had that since 09/2022. Unable to determine in L knee or R knee buckled first.  PERTINENT HISTORY: Osteoarthritis, chronic low back pain, anxiety, chronic kidney disease, and hypertension  PAIN:  Are you having pain? Yes: NPRS scale: 10+/10 Pain location: left knee and lower leg Pain description: burning, sore, and sharp  Aggravating factors: moving her knee and walking Relieving factors: none really   PRECAUTIONS: Fall  WEIGHT BEARING RESTRICTIONS: No  FALLS:  Has patient fallen in last 6 months? Yes. Number of falls 1; her knee buckled in December 2023  PATIENT GOALS: reduced pain and improved mobility  NEXT MD VISIT: 03/01/23  OBJECTIVE:   DIAGNOSTIC FINDINGS: 02/14/23 left knee x-ray IMPRESSION: Left knee arthroplasty without immediate postoperative complication.  PATIENT SURVEYS:  LEFS 1/80  COGNITION: Overall cognitive status: Within functional limits for tasks assessed     SENSATION: Patient reports tingling from her left knee to her foot.    EDEMA:  Circumferential: Left knee joint line: 46 cm Right knee joint line: 42 cm  PALPATION: TTP: left quadriceps, hamstrings, medial and lateral joint line, and gastroc/soleus  LOWER EXTREMITY ROM:  Active ROM Right eval Left eval  Hip flexion    Hip extension    Hip abduction    Hip adduction    Hip internal rotation    Hip external rotation    Knee flexion 93 67; limited by pain  Knee extension 3 12; limited by pain  Ankle dorsiflexion    Ankle plantarflexion    Ankle inversion    Ankle eversion     (Blank rows = not tested)  LOWER EXTREMITY MMT: Not tested due to surgical condition  LOWER EXTREMITY SPECIAL TESTS:  Not tested due to surgical condition  FUNCTIONAL TESTS:  Patient required modA with bed mobility for left lower extremity management  GAIT: Assistive device utilized: Walker - 2 wheeled Level of assistance: Modified independence Comments: Shuffling pattern  TODAY'S TREATMENT:                                                                                                                              DATE:  02/21/23  EXERCISE LOG  Exercise Repetitions and Resistance Comments  SAQ X15 reps 3 sec holds   Heel slides X15 reps   Heel prop for extension X3 min            Blank cell = exercise not performed today   Manual Therapy Soft Tissue Mobilization: L quad, hip adductors, ITB, calf, HS, light pressure to reduce muscle tightness/guarding and alleviate pain    PATIENT EDUCATION:  Education details: walking, moving her knee, healing, prognosis, plan of care, and goals for therapy Person educated: Patient and Child(ren) Education method: Explanation Education comprehension: verbalized understanding  HOME EXERCISE PROGRAM:  ASSESSMENT:  CLINICAL IMPRESSION: Patient presented in clinic with reports of 10+/10 L knee pain with increased edema postsurgically. Patient able to tolerate minimal exercise with pain reported at end of avaliable knee  flexion range and with LLE lifting to transfers from sitting to supine. Patient requires max assist +1 to transfer from sitting to supine. Muscle tightness palpable throughout L quad, hip adductors, ITB, and HS. Light pressure utilized for STW due to sensitivity and tenderness reported by patient. Patient encouraged to stay on medication schedule as directed by surgeon as well as use ice and elevation for postsurgical pain and edema.  OBJECTIVE  IMPAIRMENTS: Abnormal gait, decreased activity tolerance, decreased balance, decreased mobility, difficulty walking, decreased ROM, decreased strength, hypomobility, increased edema, impaired tone, and pain.   ACTIVITY LIMITATIONS: carrying, lifting, standing, squatting, sleeping, stairs, transfers, bed mobility, bathing, dressing, hygiene/grooming, and locomotion level  PARTICIPATION LIMITATIONS: meal prep, cleaning, laundry, driving, shopping, and community activity  PERSONAL FACTORS: Behavior pattern and 3+ comorbidities: Osteoarthritis, chronic low back pain, anxiety, chronic kidney disease, and hypertension  are also affecting patient's functional outcome.   REHAB POTENTIAL: Good  CLINICAL DECISION MAKING: Evolving/moderate complexity  EVALUATION COMPLEXITY: Moderate  GOALS: Goals reviewed with patient? Yes  SHORT TERM GOALS: Target date: 03/16/23 Patient will be independent with her initial HEP. Baseline: Goal status: INITIAL  2.  Patient will be able to demonstrate at least 90 degrees of left knee flexion for improved knee mobility. Baseline:  Goal status: INITIAL  3.  Patient will be able to demonstrate active left knee extension within 10 degrees of neutral for improved gait mechanics. Baseline:  Goal status: INITIAL  4.  Patient will be able to complete her daily activities without her familiar pain exceeding 8/10. Baseline:  Goal status: INITIAL  5.  Patient will improve her LEFS score to 12/80.  Baseline:  Goal status:  INITIAL  LONG TERM GOALS: Target date: 04/06/23  Patient will be independent with her advanced HEP.  Baseline:  Goal status: INITIAL  2.  Patient will be able to demonstrate at least 115 degrees of left knee flexion for improved function navigating stairs. Baseline:  Goal status: INITIAL  3.  Patient will be able to demonstrate active left knee extension within 5 degrees of neutral for improved gait mechanics. Baseline:  Goal status: INITIAL  4.  Patient will be able to safely navigate at least 80 feet without an assistive device for improved household mobility. Baseline:  Goal status: INITIAL  5.  Patient will be able to navigate at least 4 steps with a reciprocal pattern for improved household mobility. Baseline:  Goal status: INITIAL  6.  Patient will improve her LEFS score to at least 23/80 Baseline:  Goal status: INITIAL  PLAN:  PT FREQUENCY: 2x/week  PT DURATION: other: 7 weeks  PLANNED INTERVENTIONS: Therapeutic exercises, Therapeutic activity, Neuromuscular re-education, Balance training, Gait training, Patient/Family education, Self Care, Joint mobilization, Stair training, Cryotherapy, Moist heat, Manual therapy, and Re-evaluation  PLAN FOR NEXT SESSION: NuStep, quad sets, heel slides, passive range of motion, and manual therapy  Marvell Fuller, PTA 02/21/2023, 1:50 PM

## 2023-02-22 ENCOUNTER — Encounter: Payer: Self-pay | Admitting: Physical Therapy

## 2023-02-22 ENCOUNTER — Ambulatory Visit: Payer: Medicaid Other | Admitting: Physical Therapy

## 2023-02-22 DIAGNOSIS — M25662 Stiffness of left knee, not elsewhere classified: Secondary | ICD-10-CM

## 2023-02-22 DIAGNOSIS — M25562 Pain in left knee: Secondary | ICD-10-CM

## 2023-02-22 DIAGNOSIS — R600 Localized edema: Secondary | ICD-10-CM

## 2023-02-22 NOTE — Therapy (Signed)
OUTPATIENT PHYSICAL THERAPY LOWER EXTREMITY TREATMENT   Patient Name: Patricia Carrillo MRN: [id number] DOB:09/27/1966, 57 y.o., female Today's Date: 02/22/2023  END OF SESSION:  PT End of Session - 02/22/23 1528     Visit Number 3    Number of Visits 14    Date for PT Re-Evaluation 05/04/23    PT Start Time 1528    PT Stop Time 1609    PT Time Calculation (min) 41 min    Equipment Utilized During Treatment Other (comment)   FWW   Activity Tolerance Patient limited by pain    Behavior During Therapy [facility] for tasks assessed/performed            Past Medical History:  Diagnosis Date   Anxiety    Arthritis    Chronic kidney disease    "stage 3" per pt   Fatigue    Headache    History of kidney stones    Hypertension    Obesity 01/16/2022   takes metformin and ozempic "for weight loss, not diabetic"   Pre-diabetes    Sleep apnea    dx 15 years ago, never used CPAP   Past Surgical History:  Procedure Laterality Date   CHOLECYSTECTOMY     LAPAROSCOPIC HYSTERECTOMY     muiltiple knee arthroscopies bilateral      REVERSE SHOULDER ARTHROPLASTY Left 08/17/2022   Procedure: REVERSE SHOULDER ARTHROPLASTY;  Surgeon: Bjorn Pippin, MD;  Location: [facility];  Service: Orthopedics;  Laterality: Left;   SHOULDER ARTHROSCOPY WITH DISTAL CLAVICLE RESECTION Left 01/26/2022   Procedure: SHOULDER ARTHROSCOPY WITH DISTAL CLAVICLE EXCISION/ DEBRIDEMENT;  Surgeon: Bjorn Pippin, MD;  Location: [facility];  Service: Orthopedics;  Laterality: Left;   SHOULDER ARTHROSCOPY WITH SUBACROMIAL DECOMPRESSION, ROTATOR CUFF REPAIR AND BICEP TENDON REPAIR Left 01/26/2022   Procedure: SHOULDER ARTHROSCOPY WITH SUBACROMIAL DECOMPRESSION, ROTATOR CUFF REPAIR;  Surgeon: Bjorn Pippin, MD;  Location: [facility];  Service: Orthopedics;  Laterality: Left;   TONSILLECTOMY     TOTAL KNEE ARTHROPLASTY Left 02/14/2023   Procedure: TOTAL KNEE ARTHROPLASTY;  Surgeon:  Joen Laura, MD;  Location: [facility];  Service: Orthopedics;  Laterality: Left;   TUBAL LIGATION     Patient Active Problem List   Diagnosis Date Noted   Chronic right shoulder pain 02/08/2023   Lumbar degenerative disc disease 11/30/2021   Status post total shoulder arthroplasty, left 03/14/2021   Primary osteoarthritis of both knees 03/14/2021   REFERRING PROVIDER: Joen Laura, MD   REFERRING DIAG: post op left total knee replacement 02/14/23   THERAPY DIAG:  Acute pain of left knee  Stiffness of left knee, not elsewhere classified  Localized edema  Rationale for Evaluation and Treatment: Rehabilitation  ONSET DATE: 02/14/23  SUBJECTIVE:   SUBJECTIVE STATEMENT: Reports she is still 10/10 but a different kind of pain today. More in posterior L knee and ankle.  PERTINENT HISTORY: Osteoarthritis, chronic low back pain, anxiety, chronic kidney disease, and hypertension  PAIN:  Are you having pain? Yes: NPRS scale: 10/10 Pain location: left knee and ankle Pain description: burning, sore, and sharp  Aggravating factors: moving her knee and walking Relieving factors: none really   PRECAUTIONS: Fall  WEIGHT BEARING RESTRICTIONS: No  FALLS:  Has patient fallen in last 6 months? Yes. Number of falls 1; her knee buckled in December 2023  PATIENT GOALS: reduced pain and improved mobility  NEXT MD VISIT: 03/01/23  OBJECTIVE:   DIAGNOSTIC FINDINGS: 02/14/23 left knee  x-ray IMPRESSION: Left knee arthroplasty without immediate postoperative complication.  PATIENT SURVEYS:  LEFS 1/80  COGNITION: Overall cognitive status: Within functional limits for tasks assessed     SENSATION: Patient reports tingling from her left knee to her foot.   EDEMA:  Circumferential: Left knee joint line: 46 cm Right knee joint line: 42 cm  PALPATION: TTP: left quadriceps, hamstrings, medial and lateral joint line, and gastroc/soleus  LOWER EXTREMITY ROM:  Active ROM  Right eval Left eval Left knee 02/22/23  Hip flexion     Hip extension     Hip abduction     Hip adduction     Hip internal rotation     Hip external rotation     Knee flexion 93 67; limited by pain 58  Knee extension 3 12; limited by pain 0  Ankle dorsiflexion     Ankle plantarflexion     Ankle inversion     Ankle eversion      (Blank rows = not tested)  LOWER EXTREMITY MMT: Not tested due to surgical condition  LOWER EXTREMITY SPECIAL TESTS:  Not tested due to surgical condition  FUNCTIONAL TESTS:  Patient required modA with bed mobility for left lower extremity management  GAIT: Assistive device utilized: Walker - 2 wheeled Level of assistance: Modified independence Comments: Shuffling pattern  TODAY'S TREATMENT:                                                                                                                              DATE:  02/22/23  EXERCISE LOG  Exercise Repetitions and Resistance Comments  SAQ X20 reps 3 sec holds   Heel slides X15 reps   Heel prop for extension X5 min   QS X15 reps 3 sec holds        Blank cell = exercise not performed today   Manual Therapy Soft Tissue Mobilization: L quad, hip adductors, ITB, calf, HS, light pressure to reduce muscle tightness/guarding and alleviate pain    PATIENT EDUCATION:  Education details: walking, moving her knee, healing, prognosis, plan of care, and goals for therapy Person educated: Patient and Child(ren) Education method: Explanation Education comprehension: verbalized understanding  HOME EXERCISE PROGRAM:  ASSESSMENT:  CLINICAL IMPRESSION: Patient continues to present in PT with 10/10 pain but reports more posterior knee/ superior calf and ankle pain today. Patient also indicates she is not sleeping well. Limited L knee flexion assessed with therex and measured but has full L knee extension. Muscle tightness palpable throughout L thigh and calf. No notable ecchymosis noted in L ankle.    OBJECTIVE IMPAIRMENTS: Abnormal gait, decreased activity tolerance, decreased balance, decreased mobility, difficulty walking, decreased ROM, decreased strength, hypomobility, increased edema, impaired tone, and pain.   ACTIVITY LIMITATIONS: carrying, lifting, standing, squatting, sleeping, stairs, transfers, bed mobility, bathing, dressing, hygiene/grooming, and locomotion level  PARTICIPATION LIMITATIONS: meal prep, cleaning, laundry, driving, shopping, and community activity  PERSONAL FACTORS: Behavior pattern and 3+ comorbidities: Osteoarthritis,  chronic low back pain, anxiety, chronic kidney disease, and hypertension  are also affecting patient's functional outcome.   REHAB POTENTIAL: Good  CLINICAL DECISION MAKING: Evolving/moderate complexity  EVALUATION COMPLEXITY: Moderate  GOALS: Goals reviewed with patient? Yes  SHORT TERM GOALS: Target date: 03/16/23 Patient will be independent with her initial HEP. Baseline: Goal status: INITIAL  2.  Patient will be able to demonstrate at least 90 degrees of left knee flexion for improved knee mobility. Baseline:  Goal status: INITIAL  3.  Patient will be able to demonstrate active left knee extension within 10 degrees of neutral for improved gait mechanics. Baseline:  Goal status: MET  4.  Patient will be able to complete her daily activities without her familiar pain exceeding 8/10. Baseline:  Goal status: INITIAL  5.  Patient will improve her LEFS score to 12/80.  Baseline:  Goal status: INITIAL  LONG TERM GOALS: Target date: 04/06/23  Patient will be independent with her advanced HEP.  Baseline:  Goal status: INITIAL  2.  Patient will be able to demonstrate at least 115 degrees of left knee flexion for improved function navigating stairs. Baseline:  Goal status: INITIAL  3.  Patient will be able to demonstrate active left knee extension within 5 degrees of neutral for improved gait mechanics. Baseline:  Goal status:  MET  4.  Patient will be able to safely navigate at least 80 feet without an assistive device for improved household mobility. Baseline:  Goal status: INITIAL  5.  Patient will be able to navigate at least 4 steps with a reciprocal pattern for improved household mobility. Baseline:  Goal status: INITIAL  6.  Patient will improve her LEFS score to at least 23/80 Baseline:  Goal status: INITIAL  PLAN:  PT FREQUENCY: 2x/week  PT DURATION: other: 7 weeks  PLANNED INTERVENTIONS: Therapeutic exercises, Therapeutic activity, Neuromuscular re-education, Balance training, Gait training, Patient/Family education, Self Care, Joint mobilization, Stair training, Cryotherapy, Moist heat, Manual therapy, and Re-evaluation  PLAN FOR NEXT SESSION: NuStep, quad sets, heel slides, passive range of motion, and manual therapy  Marvell Fuller, PTA 02/22/2023, 4:21 PM

## 2023-02-27 ENCOUNTER — Ambulatory Visit: Payer: Medicaid Other

## 2023-02-27 DIAGNOSIS — R600 Localized edema: Secondary | ICD-10-CM

## 2023-02-27 DIAGNOSIS — M25562 Pain in left knee: Secondary | ICD-10-CM | POA: Diagnosis not present

## 2023-02-27 DIAGNOSIS — M25662 Stiffness of left knee, not elsewhere classified: Secondary | ICD-10-CM

## 2023-02-27 NOTE — Therapy (Signed)
OUTPATIENT PHYSICAL THERAPY LOWER EXTREMITY TREATMENT   Patient Name: Patricia Carrillo MRN: [id number] DOB:03-22-1966, 57 y.o., female Today's Date: 02/27/2023  END OF SESSION:  PT End of Session - 02/27/23 0950     Visit Number 4    Number of Visits 14    Date for PT Re-Evaluation 05/04/23    PT Start Time 0945    PT Stop Time 1030    PT Time Calculation (min) 45 min    Equipment Utilized During Treatment Other (comment)   FWW   Activity Tolerance Patient limited by pain    Behavior During Therapy [facility] for tasks assessed/performed            Past Medical History:  Diagnosis Date   Anxiety    Arthritis    Chronic kidney disease    "stage 3" per pt   Fatigue    Headache    History of kidney stones    Hypertension    Obesity 01/16/2022   takes metformin and ozempic "for weight loss, not diabetic"   Pre-diabetes    Sleep apnea    dx 15 years ago, never used CPAP   Past Surgical History:  Procedure Laterality Date   CHOLECYSTECTOMY     LAPAROSCOPIC HYSTERECTOMY     muiltiple knee arthroscopies bilateral      REVERSE SHOULDER ARTHROPLASTY Left 08/17/2022   Procedure: REVERSE SHOULDER ARTHROPLASTY;  Surgeon: Bjorn Pippin, MD;  Location: [facility];  Service: Orthopedics;  Laterality: Left;   SHOULDER ARTHROSCOPY WITH DISTAL CLAVICLE RESECTION Left 01/26/2022   Procedure: SHOULDER ARTHROSCOPY WITH DISTAL CLAVICLE EXCISION/ DEBRIDEMENT;  Surgeon: Bjorn Pippin, MD;  Location: [facility];  Service: Orthopedics;  Laterality: Left;   SHOULDER ARTHROSCOPY WITH SUBACROMIAL DECOMPRESSION, ROTATOR CUFF REPAIR AND BICEP TENDON REPAIR Left 01/26/2022   Procedure: SHOULDER ARTHROSCOPY WITH SUBACROMIAL DECOMPRESSION, ROTATOR CUFF REPAIR;  Surgeon: Bjorn Pippin, MD;  Location: [facility];  Service: Orthopedics;  Laterality: Left;   TONSILLECTOMY     TOTAL KNEE ARTHROPLASTY Left 02/14/2023   Procedure: TOTAL KNEE ARTHROPLASTY;  Surgeon:  Joen Laura, MD;  Location: [facility];  Service: Orthopedics;  Laterality: Left;   TUBAL LIGATION     Patient Active Problem List   Diagnosis Date Noted   Chronic right shoulder pain 02/08/2023   Lumbar degenerative disc disease 11/30/2021   Status post total shoulder arthroplasty, left 03/14/2021   Primary osteoarthritis of both knees 03/14/2021   REFERRING PROVIDER: Joen Laura, MD   REFERRING DIAG: post op left total knee replacement 02/14/23   THERAPY DIAG:  Acute pain of left knee  Stiffness of left knee, not elsewhere classified  Localized edema  Rationale for Evaluation and Treatment: Rehabilitation  ONSET DATE: 02/14/23  SUBJECTIVE:   SUBJECTIVE STATEMENT: Pt reports 10/10 left knee pain.    PERTINENT HISTORY: Osteoarthritis, chronic low back pain, anxiety, chronic kidney disease, and hypertension  PAIN:  Are you having pain? Yes: NPRS scale: 10/10 Pain location: left knee and ankle Pain description: burning, sore, and sharp  Aggravating factors: moving her knee and walking Relieving factors: none really   PRECAUTIONS: Fall  WEIGHT BEARING RESTRICTIONS: No  FALLS:  Has patient fallen in last 6 months? Yes. Number of falls 1; her knee buckled in December 2023  PATIENT GOALS: reduced pain and improved mobility  NEXT MD VISIT: 03/01/23  OBJECTIVE:   DIAGNOSTIC FINDINGS: 02/14/23 left knee x-ray IMPRESSION: Left knee arthroplasty without immediate postoperative complication.  PATIENT  SURVEYS:  LEFS 1/80  COGNITION: Overall cognitive status: Within functional limits for tasks assessed     SENSATION: Patient reports tingling from her left knee to her foot.   EDEMA:  Circumferential: Left knee joint line: 46 cm Right knee joint line: 42 cm  PALPATION: TTP: left quadriceps, hamstrings, medial and lateral joint line, and gastroc/soleus  LOWER EXTREMITY ROM:  Active ROM Right eval Left eval Left knee 02/22/23  Hip flexion     Hip  extension     Hip abduction     Hip adduction     Hip internal rotation     Hip external rotation     Knee flexion 93 67; limited by pain 58  Knee extension 3 12; limited by pain 0  Ankle dorsiflexion     Ankle plantarflexion     Ankle inversion     Ankle eversion      (Blank rows = not tested)  LOWER EXTREMITY MMT: Not tested due to surgical condition  LOWER EXTREMITY SPECIAL TESTS:  Not tested due to surgical condition  FUNCTIONAL TESTS:  Patient required modA with bed mobility for left lower extremity management  GAIT: Assistive device utilized: Walker - 2 wheeled Level of assistance: Modified independence Comments: Shuffling pattern  TODAY'S TREATMENT:                                                                                                                              DATE:  02/27/23  EXERCISE LOG  Exercise Repetitions and Resistance Comments  Nustep Lvl 1 x 16 mins; seat 8   SAQ X20 reps 3 sec holds   Heel slides X20 reps   Heel prop for extension X5 min   QS X15 reps 3 sec holds        Blank cell = exercise not performed today   Manual Therapy Soft Tissue Mobilization: L quad, hip adductors, ITB, calf, HS, light pressure to reduce muscle tightness/guarding and alleviate pain    PATIENT EDUCATION:  Education details: walking, moving her knee, healing, prognosis, plan of care, and goals for therapy Person educated: Patient and Child(ren) Education method: Explanation Education comprehension: verbalized understanding  HOME EXERCISE PROGRAM:  ASSESSMENT:  CLINICAL IMPRESSION: Pt arrives for today's treatment session reporting 10/10 left knee pain.  Pt able to tolerate introduction to Nustep today for warm up with minimal discomfort.  Pt very limited by pain and fear of pain.  Pt encouraged to perform heel slides as well as lunge stretch at home to increase ROM.  STW/M performed to left quad, ITB, calf, and HS to decrease pain and tone.  Pt reported minimal  decrease in pain at completion of today's treatment session.  OBJECTIVE IMPAIRMENTS: Abnormal gait, decreased activity tolerance, decreased balance, decreased mobility, difficulty walking, decreased ROM, decreased strength, hypomobility, increased edema, impaired tone, and pain.   ACTIVITY LIMITATIONS: carrying, lifting, standing, squatting, sleeping, stairs, transfers, bed mobility, bathing, dressing, hygiene/grooming, and locomotion level  PARTICIPATION LIMITATIONS: meal prep, cleaning, laundry, driving, shopping, and community activity  PERSONAL FACTORS: Behavior pattern and 3+ comorbidities: Osteoarthritis, chronic low back pain, anxiety, chronic kidney disease, and hypertension  are also affecting patient's functional outcome.   REHAB POTENTIAL: Good  CLINICAL DECISION MAKING: Evolving/moderate complexity  EVALUATION COMPLEXITY: Moderate  GOALS: Goals reviewed with patient? Yes  SHORT TERM GOALS: Target date: 03/16/23 Patient will be independent with her initial HEP. Baseline: Goal status: INITIAL  2.  Patient will be able to demonstrate at least 90 degrees of left knee flexion for improved knee mobility. Baseline:  Goal status: INITIAL  3.  Patient will be able to demonstrate active left knee extension within 10 degrees of neutral for improved gait mechanics. Baseline:  Goal status: MET  4.  Patient will be able to complete her daily activities without her familiar pain exceeding 8/10. Baseline:  Goal status: INITIAL  5.  Patient will improve her LEFS score to 12/80.  Baseline:  Goal status: INITIAL  LONG TERM GOALS: Target date: 04/06/23  Patient will be independent with her advanced HEP.  Baseline:  Goal status: INITIAL  2.  Patient will be able to demonstrate at least 115 degrees of left knee flexion for improved function navigating stairs. Baseline:  Goal status: INITIAL  3.  Patient will be able to demonstrate active left knee extension within 5 degrees of  neutral for improved gait mechanics. Baseline:  Goal status: MET  4.  Patient will be able to safely navigate at least 80 feet without an assistive device for improved household mobility. Baseline:  Goal status: INITIAL  5.  Patient will be able to navigate at least 4 steps with a reciprocal pattern for improved household mobility. Baseline:  Goal status: INITIAL  6.  Patient will improve her LEFS score to at least 23/80 Baseline:  Goal status: INITIAL  PLAN:  PT FREQUENCY: 2x/week  PT DURATION: other: 7 weeks  PLANNED INTERVENTIONS: Therapeutic exercises, Therapeutic activity, Neuromuscular re-education, Balance training, Gait training, Patient/Family education, Self Care, Joint mobilization, Stair training, Cryotherapy, Moist heat, Manual therapy, and Re-evaluation  PLAN FOR NEXT SESSION: NuStep, quad sets, heel slides, passive range of motion, and manual therapy  Newman Pies, PTA 02/27/2023, 10:46 AM

## 2023-03-02 ENCOUNTER — Ambulatory Visit: Payer: Medicaid Other

## 2023-03-02 DIAGNOSIS — M25562 Pain in left knee: Secondary | ICD-10-CM | POA: Diagnosis not present

## 2023-03-02 DIAGNOSIS — M25662 Stiffness of left knee, not elsewhere classified: Secondary | ICD-10-CM

## 2023-03-02 DIAGNOSIS — R600 Localized edema: Secondary | ICD-10-CM

## 2023-03-02 NOTE — Therapy (Signed)
OUTPATIENT PHYSICAL THERAPY LOWER EXTREMITY TREATMENT   Patient Name: Patricia Carrillo MRN: [id number] DOB:[birth year], 57 y.o., female Today's Date: 03/02/2023  END OF SESSION:  PT End of Session - 03/02/23 0958     Visit Number 5    Number of Visits 14    Date for PT Re-Evaluation 05/04/23    PT Start Time 0945    PT Stop Time 1030    PT Time Calculation (min) 45 min    Equipment Utilized During Treatment Other (comment)   FWW   Activity Tolerance Patient limited by pain    Behavior During Therapy [facility] for tasks assessed/performed            Past Medical History:  Diagnosis Date   Anxiety    Arthritis    Chronic kidney disease    "stage 3" per pt   Fatigue    Headache    History of kidney stones    Hypertension    Obesity 01/16/2022   takes metformin and ozempic "for weight loss, not diabetic"   Pre-diabetes    Sleep apnea    dx 15 years ago, never used CPAP   Past Surgical History:  Procedure Laterality Date   CHOLECYSTECTOMY     LAPAROSCOPIC HYSTERECTOMY     muiltiple knee arthroscopies bilateral      REVERSE SHOULDER ARTHROPLASTY Left 08/17/2022   Procedure: REVERSE SHOULDER ARTHROPLASTY;  Surgeon: Bjorn Pippin, MD;  Location: [facility];  Service: Orthopedics;  Laterality: Left;   SHOULDER ARTHROSCOPY WITH DISTAL CLAVICLE RESECTION Left 01/26/2022   Procedure: SHOULDER ARTHROSCOPY WITH DISTAL CLAVICLE EXCISION/ DEBRIDEMENT;  Surgeon: Bjorn Pippin, MD;  Location: [facility];  Service: Orthopedics;  Laterality: Left;   SHOULDER ARTHROSCOPY WITH SUBACROMIAL DECOMPRESSION, ROTATOR CUFF REPAIR AND BICEP TENDON REPAIR Left 01/26/2022   Procedure: SHOULDER ARTHROSCOPY WITH SUBACROMIAL DECOMPRESSION, ROTATOR CUFF REPAIR;  Surgeon: Bjorn Pippin, MD;  Location: [facility];  Service: Orthopedics;  Laterality: Left;   TONSILLECTOMY     TOTAL KNEE ARTHROPLASTY Left 02/14/2023   Procedure: TOTAL KNEE ARTHROPLASTY;  Surgeon:  Joen Laura, MD;  Location: [facility];  Service: Orthopedics;  Laterality: Left;   TUBAL LIGATION     Patient Active Problem List   Diagnosis Date Noted   Chronic right shoulder pain 02/08/2023   Lumbar degenerative disc disease 11/30/2021   Status post total shoulder arthroplasty, left 03/14/2021   Primary osteoarthritis of both knees 03/14/2021   REFERRING PROVIDER: Joen Laura, MD   REFERRING DIAG: post op left total knee replacement 02/14/23   THERAPY DIAG:  Acute pain of left knee  Stiffness of left knee, not elsewhere classified  Localized edema  Rationale for Evaluation and Treatment: Rehabilitation  ONSET DATE: 02/14/23  SUBJECTIVE:   SUBJECTIVE STATEMENT: Pt reports 10/10 left knee pain.    PERTINENT HISTORY: Osteoarthritis, chronic low back pain, anxiety, chronic kidney disease, and hypertension  PAIN:  Are you having pain? Yes: NPRS scale: 10/10 Pain location: left knee and ankle Pain description: burning, sore, and sharp  Aggravating factors: moving her knee and walking Relieving factors: none really   PRECAUTIONS: Fall  WEIGHT BEARING RESTRICTIONS: No  FALLS:  Has patient fallen in last 6 months? Yes. Number of falls 1; her knee buckled in December 2023  PATIENT GOALS: reduced pain and improved mobility  NEXT MD VISIT: 03/01/23  OBJECTIVE:   DIAGNOSTIC FINDINGS: 02/14/23 left knee x-ray IMPRESSION: Left knee arthroplasty without immediate postoperative complication.  PATIENT  SURVEYS:  LEFS 1/80  COGNITION: Overall cognitive status: Within functional limits for tasks assessed     SENSATION: Patient reports tingling from her left knee to her foot.   EDEMA:  Circumferential: Left knee joint line: 46 cm Right knee joint line: 42 cm  PALPATION: TTP: left quadriceps, hamstrings, medial and lateral joint line, and gastroc/soleus  LOWER EXTREMITY ROM:  Active ROM Right eval Left eval Left knee 02/22/23  Hip flexion     Hip  extension     Hip abduction     Hip adduction     Hip internal rotation     Hip external rotation     Knee flexion 93 67; limited by pain 58  Knee extension 3 12; limited by pain 0  Ankle dorsiflexion     Ankle plantarflexion     Ankle inversion     Ankle eversion      (Blank rows = not tested)  LOWER EXTREMITY MMT: Not tested due to surgical condition  LOWER EXTREMITY SPECIAL TESTS:  Not tested due to surgical condition  FUNCTIONAL TESTS:  Patient required modA with bed mobility for left lower extremity management  GAIT: Assistive device utilized: Walker - 2 wheeled Level of assistance: Modified independence Comments: Shuffling pattern  TODAY'S TREATMENT:                                                                                                                              DATE:  02/27/23  EXERCISE LOG  Exercise Repetitions and Resistance Comments  Nustep Lvl 1 x 16 mins; seat 8   SAQ X25 reps 3 sec holds   Heel slides X25 reps   Supine clams    SLR         Blank cell = exercise not performed today   Manual Therapy Soft Tissue Mobilization: L quad, hip adductors, ITB, calf, HS, light pressure to reduce muscle tightness/guarding and alleviate pain    PATIENT EDUCATION:  Education details: walking, moving her knee, healing, prognosis, plan of care, and goals for therapy Person educated: Patient and Child(ren) Education method: Explanation Education comprehension: verbalized understanding  HOME EXERCISE PROGRAM:  ASSESSMENT:  CLINICAL IMPRESSION: Pt arrives for today's treatment session reporting 10/10 left knee pain.  Pt reports seeing her MD yesterday and he would like for her to concentrate on increasing her left knee flexion ROM.  MD also removed aquacel.  Pt able to tolerate increased reps with all previously performed exercises without complaint of pain.  STW/M performed to decrease pain and tone with some relief.  Pt reported minimal decrease in pain at  completion of today's treatment session.  OBJECTIVE IMPAIRMENTS: Abnormal gait, decreased activity tolerance, decreased balance, decreased mobility, difficulty walking, decreased ROM, decreased strength, hypomobility, increased edema, impaired tone, and pain.   ACTIVITY LIMITATIONS: carrying, lifting, standing, squatting, sleeping, stairs, transfers, bed mobility, bathing, dressing, hygiene/grooming, and locomotion level  PARTICIPATION LIMITATIONS: meal prep, cleaning, laundry, driving, shopping, and  community activity  PERSONAL FACTORS: Behavior pattern and 3+ comorbidities: Osteoarthritis, chronic low back pain, anxiety, chronic kidney disease, and hypertension  are also affecting patient's functional outcome.   REHAB POTENTIAL: Good  CLINICAL DECISION MAKING: Evolving/moderate complexity  EVALUATION COMPLEXITY: Moderate  GOALS: Goals reviewed with patient? Yes  SHORT TERM GOALS: Target date: 03/16/23 Patient will be independent with her initial HEP. Baseline: Goal status: IN PROGRESS  2.  Patient will be able to demonstrate at least 90 degrees of left knee flexion for improved knee mobility. Baseline:  Goal status: IN PROGRESS  3.  Patient will be able to demonstrate active left knee extension within 10 degrees of neutral for improved gait mechanics. Baseline:  Goal status: MET  4.  Patient will be able to complete her daily activities without her familiar pain exceeding 8/10. Baseline:  Goal status: IN PROGRESS  5.  Patient will improve her LEFS score to 12/80.  Baseline:  Goal status: IN PROGRESS  LONG TERM GOALS: Target date: 04/06/23  Patient will be independent with her advanced HEP.  Baseline:  Goal status: IN PROGRESS  2.  Patient will be able to demonstrate at least 115 degrees of left knee flexion for improved function navigating stairs. Baseline:  Goal status: IN PROGRESS  3.  Patient will be able to demonstrate active left knee extension within 5 degrees  of neutral for improved gait mechanics. Baseline:  Goal status: MET  4.  Patient will be able to safely navigate at least 80 feet without an assistive device for improved household mobility. Baseline:  Goal status: IN PROGRESS  5.  Patient will be able to navigate at least 4 steps with a reciprocal pattern for improved household mobility. Baseline:  Goal status: IN PROGRESS  6.  Patient will improve her LEFS score to at least 23/80 Baseline:  Goal status: IN PROGRESS  PLAN:  PT FREQUENCY: 2x/week  PT DURATION: other: 7 weeks  PLANNED INTERVENTIONS: Therapeutic exercises, Therapeutic activity, Neuromuscular re-education, Balance training, Gait training, Patient/Family education, Self Care, Joint mobilization, Stair training, Cryotherapy, Moist heat, Manual therapy, and Re-evaluation  PLAN FOR NEXT SESSION: NuStep, quad sets, heel slides, passive range of motion, and manual therapy  Newman Pies, PTA 03/02/2023, 11:22 AM

## 2023-03-07 ENCOUNTER — Ambulatory Visit: Payer: Medicaid Other

## 2023-03-07 DIAGNOSIS — R600 Localized edema: Secondary | ICD-10-CM

## 2023-03-07 DIAGNOSIS — M25662 Stiffness of left knee, not elsewhere classified: Secondary | ICD-10-CM

## 2023-03-07 DIAGNOSIS — M25562 Pain in left knee: Secondary | ICD-10-CM | POA: Diagnosis not present

## 2023-03-07 NOTE — Therapy (Signed)
OUTPATIENT PHYSICAL THERAPY LOWER EXTREMITY TREATMENT   Patient Name: Patricia Carrillo MRN: [id number] DOB:11/27/1965, 57 y.o., female Today's Date: 03/07/2023  END OF SESSION:  PT End of Session - 03/07/23 0820     Visit Number 6    Number of Visits 14    Date for PT Re-Evaluation 05/04/23    PT Start Time 0815    Equipment Utilized During Treatment Other (comment)   FWW   Activity Tolerance Patient limited by pain    Behavior During Therapy [facility] for tasks assessed/performed            Past Medical History:  Diagnosis Date   Anxiety    Arthritis    Chronic kidney disease    "stage 3" per pt   Fatigue    Headache    History of kidney stones    Hypertension    Obesity 01/16/2022   takes metformin and ozempic "for weight loss, not diabetic"   Pre-diabetes    Sleep apnea    dx 15 years ago, never used CPAP   Past Surgical History:  Procedure Laterality Date   CHOLECYSTECTOMY     LAPAROSCOPIC HYSTERECTOMY     muiltiple knee arthroscopies bilateral      REVERSE SHOULDER ARTHROPLASTY Left 08/17/2022   Procedure: REVERSE SHOULDER ARTHROPLASTY;  Surgeon: Bjorn Pippin, MD;  Location: [facility];  Service: Orthopedics;  Laterality: Left;   SHOULDER ARTHROSCOPY WITH DISTAL CLAVICLE RESECTION Left 01/26/2022   Procedure: SHOULDER ARTHROSCOPY WITH DISTAL CLAVICLE EXCISION/ DEBRIDEMENT;  Surgeon: Bjorn Pippin, MD;  Location: [facility];  Service: Orthopedics;  Laterality: Left;   SHOULDER ARTHROSCOPY WITH SUBACROMIAL DECOMPRESSION, ROTATOR CUFF REPAIR AND BICEP TENDON REPAIR Left 01/26/2022   Procedure: SHOULDER ARTHROSCOPY WITH SUBACROMIAL DECOMPRESSION, ROTATOR CUFF REPAIR;  Surgeon: Bjorn Pippin, MD;  Location: [facility];  Service: Orthopedics;  Laterality: Left;   TONSILLECTOMY     TOTAL KNEE ARTHROPLASTY Left 02/14/2023   Procedure: TOTAL KNEE ARTHROPLASTY;  Surgeon: Joen Laura, MD;  Location: [facility];  Service:  Orthopedics;  Laterality: Left;   TUBAL LIGATION     Patient Active Problem List   Diagnosis Date Noted   Chronic right shoulder pain 02/08/2023   Lumbar degenerative disc disease 11/30/2021   Status post total shoulder arthroplasty, left 03/14/2021   Primary osteoarthritis of both knees 03/14/2021   REFERRING PROVIDER: Joen Laura, MD   REFERRING DIAG: post op left total knee replacement 02/14/23   THERAPY DIAG:  Acute pain of left knee  Stiffness of left knee, not elsewhere classified  Localized edema  Rationale for Evaluation and Treatment: Rehabilitation  ONSET DATE: 02/14/23  SUBJECTIVE:   SUBJECTIVE STATEMENT: Pt reports 8/10 left knee pain.  Pt ambulates into the facility with [facility] today.   PERTINENT HISTORY: Osteoarthritis, chronic low back pain, anxiety, chronic kidney disease, and hypertension  PAIN:  Are you having pain? Yes: NPRS scale: 8/10 Pain location: left knee and ankle Pain description: burning, sore, and sharp  Aggravating factors: moving her knee and walking Relieving factors: none really   PRECAUTIONS: Fall  WEIGHT BEARING RESTRICTIONS: No  FALLS:  Has patient fallen in last 6 months? Yes. Number of falls 1; her knee buckled in December 2023  PATIENT GOALS: reduced pain and improved mobility  NEXT MD VISIT: 03/01/23  OBJECTIVE:   DIAGNOSTIC FINDINGS: 02/14/23 left knee x-ray IMPRESSION: Left knee arthroplasty without immediate postoperative complication.  PATIENT SURVEYS:  LEFS 1/80  COGNITION: Overall cognitive  status: Within functional limits for tasks assessed     SENSATION: Patient reports tingling from her left knee to her foot.   EDEMA:  Circumferential: Left knee joint line: 46 cm Right knee joint line: 42 cm  PALPATION: TTP: left quadriceps, hamstrings, medial and lateral joint line, and gastroc/soleus  LOWER EXTREMITY ROM:  Active ROM Right eval Left eval Left knee 02/22/23  Hip flexion     Hip extension      Hip abduction     Hip adduction     Hip internal rotation     Hip external rotation     Knee flexion 93 67; limited by pain 58  Knee extension 3 12; limited by pain 0  Ankle dorsiflexion     Ankle plantarflexion     Ankle inversion     Ankle eversion      (Blank rows = not tested)  LOWER EXTREMITY MMT: Not tested due to surgical condition  LOWER EXTREMITY SPECIAL TESTS:  Not tested due to surgical condition  FUNCTIONAL TESTS:  Patient required modA with bed mobility for left lower extremity management  GAIT: Assistive device utilized: Walker - 2 wheeled Level of assistance: Modified independence Comments: Shuffling pattern  TODAY'S TREATMENT:                                                                                                                              DATE:  03/07/23  EXERCISE LOG  Exercise Repetitions and Resistance Comments  Nustep Lvl 3 x 16 mins; seat 8   LAQ AROM x15 reps   Seated Marches AROM x15 reps   Heel slides X25 reps   Supine clams    SLR         Blank cell = exercise not performed today   Manual Therapy Soft Tissue Mobilization: L quad, hip adductors, ITB, calf, HS, light pressure to reduce muscle tightness/guarding and alleviate pain    PATIENT EDUCATION:  Education details: walking, moving her knee, healing, prognosis, plan of care, and goals for therapy Person educated: Patient and Child(ren) Education method: Explanation Education comprehension: verbalized understanding  HOME EXERCISE PROGRAM:  ASSESSMENT:  CLINICAL IMPRESSION: Pt arrives for today's treatment session reporting 8/10 left knee pain.  Pt ambulates into the facility with use of SBQC today and mild antalgic gait.  Pt introduced to seated LAQ and marches today with min/mod cues required to avoid posterior compensatory lean with each rep.  STW/M performed to decrease pain and tone.  Pt reported 7/10 left knee pain at completion of today's treatment session.  OBJECTIVE  IMPAIRMENTS: Abnormal gait, decreased activity tolerance, decreased balance, decreased mobility, difficulty walking, decreased ROM, decreased strength, hypomobility, increased edema, impaired tone, and pain.   ACTIVITY LIMITATIONS: carrying, lifting, standing, squatting, sleeping, stairs, transfers, bed mobility, bathing, dressing, hygiene/grooming, and locomotion level  PARTICIPATION LIMITATIONS: meal prep, cleaning, laundry, driving, shopping, and community activity  PERSONAL FACTORS: Behavior pattern and 3+ comorbidities: Osteoarthritis, chronic low  back pain, anxiety, chronic kidney disease, and hypertension  are also affecting patient's functional outcome.   REHAB POTENTIAL: Good  CLINICAL DECISION MAKING: Evolving/moderate complexity  EVALUATION COMPLEXITY: Moderate  GOALS: Goals reviewed with patient? Yes  SHORT TERM GOALS: Target date: 03/16/23 Patient will be independent with her initial HEP. Baseline: Goal status: MET  2.  Patient will be able to demonstrate at least 90 degrees of left knee flexion for improved knee mobility. Baseline:  Goal status: IN PROGRESS  3.  Patient will be able to demonstrate active left knee extension within 10 degrees of neutral for improved gait mechanics. Baseline:  Goal status: MET  4.  Patient will be able to complete her daily activities without her familiar pain exceeding 8/10. Baseline:  Goal status: IN PROGRESS  5.  Patient will improve her LEFS score to 12/80.  Baseline:  Goal status: IN PROGRESS  LONG TERM GOALS: Target date: 04/06/23  Patient will be independent with her advanced HEP.  Baseline:  Goal status: IN PROGRESS  2.  Patient will be able to demonstrate at least 115 degrees of left knee flexion for improved function navigating stairs. Baseline:  Goal status: IN PROGRESS  3.  Patient will be able to demonstrate active left knee extension within 5 degrees of neutral for improved gait mechanics. Baseline:  Goal  status: MET  4.  Patient will be able to safely navigate at least 80 feet without an assistive device for improved household mobility. Baseline:  Goal status: IN PROGRESS  5.  Patient will be able to navigate at least 4 steps with a reciprocal pattern for improved household mobility. Baseline:  Goal status: IN PROGRESS  6.  Patient will improve her LEFS score to at least 23/80 Baseline:  Goal status: IN PROGRESS  PLAN:  PT FREQUENCY: 2x/week  PT DURATION: other: 7 weeks  PLANNED INTERVENTIONS: Therapeutic exercises, Therapeutic activity, Neuromuscular re-education, Balance training, Gait training, Patient/Family education, Self Care, Joint mobilization, Stair training, Cryotherapy, Moist heat, Manual therapy, and Re-evaluation  PLAN FOR NEXT SESSION: NuStep, quad sets, heel slides, passive range of motion, and manual therapy  Newman Pies, PTA 03/07/2023, 9:18 AM

## 2023-03-09 ENCOUNTER — Ambulatory Visit: Payer: Medicaid Other

## 2023-03-09 DIAGNOSIS — M25662 Stiffness of left knee, not elsewhere classified: Secondary | ICD-10-CM

## 2023-03-09 DIAGNOSIS — R600 Localized edema: Secondary | ICD-10-CM

## 2023-03-09 DIAGNOSIS — M25562 Pain in left knee: Secondary | ICD-10-CM

## 2023-03-09 NOTE — Therapy (Signed)
OUTPATIENT PHYSICAL THERAPY LOWER EXTREMITY TREATMENT   Patient Name: Patricia Carrillo MRN: [id number] DOB:[birth year], 57 y.o., female Today's Date: 03/09/2023  END OF SESSION:  PT End of Session - 03/09/23 0830     Visit Number 7    Number of Visits 14    Date for PT Re-Evaluation 05/04/23    PT Start Time 0827    PT Stop Time 0900    PT Time Calculation (min) 33 min    Equipment Utilized During Treatment Other (comment)   FWW   Activity Tolerance Patient limited by pain    Behavior During Therapy [facility] for tasks assessed/performed            Past Medical History:  Diagnosis Date   Anxiety    Arthritis    Chronic kidney disease    "stage 3" per pt   Fatigue    Headache    History of kidney stones    Hypertension    Obesity 01/16/2022   takes metformin and ozempic "for weight loss, not diabetic"   Pre-diabetes    Sleep apnea    dx 15 years ago, never used CPAP   Past Surgical History:  Procedure Laterality Date   CHOLECYSTECTOMY     LAPAROSCOPIC HYSTERECTOMY     muiltiple knee arthroscopies bilateral      REVERSE SHOULDER ARTHROPLASTY Left 08/17/2022   Procedure: REVERSE SHOULDER ARTHROPLASTY;  Surgeon: Bjorn Pippin, MD;  Location: [facility];  Service: Orthopedics;  Laterality: Left;   SHOULDER ARTHROSCOPY WITH DISTAL CLAVICLE RESECTION Left 01/26/2022   Procedure: SHOULDER ARTHROSCOPY WITH DISTAL CLAVICLE EXCISION/ DEBRIDEMENT;  Surgeon: Bjorn Pippin, MD;  Location: [facility];  Service: Orthopedics;  Laterality: Left;   SHOULDER ARTHROSCOPY WITH SUBACROMIAL DECOMPRESSION, ROTATOR CUFF REPAIR AND BICEP TENDON REPAIR Left 01/26/2022   Procedure: SHOULDER ARTHROSCOPY WITH SUBACROMIAL DECOMPRESSION, ROTATOR CUFF REPAIR;  Surgeon: Bjorn Pippin, MD;  Location:  [facility]ERY [facility];  Service: Orthopedics;  Laterality: Left;   TONSILLECTOMY     TOTAL KNEE ARTHROPLASTY Left 02/14/2023   Procedure: TOTAL KNEE ARTHROPLASTY;  Surgeon:  Joen Laura, MD;  Location: [facility];  Service: Orthopedics;  Laterality: Left;   TUBAL LIGATION     Patient Active Problem List   Diagnosis Date Noted   Chronic right shoulder pain 02/08/2023   Lumbar degenerative disc disease 11/30/2021   Status post total shoulder arthroplasty, left 03/14/2021   Primary osteoarthritis of both knees 03/14/2021   REFERRING PROVIDER: Joen Laura, MD   REFERRING DIAG: post op left total knee replacement 02/14/23   THERAPY DIAG:  Acute pain of left knee  Stiffness of left knee, not elsewhere classified  Localized edema  Rationale for Evaluation and Treatment: Rehabilitation  ONSET DATE: 02/14/23  SUBJECTIVE:   SUBJECTIVE STATEMENT: Pt reports 7/10 left knee pain.  Pt ambulates into the facility with [facility] today.   PERTINENT HISTORY: Osteoarthritis, chronic low back pain, anxiety, chronic kidney disease, and hypertension  PAIN:  Are you having pain? Yes: NPRS scale: 7/10 Pain location: left knee and ankle Pain description: burning, sore, and sharp  Aggravating factors: moving her knee and walking Relieving factors: none really   PRECAUTIONS: Fall  WEIGHT BEARING RESTRICTIONS: No  FALLS:  Has patient fallen in last 6 months? Yes. Number of falls 1; her knee buckled in December 2023  PATIENT GOALS: reduced pain and improved mobility  NEXT MD VISIT: 03/01/23  OBJECTIVE:   DIAGNOSTIC FINDINGS: 02/14/23 left knee x-ray IMPRESSION: Left  knee arthroplasty without immediate postoperative complication.  PATIENT SURVEYS:  LEFS 1/80  COGNITION: Overall cognitive status: Within functional limits for tasks assessed     SENSATION: Patient reports tingling from her left knee to her foot.   EDEMA:  Circumferential: Left knee joint line: 46 cm Right knee joint line: 42 cm  PALPATION: TTP: left quadriceps, hamstrings, medial and lateral joint line, and gastroc/soleus  LOWER EXTREMITY ROM:  Active ROM Right eval Left eval  Left knee 02/22/23  Hip flexion     Hip extension     Hip abduction     Hip adduction     Hip internal rotation     Hip external rotation     Knee flexion 93 67; limited by pain 58  Knee extension 3 12; limited by pain 0  Ankle dorsiflexion     Ankle plantarflexion     Ankle inversion     Ankle eversion      (Blank rows = not tested)  LOWER EXTREMITY MMT: Not tested due to surgical condition  LOWER EXTREMITY SPECIAL TESTS:  Not tested due to surgical condition  FUNCTIONAL TESTS:  Patient required modA with bed mobility for left lower extremity management  GAIT: Assistive device utilized: Walker - 2 wheeled Level of assistance: Modified independence Comments: Shuffling pattern  TODAY'S TREATMENT:                                                                                                                              DATE:  03/09/23  EXERCISE LOG  Exercise Repetitions and Resistance Comments  Nustep Lvl 3 x 16 mins; seat 7   LAQ    Seated Marches    Heel slides to fatigue with over pressure   Supine clams    SLR        Blank cell = exercise not performed today   PATIENT EDUCATION:  Education details: walking, moving her knee, healing, prognosis, plan of care, and goals for therapy Person educated: Patient and Child(ren) Education method: Explanation Education comprehension: verbalized understanding  HOME EXERCISE PROGRAM:  ASSESSMENT:  CLINICAL IMPRESSION: Pt arrives for today's treatment session reporting 7/10 left knee pain.  Pt arrives for today's treatment session 13 minutes late which resulted in abbreviated treatment session.  Pt able to tolerate progression to seat 7 today on Nustep with minimal discomfort.  Treatment concentrated on left knee flexion today.  Pt able to demonstrate 96 degrees of left knee flexion today.  Pt encouraged to continue working on flexion exercises and stretches at home.  Pt reported no change in pain at completion of today's  treatment session.  OBJECTIVE IMPAIRMENTS: Abnormal gait, decreased activity tolerance, decreased balance, decreased mobility, difficulty walking, decreased ROM, decreased strength, hypomobility, increased edema, impaired tone, and pain.   ACTIVITY LIMITATIONS: carrying, lifting, standing, squatting, sleeping, stairs, transfers, bed mobility, bathing, dressing, hygiene/grooming, and locomotion level  PARTICIPATION LIMITATIONS: meal prep, cleaning, laundry, driving, shopping, and community activity  PERSONAL FACTORS: Behavior pattern and 3+ comorbidities: Osteoarthritis, chronic low back pain, anxiety, chronic kidney disease, and hypertension  are also affecting patient's functional outcome.   REHAB POTENTIAL: Good  CLINICAL DECISION MAKING: Evolving/moderate complexity  EVALUATION COMPLEXITY: Moderate  GOALS: Goals reviewed with patient? Yes  SHORT TERM GOALS: Target date: 03/16/23 Patient will be independent with her initial HEP. Baseline: Goal status: MET  2.  Patient will be able to demonstrate at least 90 degrees of left knee flexion for improved knee mobility. Baseline: 5/31: 96 degrees Goal status: MET  3.  Patient will be able to demonstrate active left knee extension within 10 degrees of neutral for improved gait mechanics. Baseline:  Goal status: MET  4.  Patient will be able to complete her daily activities without her familiar pain exceeding 8/10. Baseline:  Goal status: MET  5.  Patient will improve her LEFS score to 12/80.  Baseline:  Goal status: IN PROGRESS  LONG TERM GOALS: Target date: 04/06/23  Patient will be independent with her advanced HEP.  Baseline:  Goal status: IN PROGRESS  2.  Patient will be able to demonstrate at least 115 degrees of left knee flexion for improved function navigating stairs. Baseline:  Goal status: IN PROGRESS  3.  Patient will be able to demonstrate active left knee extension within 5 degrees of neutral for improved gait  mechanics. Baseline:  Goal status: MET  4.  Patient will be able to safely navigate at least 80 feet without an assistive device for improved household mobility. Baseline:  Goal status: IN PROGRESS  5.  Patient will be able to navigate at least 4 steps with a reciprocal pattern for improved household mobility. Baseline:  Goal status: IN PROGRESS  6.  Patient will improve her LEFS score to at least 23/80 Baseline:  Goal status: IN PROGRESS  PLAN:  PT FREQUENCY: 2x/week  PT DURATION: other: 7 weeks  PLANNED INTERVENTIONS: Therapeutic exercises, Therapeutic activity, Neuromuscular re-education, Balance training, Gait training, Patient/Family education, Self Care, Joint mobilization, Stair training, Cryotherapy, Moist heat, Manual therapy, and Re-evaluation  PLAN FOR NEXT SESSION: NuStep, quad sets, heel slides, passive range of motion, and manual therapy  Newman Pies, PTA 03/09/2023, 9:42 AM

## 2023-03-14 ENCOUNTER — Ambulatory Visit: Payer: Medicaid Other

## 2023-03-16 ENCOUNTER — Ambulatory Visit: Payer: Medicaid Other | Attending: Orthopedic Surgery

## 2023-03-16 DIAGNOSIS — M25662 Stiffness of left knee, not elsewhere classified: Secondary | ICD-10-CM | POA: Diagnosis present

## 2023-03-16 DIAGNOSIS — M25562 Pain in left knee: Secondary | ICD-10-CM | POA: Diagnosis present

## 2023-03-16 DIAGNOSIS — R600 Localized edema: Secondary | ICD-10-CM | POA: Insufficient documentation

## 2023-03-16 NOTE — Therapy (Signed)
OUTPATIENT PHYSICAL THERAPY LOWER EXTREMITY TREATMENT   Patient Name: Patricia Carrillo MRN: [id number] DOB:[birth year], 57 y.o., female Today's Date: 03/16/2023  END OF SESSION:  PT End of Session - 03/16/23 0807     Visit Number 8    Number of Visits 14    Date for PT Re-Evaluation 05/04/23    PT Start Time 0800    PT Stop Time 0845    PT Time Calculation (min) 45 min    Equipment Utilized During Treatment Other (comment)   FWW   Activity Tolerance Patient limited by pain    Behavior During Therapy [facility] for tasks assessed/performed            Past Medical History:  Diagnosis Date   Anxiety    Arthritis    Chronic kidney disease    "stage 3" per pt   Fatigue    Headache    History of kidney stones    Hypertension    Obesity 01/16/2022   takes metformin and ozempic "for weight loss, not diabetic"   Pre-diabetes    Sleep apnea    dx 15 years ago, never used CPAP   Past Surgical History:  Procedure Laterality Date   CHOLECYSTECTOMY     LAPAROSCOPIC HYSTERECTOMY     muiltiple knee arthroscopies bilateral      REVERSE SHOULDER ARTHROPLASTY Left 08/17/2022   Procedure: REVERSE SHOULDER ARTHROPLASTY;  Surgeon: Bjorn Pippin, MD;  Location: [facility];  Service: Orthopedics;  Laterality: Left;   SHOULDER ARTHROSCOPY WITH DISTAL CLAVICLE RESECTION Left 01/26/2022   Procedure: SHOULDER ARTHROSCOPY WITH DISTAL CLAVICLE EXCISION/ DEBRIDEMENT;  Surgeon: Bjorn Pippin, MD;  Location: [facility];  Service: Orthopedics;  Laterality: Left;   SHOULDER ARTHROSCOPY WITH SUBACROMIAL DECOMPRESSION, ROTATOR CUFF REPAIR AND BICEP TENDON REPAIR Left 01/26/2022   Procedure: SHOULDER ARTHROSCOPY WITH SUBACROMIAL DECOMPRESSION, ROTATOR CUFF REPAIR;  Surgeon: Bjorn Pippin, MD;  Location: [facility];  Service: Orthopedics;  Laterality: Left;   TONSILLECTOMY     TOTAL KNEE ARTHROPLASTY Left 02/14/2023   Procedure: TOTAL KNEE ARTHROPLASTY;  Surgeon:  Joen Laura, MD;  Location: [facility];  Service: Orthopedics;  Laterality: Left;   TUBAL LIGATION     Patient Active Problem List   Diagnosis Date Noted   Chronic right shoulder pain 02/08/2023   Lumbar degenerative disc disease 11/30/2021   Status post total shoulder arthroplasty, left 03/14/2021   Primary osteoarthritis of both knees 03/14/2021   REFERRING PROVIDER: Joen Laura, MD   REFERRING DIAG: post op left total knee replacement 02/14/23   THERAPY DIAG:  Acute pain of left knee  Stiffness of left knee, not elsewhere classified  Localized edema  Rationale for Evaluation and Treatment: Rehabilitation  ONSET DATE: 02/14/23  SUBJECTIVE:   SUBJECTIVE STATEMENT: Pt reports 6-7/10 left knee pain.  Pt ambulates into the facility with [facility] today.  Pt reports buying under desk stepper to have at home.    PERTINENT HISTORY: Osteoarthritis, chronic low back pain, anxiety, chronic kidney disease, and hypertension  PAIN:  Are you having pain? Yes: NPRS scale: 6-7/10 Pain location: left knee and ankle Pain description: burning, sore, and sharp  Aggravating factors: moving her knee and walking Relieving factors: none really   PRECAUTIONS: Fall  WEIGHT BEARING RESTRICTIONS: No  FALLS:  Has patient fallen in last 6 months? Yes. Number of falls 1; her knee buckled in December 2023  PATIENT GOALS: reduced pain and improved mobility  NEXT MD VISIT: 03/01/23  OBJECTIVE:   DIAGNOSTIC FINDINGS: 02/14/23 left knee x-ray IMPRESSION: Left knee arthroplasty without immediate postoperative complication.  PATIENT SURVEYS:  LEFS 1/80  COGNITION: Overall cognitive status: Within functional limits for tasks assessed     SENSATION: Patient reports tingling from her left knee to her foot.   EDEMA:  Circumferential: Left knee joint line: 46 cm Right knee joint line: 42 cm  PALPATION: TTP: left quadriceps, hamstrings, medial and lateral joint line, and  gastroc/soleus  LOWER EXTREMITY ROM:  Active ROM Right eval Left eval Left knee 02/22/23  Hip flexion     Hip extension     Hip abduction     Hip adduction     Hip internal rotation     Hip external rotation     Knee flexion 93 67; limited by pain 58  Knee extension 3 12; limited by pain 0  Ankle dorsiflexion     Ankle plantarflexion     Ankle inversion     Ankle eversion      (Blank rows = not tested)  LOWER EXTREMITY MMT: Not tested due to surgical condition  LOWER EXTREMITY SPECIAL TESTS:  Not tested due to surgical condition  FUNCTIONAL TESTS:  Patient required modA with bed mobility for left lower extremity management  GAIT: Assistive device utilized: Walker - 2 wheeled Level of assistance: Modified independence Comments: Shuffling pattern  TODAY'S TREATMENT:                                                                                                                              DATE:  03/15/23  EXERCISE LOG  Exercise Repetitions and Resistance Comments  Nustep Lvl 3 x 16 mins; seat 6-5   LAQ 2# x 20 reps   Seated Marches 2# x 20 reps   Heel slides to fatigue with over pressure   Supine clams    SLR        Blank cell = exercise not performed today   Manual Therapy Soft Tissue Mobilization: left knee, STW/M to left knee and calf to decrease pain, tone, and edema    PATIENT EDUCATION:  Education details: walking, moving her knee, healing, prognosis, plan of care, and goals for therapy Person educated: Patient and Child(ren) Education method: Explanation Education comprehension: verbalized understanding  HOME EXERCISE PROGRAM:  ASSESSMENT:  CLINICAL IMPRESSION: Pt arrives for today's treatment session reporting 6-7/10 left knee pain.  Pt reports having a bad day yesterday due to purchasing [employer] for home use and possibly over doing it.  Pt able to tolerate addition of 2# weight today with seated marches and long arc quad.  Pt requiring verbal and  tactile cues to avoid posterior lean with each rep.  STW/M performed to left knee and calf to decrease pain, tone, and edema.  Pt reported 5/10 left knee pain at completion of today's treatment session.  OBJECTIVE IMPAIRMENTS: Abnormal gait, decreased activity tolerance, decreased balance, decreased mobility, difficulty walking, decreased ROM, decreased  strength, hypomobility, increased edema, impaired tone, and pain.   ACTIVITY LIMITATIONS: carrying, lifting, standing, squatting, sleeping, stairs, transfers, bed mobility, bathing, dressing, hygiene/grooming, and locomotion level  PARTICIPATION LIMITATIONS: meal prep, cleaning, laundry, driving, shopping, and community activity  PERSONAL FACTORS: Behavior pattern and 3+ comorbidities: Osteoarthritis, chronic low back pain, anxiety, chronic kidney disease, and hypertension  are also affecting patient's functional outcome.   REHAB POTENTIAL: Good  CLINICAL DECISION MAKING: Evolving/moderate complexity  EVALUATION COMPLEXITY: Moderate  GOALS: Goals reviewed with patient? Yes  SHORT TERM GOALS: Target date: 03/16/23 Patient will be independent with her initial HEP. Baseline: Goal status: MET  2.  Patient will be able to demonstrate at least 90 degrees of left knee flexion for improved knee mobility. Baseline: 5/31: 96 degrees Goal status: MET  3.  Patient will be able to demonstrate active left knee extension within 10 degrees of neutral for improved gait mechanics. Baseline:  Goal status: MET  4.  Patient will be able to complete her daily activities without her familiar pain exceeding 8/10. Baseline:  Goal status: MET  5.  Patient will improve her LEFS score to 12/80.  Baseline:  Goal status: IN PROGRESS  LONG TERM GOALS: Target date: 04/06/23  Patient will be independent with her advanced HEP.  Baseline:  Goal status: IN PROGRESS  2.  Patient will be able to demonstrate at least 115 degrees of left knee flexion for improved  function navigating stairs. Baseline:  Goal status: IN PROGRESS  3.  Patient will be able to demonstrate active left knee extension within 5 degrees of neutral for improved gait mechanics. Baseline:  Goal status: MET  4.  Patient will be able to safely navigate at least 80 feet without an assistive device for improved household mobility. Baseline:  Goal status: IN PROGRESS  5.  Patient will be able to navigate at least 4 steps with a reciprocal pattern for improved household mobility. Baseline:  Goal status: IN PROGRESS  6.  Patient will improve her LEFS score to at least 23/80 Baseline:  Goal status: IN PROGRESS  PLAN:  PT FREQUENCY: 2x/week  PT DURATION: other: 7 weeks  PLANNED INTERVENTIONS: Therapeutic exercises, Therapeutic activity, Neuromuscular re-education, Balance training, Gait training, Patient/Family education, Self Care, Joint mobilization, Stair training, Cryotherapy, Moist heat, Manual therapy, and Re-evaluation  PLAN FOR NEXT SESSION: NuStep, quad sets, heel slides, passive range of motion, and manual therapy  Newman Pies, PTA 03/16/2023, 9:21 AM

## 2023-03-19 ENCOUNTER — Ambulatory Visit: Payer: Medicaid Other

## 2023-03-19 DIAGNOSIS — R600 Localized edema: Secondary | ICD-10-CM

## 2023-03-19 DIAGNOSIS — M25562 Pain in left knee: Secondary | ICD-10-CM

## 2023-03-19 DIAGNOSIS — M25662 Stiffness of left knee, not elsewhere classified: Secondary | ICD-10-CM

## 2023-03-19 NOTE — Therapy (Signed)
OUTPATIENT PHYSICAL THERAPY LOWER EXTREMITY TREATMENT   Patient Name: Patricia Carrillo MRN: [id number] DOB:[birth year], 57 y.o., female Today's Date: 03/19/2023  END OF SESSION:  PT End of Session - 03/19/23 0802     Visit Number 9    Number of Visits 14    Date for PT Re-Evaluation 05/04/23    PT Start Time 0800    PT Stop Time 0845    PT Time Calculation (min) 45 min    Equipment Utilized During Treatment Other (comment)   FWW   Activity Tolerance Patient limited by pain    Behavior During Therapy [facility] for tasks assessed/performed            Past Medical History:  Diagnosis Date   Anxiety    Arthritis    Chronic kidney disease    "stage 3" per pt   Fatigue    Headache    History of kidney stones    Hypertension    Obesity 01/16/2022   takes metformin and ozempic "for weight loss, not diabetic"   Pre-diabetes    Sleep apnea    dx 15 years ago, never used CPAP   Past Surgical History:  Procedure Laterality Date   CHOLECYSTECTOMY     LAPAROSCOPIC HYSTERECTOMY     muiltiple knee arthroscopies bilateral      REVERSE SHOULDER ARTHROPLASTY Left 08/17/2022   Procedure: REVERSE SHOULDER ARTHROPLASTY;  Surgeon: Bjorn Pippin, MD;  Location: [facility];  Service: Orthopedics;  Laterality: Left;   SHOULDER ARTHROSCOPY WITH DISTAL CLAVICLE RESECTION Left 01/26/2022   Procedure: SHOULDER ARTHROSCOPY WITH DISTAL CLAVICLE EXCISION/ DEBRIDEMENT;  Surgeon: Bjorn Pippin, MD;  Location: [facility];  Service: Orthopedics;  Laterality: Left;   SHOULDER ARTHROSCOPY WITH SUBACROMIAL DECOMPRESSION, ROTATOR CUFF REPAIR AND BICEP TENDON REPAIR Left 01/26/2022   Procedure: SHOULDER ARTHROSCOPY WITH SUBACROMIAL DECOMPRESSION, ROTATOR CUFF REPAIR;  Surgeon: Bjorn Pippin, MD;  Location: [facility];  Service: Orthopedics;  Laterality: Left;   TONSILLECTOMY     TOTAL KNEE ARTHROPLASTY Left 02/14/2023   Procedure: TOTAL KNEE ARTHROPLASTY;  Surgeon:  Joen Laura, MD;  Location: [facility];  Service: Orthopedics;  Laterality: Left;   TUBAL LIGATION     Patient Active Problem List   Diagnosis Date Noted   Chronic right shoulder pain 02/08/2023   Lumbar degenerative disc disease 11/30/2021   Status post total shoulder arthroplasty, left 03/14/2021   Primary osteoarthritis of both knees 03/14/2021   REFERRING PROVIDER: Joen Laura, MD   REFERRING DIAG: post op left total knee replacement 02/14/23   THERAPY DIAG:  Acute pain of left knee  Stiffness of left knee, not elsewhere classified  Localized edema  Rationale for Evaluation and Treatment: Rehabilitation  ONSET DATE: 02/14/23  SUBJECTIVE:   SUBJECTIVE STATEMENT: Pt reports 4-5/10 left knee pain.    PERTINENT HISTORY: Osteoarthritis, chronic low back pain, anxiety, chronic kidney disease, and hypertension  PAIN:  Are you having pain? Yes: NPRS scale: 4-5/10 Pain location: left knee and ankle Pain description: burning, sore, and sharp  Aggravating factors: moving her knee and walking Relieving factors: none really   PRECAUTIONS: Fall  WEIGHT BEARING RESTRICTIONS: No  FALLS:  Has patient fallen in last 6 months? Yes. Number of falls 1; her knee buckled in December 2023  PATIENT GOALS: reduced pain and improved mobility  NEXT MD VISIT: 03/01/23  OBJECTIVE:   DIAGNOSTIC FINDINGS: 02/14/23 left knee x-ray IMPRESSION: Left knee arthroplasty without immediate postoperative complication.  PATIENT  SURVEYS:  LEFS 1/80  COGNITION: Overall cognitive status: Within functional limits for tasks assessed     SENSATION: Patient reports tingling from her left knee to her foot.   EDEMA:  Circumferential: Left knee joint line: 46 cm Right knee joint line: 42 cm  PALPATION: TTP: left quadriceps, hamstrings, medial and lateral joint line, and gastroc/soleus  LOWER EXTREMITY ROM:  Active ROM Right eval Left eval Left knee 02/22/23  Hip flexion     Hip  extension     Hip abduction     Hip adduction     Hip internal rotation     Hip external rotation     Knee flexion 93 67; limited by pain 58  Knee extension 3 12; limited by pain 0  Ankle dorsiflexion     Ankle plantarflexion     Ankle inversion     Ankle eversion      (Blank rows = not tested)  LOWER EXTREMITY MMT: Not tested due to surgical condition  LOWER EXTREMITY SPECIAL TESTS:  Not tested due to surgical condition  FUNCTIONAL TESTS:  Patient required modA with bed mobility for left lower extremity management  GAIT: Assistive device utilized: Walker - 2 wheeled Level of assistance: Modified independence Comments: Shuffling pattern  TODAY'S TREATMENT:                                                                                                                              DATE:  03/19/23  EXERCISE LOG  Exercise Repetitions and Resistance Comments  Nustep Lvl 3 x 16 mins; seat 6-5   Lunges 8" box x 3 mins   Forward Step Ups 6" box x 20 reps   Rockerboard 3 mins   LAQ 2# x 25 reps   Seated Marches 2# x 25 reps   Ham Curls Red x 20 reps   Seated Clams Red x 20 reps   SLR        Blank cell = exercise not performed today     PATIENT EDUCATION:  Education details: walking, moving her knee, healing, prognosis, plan of care, and goals for therapy Person educated: Patient and Child(ren) Education method: Explanation Education comprehension: verbalized understanding  HOME EXERCISE PROGRAM:  ASSESSMENT:  CLINICAL IMPRESSION: Pt arrives for today's treatment session 4-5/10 left knee pain.  Pt introduced to standing exercises today with good results.  Pt requiring min cues for proper technique and sequencing.  Pt denied any discomfort after new exercises, but does report fatigue.  Pt able to tolerate increased reps with all seated exercises today as well.  Pt denied any change in pain at completion of today's treatment session.  OBJECTIVE IMPAIRMENTS: Abnormal gait,  decreased activity tolerance, decreased balance, decreased mobility, difficulty walking, decreased ROM, decreased strength, hypomobility, increased edema, impaired tone, and pain.   ACTIVITY LIMITATIONS: carrying, lifting, standing, squatting, sleeping, stairs, transfers, bed mobility, bathing, dressing, hygiene/grooming, and locomotion level  PARTICIPATION LIMITATIONS: meal prep, cleaning, laundry,  driving, shopping, and community activity  PERSONAL FACTORS: Behavior pattern and 3+ comorbidities: Osteoarthritis, chronic low back pain, anxiety, chronic kidney disease, and hypertension  are also affecting patient's functional outcome.   REHAB POTENTIAL: Good  CLINICAL DECISION MAKING: Evolving/moderate complexity  EVALUATION COMPLEXITY: Moderate  GOALS: Goals reviewed with patient? Yes  SHORT TERM GOALS: Target date: 03/16/23 Patient will be independent with her initial HEP. Baseline: Goal status: MET  2.  Patient will be able to demonstrate at least 90 degrees of left knee flexion for improved knee mobility. Baseline: 5/31: 96 degrees Goal status: MET  3.  Patient will be able to demonstrate active left knee extension within 10 degrees of neutral for improved gait mechanics. Baseline:  Goal status: MET  4.  Patient will be able to complete her daily activities without her familiar pain exceeding 8/10. Baseline:  Goal status: MET  5.  Patient will improve her LEFS score to 12/80.  Baseline:  Goal status: IN PROGRESS  LONG TERM GOALS: Target date: 04/06/23  Patient will be independent with her advanced HEP.  Baseline:  Goal status: IN PROGRESS  2.  Patient will be able to demonstrate at least 115 degrees of left knee flexion for improved function navigating stairs. Baseline:  Goal status: IN PROGRESS  3.  Patient will be able to demonstrate active left knee extension within 5 degrees of neutral for improved gait mechanics. Baseline:  Goal status: MET  4.  Patient will  be able to safely navigate at least 80 feet without an assistive device for improved household mobility. Baseline:  Goal status: IN PROGRESS  5.  Patient will be able to navigate at least 4 steps with a reciprocal pattern for improved household mobility. Baseline:  Goal status: IN PROGRESS  6.  Patient will improve her LEFS score to at least 23/80 Baseline:  Goal status: IN PROGRESS  PLAN:  PT FREQUENCY: 2x/week  PT DURATION: other: 7 weeks  PLANNED INTERVENTIONS: Therapeutic exercises, Therapeutic activity, Neuromuscular re-education, Balance training, Gait training, Patient/Family education, Self Care, Joint mobilization, Stair training, Cryotherapy, Moist heat, Manual therapy, and Re-evaluation  PLAN FOR NEXT SESSION: NuStep, quad sets, heel slides, passive range of motion, and manual therapy  Newman Pies, PTA 03/19/2023, 9:40 AM

## 2023-03-21 ENCOUNTER — Ambulatory Visit: Payer: Medicaid Other

## 2023-03-21 DIAGNOSIS — M25562 Pain in left knee: Secondary | ICD-10-CM

## 2023-03-21 DIAGNOSIS — M25662 Stiffness of left knee, not elsewhere classified: Secondary | ICD-10-CM

## 2023-03-21 DIAGNOSIS — R600 Localized edema: Secondary | ICD-10-CM

## 2023-03-21 NOTE — Therapy (Addendum)
OUTPATIENT PHYSICAL THERAPY LOWER EXTREMITY TREATMENT   Patient Name: Patricia Carrillo MRN: [id number] DOB:[birth year], 57 y.o., female Today's Date: 03/21/2023  END OF SESSION:  PT End of Session - 03/21/23 0806     Visit Number 10    Number of Visits 14    Date for PT Re-Evaluation 05/04/23    PT Start Time 0800    PT Stop Time 0907    PT Time Calculation (min) 67 min    Equipment Utilized During Treatment Other (comment)   FWW   Activity Tolerance Patient limited by pain    Behavior During Therapy [facility] for tasks assessed/performed            Past Medical History:  Diagnosis Date   Anxiety    Arthritis    Chronic kidney disease    "stage 3" per pt   Fatigue    Headache    History of kidney stones    Hypertension    Obesity 01/16/2022   takes metformin and ozempic "for weight loss, not diabetic"   Pre-diabetes    Sleep apnea    dx 15 years ago, never used CPAP   Past Surgical History:  Procedure Laterality Date   CHOLECYSTECTOMY     LAPAROSCOPIC HYSTERECTOMY     muiltiple knee arthroscopies bilateral      REVERSE SHOULDER ARTHROPLASTY Left 08/17/2022   Procedure: REVERSE SHOULDER ARTHROPLASTY;  Surgeon: Bjorn Pippin, MD;  Location: [facility];  Service: Orthopedics;  Laterality: Left;   SHOULDER ARTHROSCOPY WITH DISTAL CLAVICLE RESECTION Left 01/26/2022   Procedure: SHOULDER ARTHROSCOPY WITH DISTAL CLAVICLE EXCISION/ DEBRIDEMENT;  Surgeon: Bjorn Pippin, MD;  Location: [facility];  Service: Orthopedics;  Laterality: Left;   SHOULDER ARTHROSCOPY WITH SUBACROMIAL DECOMPRESSION, ROTATOR CUFF REPAIR AND BICEP TENDON REPAIR Left 01/26/2022   Procedure: SHOULDER ARTHROSCOPY WITH SUBACROMIAL DECOMPRESSION, ROTATOR CUFF REPAIR;  Surgeon: Bjorn Pippin, MD;  Location: [facility];  Service: Orthopedics;  Laterality: Left;   TONSILLECTOMY     TOTAL KNEE ARTHROPLASTY Left 02/14/2023   Procedure: TOTAL KNEE ARTHROPLASTY;  Surgeon:  Joen Laura, MD;  Location: [facility];  Service: Orthopedics;  Laterality: Left;   TUBAL LIGATION     Patient Active Problem List   Diagnosis Date Noted   Chronic right shoulder pain 02/08/2023   Lumbar degenerative disc disease 11/30/2021   Status post total shoulder arthroplasty, left 03/14/2021   Primary osteoarthritis of both knees 03/14/2021   REFERRING PROVIDER: Joen Laura, MD   REFERRING DIAG: post op left total knee replacement 02/14/23   THERAPY DIAG:  Acute pain of left knee  Stiffness of left knee, not elsewhere classified  Localized edema  Rationale for Evaluation and Treatment: Rehabilitation  ONSET DATE: 02/14/23  SUBJECTIVE:   SUBJECTIVE STATEMENT: Pt reports 5/10 left knee pain and difficulty sleeping last night.     PERTINENT HISTORY: Osteoarthritis, chronic low back pain, anxiety, chronic kidney disease, and hypertension  PAIN:  Are you having pain? Yes: NPRS scale: 5/10 Pain location: left knee and ankle Pain description: burning, sore, and sharp  Aggravating factors: moving her knee and walking Relieving factors: none really   PRECAUTIONS: Fall  WEIGHT BEARING RESTRICTIONS: No  FALLS:  Has patient fallen in last 6 months? Yes. Number of falls 1; her knee buckled in December 2023  PATIENT GOALS: reduced pain and improved mobility  NEXT MD VISIT: 03/01/23  OBJECTIVE:   DIAGNOSTIC FINDINGS: 02/14/23 left knee x-ray IMPRESSION: Left knee arthroplasty  without immediate postoperative complication.  PATIENT SURVEYS:  LEFS 1/80  COGNITION: Overall cognitive status: Within functional limits for tasks assessed     SENSATION: Patient reports tingling from her left knee to her foot.   EDEMA:  Circumferential: Left knee joint line: 46 cm Right knee joint line: 42 cm  PALPATION: TTP: left quadriceps, hamstrings, medial and lateral joint line, and gastroc/soleus  LOWER EXTREMITY ROM:  Active ROM Right eval Left eval Left  knee 02/22/23  Hip flexion     Hip extension     Hip abduction     Hip adduction     Hip internal rotation     Hip external rotation     Knee flexion 93 67; limited by pain 58  Knee extension 3 12; limited by pain 0  Ankle dorsiflexion     Ankle plantarflexion     Ankle inversion     Ankle eversion      (Blank rows = not tested)  LOWER EXTREMITY MMT: Not tested due to surgical condition  LOWER EXTREMITY SPECIAL TESTS:  Not tested due to surgical condition  FUNCTIONAL TESTS:  Patient required modA with bed mobility for left lower extremity management  GAIT: Assistive device utilized: Walker - 2 wheeled Level of assistance: Modified independence Comments: Shuffling pattern  TODAY'S TREATMENT:                                                                                                                              DATE:  03/21/23  EXERCISE LOG  Exercise Repetitions and Resistance Comments  Nustep Lvl 3 x 16 mins; seat 5   Lunges 8" box x 4 mins   Forward Step Ups 6" box x 20 reps   Rockerboard 4 mins   LAQ 2# x 30 reps   Seated Marches 2# x 30 reps   Ham Curls Red x 20 reps   Seated Clams Red x 20 reps   Heel Slides  5 min with over pressure       Blank cell = exercise not performed today     PATIENT EDUCATION:  Education details: walking, moving her knee, healing, prognosis, plan of care, and goals for therapy Person educated: Patient and Child(ren) Education method: Explanation Education comprehension: verbalized understanding  HOME EXERCISE PROGRAM:  ASSESSMENT:  CLINICAL IMPRESSION: Pt arrives for today's treatment session reporting 5/10 left knee pain.  Pt increased score to 35/80 on LEFS meeting both her short term and long term goals.  Pt able to demonstrate reciprocal pattern ascending and descending four steps with use of one hand rail.  Pt also able to demonstrate 106 degrees of active left knee flexion.  Pt able to tolerate increased reps with all  exercises today with limited discomfort and fatigue.  Pt reported decreased left knee pain at completion of today's treatment session.  03/21/23 PROGRESS REPORT:  Patient is making good progress with skilled physical therapy as evidenced by her objective measures,  functional mobility, and progress toward her goals. She has met all of her short term goals for therapy at this time. However, she continues to exhibit reduced left knee flexion compared to the right knee. Recommend that she continue with her current plan of care to address her remaining impairments to return to her prior level of function.   Candi Leash, PT, DPT   OBJECTIVE IMPAIRMENTS: Abnormal gait, decreased activity tolerance, decreased balance, decreased mobility, difficulty walking, decreased ROM, decreased strength, hypomobility, increased edema, impaired tone, and pain.   ACTIVITY LIMITATIONS: carrying, lifting, standing, squatting, sleeping, stairs, transfers, bed mobility, bathing, dressing, hygiene/grooming, and locomotion level  PARTICIPATION LIMITATIONS: meal prep, cleaning, laundry, driving, shopping, and community activity  PERSONAL FACTORS: Behavior pattern and 3+ comorbidities: Osteoarthritis, chronic low back pain, anxiety, chronic kidney disease, and hypertension  are also affecting patient's functional outcome.   REHAB POTENTIAL: Good  CLINICAL DECISION MAKING: Evolving/moderate complexity  EVALUATION COMPLEXITY: Moderate  GOALS: Goals reviewed with patient? Yes  SHORT TERM GOALS: Target date: 03/16/23 Patient will be independent with her initial HEP. Baseline: Goal status: MET  2.  Patient will be able to demonstrate at least 90 degrees of left knee flexion for improved knee mobility. Baseline: 5/31: 96 degrees Goal status: MET  3.  Patient will be able to demonstrate active left knee extension within 10 degrees of neutral for improved gait mechanics. Baseline:  Goal status: MET  4.  Patient will be  able to complete her daily activities without her familiar pain exceeding 8/10. Baseline:  Goal status: MET  5.  Patient will improve her LEFS score to 12/80.  Baseline: 6/12: 35/80 Goal status: MET  LONG TERM GOALS: Target date: 04/06/23  Patient will be independent with her advanced HEP.  Baseline:  Goal status: IN PROGRESS  2.  Patient will be able to demonstrate at least 115 degrees of left knee flexion for improved function navigating stairs. Baseline: 6/12: 106 degrees Goal status: IN PROGRESS  3.  Patient will be able to demonstrate active left knee extension within 5 degrees of neutral for improved gait mechanics. Baseline:  Goal status: MET  4.  Patient will be able to safely navigate at least 80 feet without an assistive device for improved household mobility. Baseline:  Goal status: MET  5.  Patient will be able to navigate at least 4 steps with a reciprocal pattern for improved household mobility. Baseline:  Goal status: MET  6.  Patient will improve her LEFS score to at least 23/80 Baseline: 6/12: 35/80 Goal status: MET  PLAN:  PT FREQUENCY: 2x/week  PT DURATION: other: 7 weeks  PLANNED INTERVENTIONS: Therapeutic exercises, Therapeutic activity, Neuromuscular re-education, Balance training, Gait training, Patient/Family education, Self Care, Joint mobilization, Stair training, Cryotherapy, Moist heat, Manual therapy, and Re-evaluation  PLAN FOR NEXT SESSION: NuStep, quad sets, heel slides, passive range of motion, and manual therapy  Newman Pies, PTA 03/21/2023, 9:15 AM

## 2023-03-22 ENCOUNTER — Ambulatory Visit: Payer: Medicaid Other | Admitting: Sports Medicine

## 2023-03-28 ENCOUNTER — Ambulatory Visit: Payer: Medicaid Other

## 2023-03-28 DIAGNOSIS — R600 Localized edema: Secondary | ICD-10-CM

## 2023-03-28 DIAGNOSIS — M25662 Stiffness of left knee, not elsewhere classified: Secondary | ICD-10-CM

## 2023-03-28 DIAGNOSIS — M25562 Pain in left knee: Secondary | ICD-10-CM | POA: Diagnosis not present

## 2023-03-28 NOTE — Therapy (Signed)
OUTPATIENT PHYSICAL THERAPY LOWER EXTREMITY TREATMENT   Patient Name: Patricia Carrillo MRN: [id number] DOB:Oct 31, 1965, 57 y.o., female Today's Date: 03/28/2023  END OF SESSION:  PT End of Session - 03/28/23 0811     Visit Number 11    Number of Visits 14    Date for PT Re-Evaluation 05/04/23    PT Start Time 0800    PT Stop Time 0853    PT Time Calculation (min) 53 min    Equipment Utilized During Treatment Other (comment)   FWW   Activity Tolerance Patient limited by pain    Behavior During Therapy [facility] for tasks assessed/performed            Past Medical History:  Diagnosis Date   Anxiety    Arthritis    Chronic kidney disease    "stage 3" per pt   Fatigue    Headache    History of kidney stones    Hypertension    Obesity 01/16/2022   takes metformin and ozempic "for weight loss, not diabetic"   Pre-diabetes    Sleep apnea    dx 15 years ago, never used CPAP   Past Surgical History:  Procedure Laterality Date   CHOLECYSTECTOMY     LAPAROSCOPIC HYSTERECTOMY     muiltiple knee arthroscopies bilateral      REVERSE SHOULDER ARTHROPLASTY Left 08/17/2022   Procedure: REVERSE SHOULDER ARTHROPLASTY;  Surgeon: Bjorn Pippin, MD;  Location: [facility];  Service: Orthopedics;  Laterality: Left;   SHOULDER ARTHROSCOPY WITH DISTAL CLAVICLE RESECTION Left 01/26/2022   Procedure: SHOULDER ARTHROSCOPY WITH DISTAL CLAVICLE EXCISION/ DEBRIDEMENT;  Surgeon: Bjorn Pippin, MD;  Location: [facility];  Service: Orthopedics;  Laterality: Left;   SHOULDER ARTHROSCOPY WITH SUBACROMIAL DECOMPRESSION, ROTATOR CUFF REPAIR AND BICEP TENDON REPAIR Left 01/26/2022   Procedure: SHOULDER ARTHROSCOPY WITH SUBACROMIAL DECOMPRESSION, ROTATOR CUFF REPAIR;  Surgeon: Bjorn Pippin, MD;  Location: [facility];  Service: Orthopedics;  Laterality: Left;   TONSILLECTOMY     TOTAL KNEE ARTHROPLASTY Left 02/14/2023   Procedure: TOTAL KNEE ARTHROPLASTY;  Surgeon:  Joen Laura, MD;  Location: [facility];  Service: Orthopedics;  Laterality: Left;   TUBAL LIGATION     Patient Active Problem List   Diagnosis Date Noted   Chronic right shoulder pain 02/08/2023   Lumbar degenerative disc disease 11/30/2021   Status post total shoulder arthroplasty, left 03/14/2021   Primary osteoarthritis of both knees 03/14/2021   REFERRING PROVIDER: Joen Laura, MD   REFERRING DIAG: post op left total knee replacement 02/14/23   THERAPY DIAG:  Acute pain of left knee  Stiffness of left knee, not elsewhere classified  Localized edema  Rationale for Evaluation and Treatment: Rehabilitation  ONSET DATE: 02/14/23  SUBJECTIVE:   SUBJECTIVE STATEMENT: Pt reports 3/10 left knee pain today.  Pt ambulates into facility without AD today.     PERTINENT HISTORY: Osteoarthritis, chronic low back pain, anxiety, chronic kidney disease, and hypertension  PAIN:  Are you having pain? Yes: NPRS scale: 3/10 Pain location: left knee and ankle Pain description: burning, sore, and sharp  Aggravating factors: moving her knee and walking Relieving factors: none really   PRECAUTIONS: Fall  WEIGHT BEARING RESTRICTIONS: No  FALLS:  Has patient fallen in last 6 months? Yes. Number of falls 1; her knee buckled in December 2023  PATIENT GOALS: reduced pain and improved mobility  NEXT MD VISIT: 03/01/23  OBJECTIVE:   DIAGNOSTIC FINDINGS: 02/14/23 left knee x-ray  IMPRESSION: Left knee arthroplasty without immediate postoperative complication.  PATIENT SURVEYS:  LEFS 1/80  COGNITION: Overall cognitive status: Within functional limits for tasks assessed     SENSATION: Patient reports tingling from her left knee to her foot.   EDEMA:  Circumferential: Left knee joint line: 46 cm Right knee joint line: 42 cm  PALPATION: TTP: left quadriceps, hamstrings, medial and lateral joint line, and gastroc/soleus  LOWER EXTREMITY ROM:  Active ROM Right eval  Left eval Left knee 02/22/23  Hip flexion     Hip extension     Hip abduction     Hip adduction     Hip internal rotation     Hip external rotation     Knee flexion 93 67; limited by pain 58  Knee extension 3 12; limited by pain 0  Ankle dorsiflexion     Ankle plantarflexion     Ankle inversion     Ankle eversion      (Blank rows = not tested)  LOWER EXTREMITY MMT: Not tested due to surgical condition  LOWER EXTREMITY SPECIAL TESTS:  Not tested due to surgical condition  FUNCTIONAL TESTS:  Patient required modA with bed mobility for left lower extremity management  GAIT: Assistive device utilized: Walker - 2 wheeled Level of assistance: Modified independence Comments: Shuffling pattern  TODAY'S TREATMENT:                                                                                                                              DATE:  03/28/23  EXERCISE LOG  Exercise Repetitions and Resistance Comments  Nustep Lvl 4 x 16 mins; seat 5   Lunges 8" box x 5 mins   Forward Step Ups 6" box x 25 reps   Rockerboard 4 mins   LAQ 2# x 30 reps   Seated Marches 2# x 30 reps   Ham Curls Red x 25 reps   Seated Clams Red x 25 reps   Heel Slides  5 min with over pressure       Blank cell = exercise not performed today     PATIENT EDUCATION:  Education details: walking, moving her knee, healing, prognosis, plan of care, and goals for therapy Person educated: Patient and Child(ren) Education method: Explanation Education comprehension: verbalized understanding  HOME EXERCISE PROGRAM:  ASSESSMENT:  CLINICAL IMPRESSION: Pt arrives for today's treatment session reporting 3/10 left knee.  Pt able to tolerate increased time or reps with several exercises today without discomfort.  Pt encouraged to continue concentration on flexion exercises at home.  Pt would benefit from increased time spent on ROM at next treatment session. Pt reported no change in pain at completion of today's  treatment session.  OBJECTIVE IMPAIRMENTS: Abnormal gait, decreased activity tolerance, decreased balance, decreased mobility, difficulty walking, decreased ROM, decreased strength, hypomobility, increased edema, impaired tone, and pain.   ACTIVITY LIMITATIONS: carrying, lifting, standing, squatting, sleeping, stairs, transfers, bed mobility, bathing, dressing, hygiene/grooming, and  locomotion level  PARTICIPATION LIMITATIONS: meal prep, cleaning, laundry, driving, shopping, and community activity  PERSONAL FACTORS: Behavior pattern and 3+ comorbidities: Osteoarthritis, chronic low back pain, anxiety, chronic kidney disease, and hypertension  are also affecting patient's functional outcome.   REHAB POTENTIAL: Good  CLINICAL DECISION MAKING: Evolving/moderate complexity  EVALUATION COMPLEXITY: Moderate  GOALS: Goals reviewed with patient? Yes  SHORT TERM GOALS: Target date: 03/16/23 Patient will be independent with her initial HEP. Baseline: Goal status: MET  2.  Patient will be able to demonstrate at least 90 degrees of left knee flexion for improved knee mobility. Baseline: 5/31: 96 degrees Goal status: MET  3.  Patient will be able to demonstrate active left knee extension within 10 degrees of neutral for improved gait mechanics. Baseline:  Goal status: MET  4.  Patient will be able to complete her daily activities without her familiar pain exceeding 8/10. Baseline:  Goal status: MET  5.  Patient will improve her LEFS score to 12/80.  Baseline: 6/12: 35/80 Goal status: MET  LONG TERM GOALS: Target date: 04/06/23  Patient will be independent with her advanced HEP.  Baseline:  Goal status: IN PROGRESS  2.  Patient will be able to demonstrate at least 115 degrees of left knee flexion for improved function navigating stairs. Baseline: 6/12: 106 degrees Goal status: IN PROGRESS  3.  Patient will be able to demonstrate active left knee extension within 5 degrees of neutral  for improved gait mechanics. Baseline:  Goal status: MET  4.  Patient will be able to safely navigate at least 80 feet without an assistive device for improved household mobility. Baseline:  Goal status: MET  5.  Patient will be able to navigate at least 4 steps with a reciprocal pattern for improved household mobility. Baseline:  Goal status: MET  6.  Patient will improve her LEFS score to at least 23/80 Baseline: 6/12: 35/80 Goal status: MET  PLAN:  PT FREQUENCY: 2x/week  PT DURATION: other: 7 weeks  PLANNED INTERVENTIONS: Therapeutic exercises, Therapeutic activity, Neuromuscular re-education, Balance training, Gait training, Patient/Family education, Self Care, Joint mobilization, Stair training, Cryotherapy, Moist heat, Manual therapy, and Re-evaluation  PLAN FOR NEXT SESSION: NuStep, quad sets, heel slides, passive range of motion, and manual therapy  Newman Pies, PTA 03/28/2023, 9:43 AM

## 2023-04-04 ENCOUNTER — Ambulatory Visit: Payer: Medicaid Other | Admitting: Physical Therapy

## 2023-04-04 ENCOUNTER — Encounter: Payer: Self-pay | Admitting: Physical Therapy

## 2023-04-04 DIAGNOSIS — M25662 Stiffness of left knee, not elsewhere classified: Secondary | ICD-10-CM

## 2023-04-04 DIAGNOSIS — M25562 Pain in left knee: Secondary | ICD-10-CM

## 2023-04-04 DIAGNOSIS — R600 Localized edema: Secondary | ICD-10-CM

## 2023-04-04 NOTE — Therapy (Addendum)
OUTPATIENT PHYSICAL THERAPY LOWER EXTREMITY TREATMENT   Patient Name: Patricia Carrillo MRN: [id number] DOB:[birth year], 57 y.o., female Today's Date: 04/04/2023  END OF SESSION:  PT End of Session - 04/04/23 0801     Visit Number 12    Number of Visits 14    Date for PT Re-Evaluation 05/04/23    PT Start Time 0802    PT Stop Time 0849    PT Time Calculation (min) 47 min    Activity Tolerance Patient tolerated treatment well    Behavior During Therapy [facility] for tasks assessed/performed            Past Medical History:  Diagnosis Date   Anxiety    Arthritis    Chronic kidney disease    "stage 3" per pt   Fatigue    Headache    History of kidney stones    Hypertension    Obesity 01/16/2022   takes metformin and ozempic "for weight loss, not diabetic"   Pre-diabetes    Sleep apnea    dx 15 years ago, never used CPAP   Past Surgical History:  Procedure Laterality Date   CHOLECYSTECTOMY     LAPAROSCOPIC HYSTERECTOMY     muiltiple knee arthroscopies bilateral      REVERSE SHOULDER ARTHROPLASTY Left 08/17/2022   Procedure: REVERSE SHOULDER ARTHROPLASTY;  Surgeon: Bjorn Pippin, MD;  Location: [facility];  Service: Orthopedics;  Laterality: Left;   SHOULDER ARTHROSCOPY WITH DISTAL CLAVICLE RESECTION Left 01/26/2022   Procedure: SHOULDER ARTHROSCOPY WITH DISTAL CLAVICLE EXCISION/ DEBRIDEMENT;  Surgeon: Bjorn Pippin, MD;  Location: [facility];  Service: Orthopedics;  Laterality: Left;   SHOULDER ARTHROSCOPY WITH SUBACROMIAL DECOMPRESSION, ROTATOR CUFF REPAIR AND BICEP TENDON REPAIR Left 01/26/2022   Procedure: SHOULDER ARTHROSCOPY WITH SUBACROMIAL DECOMPRESSION, ROTATOR CUFF REPAIR;  Surgeon: Bjorn Pippin, MD;  Location: [facility];  Service: Orthopedics;  Laterality: Left;   TONSILLECTOMY     TOTAL KNEE ARTHROPLASTY Left 02/14/2023   Procedure: TOTAL KNEE ARTHROPLASTY;  Surgeon: Joen Laura, MD;  Location: [facility];  Service:  Orthopedics;  Laterality: Left;   TUBAL LIGATION     Patient Active Problem List   Diagnosis Date Noted   Chronic right shoulder pain 02/08/2023   Lumbar degenerative disc disease 11/30/2021   Status post total shoulder arthroplasty, left 03/14/2021   Primary osteoarthritis of both knees 03/14/2021   REFERRING PROVIDER: Joen Laura, MD   REFERRING DIAG: post op left total knee replacement 02/14/23   THERAPY DIAG:  Acute pain of left knee  Stiffness of left knee, not elsewhere classified  Localized edema  Rationale for Evaluation and Treatment: Rehabilitation  ONSET DATE: 02/14/23  SUBJECTIVE:   SUBJECTIVE STATEMENT: Reports that she now is having more issues with R knee where L knee is doing good.  PERTINENT HISTORY: Osteoarthritis, chronic low back pain, anxiety, chronic kidney disease, and hypertension  PAIN:  Are you having pain? Yes: NPRS scale: 2/10 Pain location: left knee and ankle Pain description: burning, sore, and sharp  Aggravating factors: moving her knee and walking Relieving factors: none really   PRECAUTIONS: Fall  WEIGHT BEARING RESTRICTIONS: No  FALLS:  Has patient fallen in last 6 months? Yes. Number of falls 1; her knee buckled in December 2023  PATIENT GOALS: reduced pain and improved mobility  NEXT MD VISIT:  OBJECTIVE:   DIAGNOSTIC FINDINGS: 02/14/23 left knee x-ray IMPRESSION: Left knee arthroplasty without immediate postoperative complication.  PATIENT SURVEYS:  LEFS  1/80  COGNITION: Overall cognitive status: Within functional limits for tasks assessed     SENSATION: Patient reports tingling from her left knee to her foot.   EDEMA:  Circumferential: Left knee joint line: 46 cm Right knee joint line: 42 cm  PALPATION: TTP: left quadriceps, hamstrings, medial and lateral joint line, and gastroc/soleus  LOWER EXTREMITY ROM:  Active ROM Right eval Left eval Left knee 02/22/23 Left AROM 04/04/23  Hip flexion      Hip  extension      Hip abduction      Hip adduction      Hip internal rotation      Hip external rotation      Knee flexion 93 67; limited by pain 58 100  Knee extension 3 12; limited by pain 0   Ankle dorsiflexion      Ankle plantarflexion      Ankle inversion      Ankle eversion       (Blank rows = not tested)  LOWER EXTREMITY MMT: Not tested due to surgical condition  LOWER EXTREMITY SPECIAL TESTS:  Not tested due to surgical condition  FUNCTIONAL TESTS:  Patient required modA with bed mobility for left lower extremity management  GAIT: Assistive device utilized: None Level of assistance: Complete Independence Comments: Shuffling pattern  TODAY'S TREATMENT:                                                                                                                              DATE:  04/04/23  EXERCISE LOG  Exercise Repetitions and Resistance Comments  Nustep Lvl 5 x 17 mins; seat 5   Lunges 8" box x 5 mins   Forward Step Ups 6" box x 20 reps   LAQ 2# x 30 reps       Blank cell = exercise not performed today     PATIENT EDUCATION:  Education details: walking, moving her knee, healing, prognosis, plan of care, and goals for therapy Person educated: Patient and Child(ren) Education method: Explanation Education comprehension: verbalized understanding  HOME EXERCISE PROGRAM:  ASSESSMENT:  CLINICAL IMPRESSION: Patient presented in clinic with reports of more R knee pain than L knee. Patient happy with ability to ambulate in store recently without use of the motorized cart. Patient plans to get R knee replaced possibly this fall. Patient able to tolerate therex well with emphasis on ROM and quad strength. Patient's AROM measurement has improved but not yet goal status. More education provided during treatment of continuation of exercises daily and cryotherapy as needed.   PHYSICAL THERAPY DISCHARGE SUMMARY  Visits from Start of Care: 12  Current functional level  related to goals / functional outcomes: Patient was able to meet most of her goals for skilled physical therapy. However, she was unable to meet her goal for improved left knee flexion.    Remaining deficits: Left knee flexion AROM    Education / Equipment: HEP   Patient  agrees to discharge. Patient goals were partially met. Patient is being discharged due to being pleased with the current functional level.  Candi Leash, PT, DPT    OBJECTIVE IMPAIRMENTS: Abnormal gait, decreased activity tolerance, decreased balance, decreased mobility, difficulty walking, decreased ROM, decreased strength, hypomobility, increased edema, impaired tone, and pain.   ACTIVITY LIMITATIONS: carrying, lifting, standing, squatting, sleeping, stairs, transfers, bed mobility, bathing, dressing, hygiene/grooming, and locomotion level  PARTICIPATION LIMITATIONS: meal prep, cleaning, laundry, driving, shopping, and community activity  PERSONAL FACTORS: Behavior pattern and 3+ comorbidities: Osteoarthritis, chronic low back pain, anxiety, chronic kidney disease, and hypertension  are also affecting patient's functional outcome.   REHAB POTENTIAL: Good  CLINICAL DECISION MAKING: Evolving/moderate complexity  EVALUATION COMPLEXITY: Moderate  GOALS: Goals reviewed with patient? Yes  SHORT TERM GOALS: Target date: 03/16/23 Patient will be independent with her initial HEP. Baseline: Goal status: MET  2.  Patient will be able to demonstrate at least 90 degrees of left knee flexion for improved knee mobility. Baseline: 5/31: 96 degrees Goal status: MET  3.  Patient will be able to demonstrate active left knee extension within 10 degrees of neutral for improved gait mechanics. Baseline:  Goal status: MET  4.  Patient will be able to complete her daily activities without her familiar pain exceeding 8/10. Baseline:  Goal status: MET  5.  Patient will improve her LEFS score to 12/80.  Baseline: 6/12:  35/80 Goal status: MET  LONG TERM GOALS: Target date: 04/06/23  Patient will be independent with her advanced HEP.  Baseline:  Goal status: NOT MET  2.  Patient will be able to demonstrate at least 115 degrees of left knee flexion for improved function navigating stairs. Baseline: 6/12: 106 degrees Goal status: NOT MET  3.  Patient will be able to demonstrate active left knee extension within 5 degrees of neutral for improved gait mechanics. Baseline:  Goal status: MET  4.  Patient will be able to safely navigate at least 80 feet without an assistive device for improved household mobility. Baseline:  Goal status: MET  5.  Patient will be able to navigate at least 4 steps with a reciprocal pattern for improved household mobility. Baseline:  Goal status: MET  6.  Patient will improve her LEFS score to at least 23/80 Baseline: 6/12: 35/80 Goal status: MET  PLAN:  PT FREQUENCY: 2x/week  PT DURATION: other: 7 weeks  PLANNED INTERVENTIONS: Therapeutic exercises, Therapeutic activity, Neuromuscular re-education, Balance training, Gait training, Patient/Family education, Self Care, Joint mobilization, Stair training, Cryotherapy, Moist heat, Manual therapy, and Re-evaluation  PLAN FOR NEXT SESSION: DC  Marvell Fuller, PTA 04/04/2023, 8:52 AM

## 2023-06-14 ENCOUNTER — Ambulatory Visit: Payer: Self-pay | Admitting: Emergency Medicine

## 2023-06-14 DIAGNOSIS — G8929 Other chronic pain: Secondary | ICD-10-CM

## 2023-06-14 NOTE — H&P (View-Only) (Signed)
TOTAL KNEE ADMISSION H&P  Patient is being admitted for right total knee arthroplasty.  Subjective:  Chief Complaint:right knee pain.  HPI: Patricia Carrillo, 57 y.o. female, has a history of pain and functional disability in the right knee due to arthritis and has failed non-surgical conservative treatments for greater than 12 weeks to includeNSAID's and/or analgesics, corticosteriod injections, viscosupplementation injections, supervised PT with diminished ADL's post treatment, use of assistive devices, and activity modification.  Onset of symptoms was gradual, starting >10 years ago with gradually worsening course since that time. The patient noted prior procedures on the knee to include  arthroscopy on the right knee(s).  Patient currently rates pain in the right knee(s) at 8 out of 10 with activity. Patient has night pain, worsening of pain with activity and weight bearing, pain that interferes with activities of daily living, and pain with passive range of motion.  Patient has evidence of periarticular osteophytes and joint space narrowing by imaging studies.  There is no active infection.  Patient Active Problem List   Diagnosis Date Noted   Chronic right shoulder pain 02/08/2023   Lumbar degenerative disc disease 11/30/2021   Status post total shoulder arthroplasty, left 03/14/2021   Primary osteoarthritis of both knees 03/14/2021   Past Medical History:  Diagnosis Date   Anxiety    Arthritis    Chronic kidney disease    "stage 3" per pt   Fatigue    Headache    History of kidney stones    Hypertension    Obesity 01/16/2022   takes metformin and ozempic "for weight loss, not diabetic"   Pre-diabetes    Sleep apnea    dx 15 years ago, never used CPAP    Past Surgical History:  Procedure Laterality Date   CHOLECYSTECTOMY     LAPAROSCOPIC HYSTERECTOMY     muiltiple knee arthroscopies bilateral      REVERSE SHOULDER ARTHROPLASTY Left 08/17/2022   Procedure: REVERSE SHOULDER  ARTHROPLASTY;  Surgeon: Bjorn Pippin, MD;  Location: [facility];  Service: Orthopedics;  Laterality: Left;   SHOULDER ARTHROSCOPY WITH DISTAL CLAVICLE RESECTION Left 01/26/2022   Procedure: SHOULDER ARTHROSCOPY WITH DISTAL CLAVICLE EXCISION/ DEBRIDEMENT;  Surgeon: Bjorn Pippin, MD;  Location: [facility];  Service: Orthopedics;  Laterality: Left;   SHOULDER ARTHROSCOPY WITH SUBACROMIAL DECOMPRESSION, ROTATOR CUFF REPAIR AND BICEP TENDON REPAIR Left 01/26/2022   Procedure: SHOULDER ARTHROSCOPY WITH SUBACROMIAL DECOMPRESSION, ROTATOR CUFF REPAIR;  Surgeon: Bjorn Pippin, MD;  Location: [facility];  Service: Orthopedics;  Laterality: Left;   TONSILLECTOMY     TOTAL KNEE ARTHROPLASTY Left 02/14/2023   Procedure: TOTAL KNEE ARTHROPLASTY;  Surgeon: Joen Laura, MD;  Location: [facility];  Service: Orthopedics;  Laterality: Left;   TUBAL LIGATION      Current Outpatient Medications  Medication Sig Dispense Refill Last Dose   acetaminophen (TYLENOL) 500 MG tablet Take 1,000 mg by mouth every 6 (six) hours as needed for moderate pain.      cyclobenzaprine (FLEXERIL) 10 MG tablet Take 5-10 mg by mouth 2 (two) times daily as needed for muscle spasms. Alternates with Zanaflex      gabapentin (NEURONTIN) 800 MG tablet 1 tablet in the morning, 2 tablets in the evening      lisinopril-hydrochlorothiazide (ZESTORETIC) 20-12.5 MG tablet Take 1 tablet by mouth daily.      lovastatin (MEVACOR) 20 MG tablet Take 20 mg by mouth daily.      lubiprostone (AMITIZA) 8 MCG  capsule Take 8 mcg by mouth 2 (two) times daily.      metFORMIN (GLUCOPHAGE) 500 MG tablet Take 500 mg by mouth 2 (two) times daily with a meal.      Misc. Devices (QUAD CANE) MISC Four post quad cane 1 each 0    Misc. Devices (ROLLING WALKER/BURGUNDY) MISC Rolling walker for daily use 1 each 0    omeprazole (PRILOSEC) 40 MG capsule Take 40 mg by mouth daily.      oxyCODONE (ROXICODONE) 15 MG immediate  release tablet Take 15 mg by mouth 4 (four) times daily.      Semaglutide, 2 MG/DOSE, (OZEMPIC, 2 MG/DOSE,) 8 MG/3ML SOPN Inject 2 mg into the skin every Saturday.      tiZANidine (ZANAFLEX) 4 MG tablet Take 4 mg by mouth every 6 (six) hours as needed for muscle spasms.      No current facility-administered medications for this visit.   No Known Allergies  Social History   Tobacco Use   Smoking status: Former    Types: Cigarettes   Smokeless tobacco: Never  Substance Use Topics   Alcohol use: Never    No family history on file.   Review of Systems  Musculoskeletal:  Positive for arthralgias.  All other systems reviewed and are negative.   Objective:  Physical Exam Constitutional:      General: She is not in acute distress.    Appearance: Normal appearance. She is not ill-appearing.  HENT:     Head: Normocephalic and atraumatic.     Right Ear: External ear normal.     Left Ear: External ear normal.     Nose: Nose normal.     Mouth/Throat:     Mouth: Mucous membranes are moist.     Pharynx: Oropharynx is clear.  Eyes:     Extraocular Movements: Extraocular movements intact.     Conjunctiva/sclera: Conjunctivae normal.  Cardiovascular:     Rate and Rhythm: Normal rate and regular rhythm.     Pulses: Normal pulses.     Heart sounds: Normal heart sounds.  Pulmonary:     Effort: Pulmonary effort is normal.     Breath sounds: Normal breath sounds.  Abdominal:     General: Bowel sounds are normal.     Palpations: Abdomen is soft.     Tenderness: There is no abdominal tenderness.  Musculoskeletal:        General: Tenderness present.     Cervical back: Normal range of motion and neck supple.     Comments: TTP over medial and lateral joint line.  No calf tenderness, swelling, or erythema.  No overlying lesions of area of chief complaint.  Decreased strength and ROM due to elicited pain.  Pre-operative ROM 0-95.  Dorsiflexion and plantarflexion intact.  Stable to varus and  valgus stress.  BLE appear grossly neurovascularly intact.  Gait mildly antalgic.   Skin:    General: Skin is warm and dry.  Neurological:     Mental Status: She is alert and oriented to person, place, and time. Mental status is at baseline.  Psychiatric:        Mood and Affect: Mood normal.        Behavior: Behavior normal.     Vital signs in last 24 hours: @VSRANGES @  Labs:   Estimated body mass index is 36.28 kg/m as calculated from the following:   Height as of 02/14/23: 5\' 1"  (1.549 m).   Weight as of 02/14/23: 87.1 kg.  Imaging Review Plain radiographs demonstrate severe degenerative joint disease of the right knee(s).  The bone quality appears to be good for age and reported activity level.      Assessment/Plan:  End stage arthritis, right knee   The patient history, physical examination, clinical judgment of the provider and imaging studies are consistent with end stage degenerative joint disease of the right knee(s) and total knee arthroplasty is deemed medically necessary. The treatment options including medical management, injection therapy arthroscopy and arthroplasty were discussed at length. The risks and benefits of total knee arthroplasty were presented and reviewed. The risks due to aseptic loosening, infection, stiffness, patella tracking problems, thromboembolic complications and other imponderables were discussed. The patient acknowledged the explanation, agreed to proceed with the plan and consent was signed. Patient is being admitted for inpatient treatment for surgery, pain control, PT, OT, prophylactic antibiotics, VTE prophylaxis, progressive ambulation and ADL's and discharge planning. The patient is planning to be discharged outpatient PT     Patient's anticipated LOS is less than 2 midnights, meeting these requirements: - Younger than 60 - Lives within 1 hour of care - Has a competent adult at home to recover with post-op recover - NO history  of  - Chronic pain requiring opiods  - Diabetes  - Coronary Artery Disease  - Heart failure  - Heart attack  - Stroke  - DVT/VTE  - Cardiac arrhythmia  - Respiratory Failure/COPD  - Renal failure  - Anemia  - Advanced Liver disease

## 2023-06-14 NOTE — H&P (Signed)
TOTAL KNEE ADMISSION H&P  Patient is being admitted for right total knee arthroplasty.  Subjective:  Chief Complaint:right knee pain.  HPI: Patricia Carrillo, 57 y.o. female, has a history of pain and functional disability in the right knee due to arthritis and has failed non-surgical conservative treatments for greater than 12 weeks to includeNSAID's and/or analgesics, corticosteriod injections, viscosupplementation injections, supervised PT with diminished ADL's post treatment, use of assistive devices, and activity modification.  Onset of symptoms was gradual, starting >10 years ago with gradually worsening course since that time. The patient noted prior procedures on the knee to include  arthroscopy on the right knee(s).  Patient currently rates pain in the right knee(s) at 8 out of 10 with activity. Patient has night pain, worsening of pain with activity and weight bearing, pain that interferes with activities of daily living, and pain with passive range of motion.  Patient has evidence of periarticular osteophytes and joint space narrowing by imaging studies.  There is no active infection.  Patient Active Problem List   Diagnosis Date Noted   Chronic right shoulder pain 02/08/2023   Lumbar degenerative disc disease 11/30/2021   Status post total shoulder arthroplasty, left 03/14/2021   Primary osteoarthritis of both knees 03/14/2021   Past Medical History:  Diagnosis Date   Anxiety    Arthritis    Chronic kidney disease    "stage 3" per pt   Fatigue    Headache    History of kidney stones    Hypertension    Obesity 01/16/2022   takes metformin and ozempic "for weight loss, not diabetic"   Pre-diabetes    Sleep apnea    dx 15 years ago, never used CPAP    Past Surgical History:  Procedure Laterality Date   CHOLECYSTECTOMY     LAPAROSCOPIC HYSTERECTOMY     muiltiple knee arthroscopies bilateral      REVERSE SHOULDER ARTHROPLASTY Left 08/17/2022   Procedure: REVERSE SHOULDER  ARTHROPLASTY;  Surgeon: Bjorn Pippin, MD;  Location: [facility];  Service: Orthopedics;  Laterality: Left;   SHOULDER ARTHROSCOPY WITH DISTAL CLAVICLE RESECTION Left 01/26/2022   Procedure: SHOULDER ARTHROSCOPY WITH DISTAL CLAVICLE EXCISION/ DEBRIDEMENT;  Surgeon: Bjorn Pippin, MD;  Location: [facility];  Service: Orthopedics;  Laterality: Left;   SHOULDER ARTHROSCOPY WITH SUBACROMIAL DECOMPRESSION, ROTATOR CUFF REPAIR AND BICEP TENDON REPAIR Left 01/26/2022   Procedure: SHOULDER ARTHROSCOPY WITH SUBACROMIAL DECOMPRESSION, ROTATOR CUFF REPAIR;  Surgeon: Bjorn Pippin, MD;  Location: [facility];  Service: Orthopedics;  Laterality: Left;   TONSILLECTOMY     TOTAL KNEE ARTHROPLASTY Left 02/14/2023   Procedure: TOTAL KNEE ARTHROPLASTY;  Surgeon: Joen Laura, MD;  Location: [facility];  Service: Orthopedics;  Laterality: Left;   TUBAL LIGATION      Current Outpatient Medications  Medication Sig Dispense Refill Last Dose   acetaminophen (TYLENOL) 500 MG tablet Take 1,000 mg by mouth every 6 (six) hours as needed for moderate pain.      cyclobenzaprine (FLEXERIL) 10 MG tablet Take 5-10 mg by mouth 2 (two) times daily as needed for muscle spasms. Alternates with Zanaflex      gabapentin (NEURONTIN) 800 MG tablet 1 tablet in the morning, 2 tablets in the evening      lisinopril-hydrochlorothiazide (ZESTORETIC) 20-12.5 MG tablet Take 1 tablet by mouth daily.      lovastatin (MEVACOR) 20 MG tablet Take 20 mg by mouth daily.      lubiprostone (AMITIZA) 8 MCG  capsule Take 8 mcg by mouth 2 (two) times daily.      metFORMIN (GLUCOPHAGE) 500 MG tablet Take 500 mg by mouth 2 (two) times daily with a meal.      Misc. Devices (QUAD CANE) MISC Four post quad cane 1 each 0    Misc. Devices (ROLLING WALKER/BURGUNDY) MISC Rolling walker for daily use 1 each 0    omeprazole (PRILOSEC) 40 MG capsule Take 40 mg by mouth daily.      oxyCODONE (ROXICODONE) 15 MG immediate  release tablet Take 15 mg by mouth 4 (four) times daily.      Semaglutide, 2 MG/DOSE, (OZEMPIC, 2 MG/DOSE,) 8 MG/3ML SOPN Inject 2 mg into the skin every Saturday.      tiZANidine (ZANAFLEX) 4 MG tablet Take 4 mg by mouth every 6 (six) hours as needed for muscle spasms.      No current facility-administered medications for this visit.   No Known Allergies  Social History   Tobacco Use   Smoking status: Former    Types: Cigarettes   Smokeless tobacco: Never  Substance Use Topics   Alcohol use: Never    No family history on file.   Review of Systems  Musculoskeletal:  Positive for arthralgias.  All other systems reviewed and are negative.   Objective:  Physical Exam Constitutional:      General: She is not in acute distress.    Appearance: Normal appearance. She is not ill-appearing.  HENT:     Head: Normocephalic and atraumatic.     Right Ear: External ear normal.     Left Ear: External ear normal.     Nose: Nose normal.     Mouth/Throat:     Mouth: Mucous membranes are moist.     Pharynx: Oropharynx is clear.  Eyes:     Extraocular Movements: Extraocular movements intact.     Conjunctiva/sclera: Conjunctivae normal.  Cardiovascular:     Rate and Rhythm: Normal rate and regular rhythm.     Pulses: Normal pulses.     Heart sounds: Normal heart sounds.  Pulmonary:     Effort: Pulmonary effort is normal.     Breath sounds: Normal breath sounds.  Abdominal:     General: Bowel sounds are normal.     Palpations: Abdomen is soft.     Tenderness: There is no abdominal tenderness.  Musculoskeletal:        General: Tenderness present.     Cervical back: Normal range of motion and neck supple.     Comments: TTP over medial and lateral joint line.  No calf tenderness, swelling, or erythema.  No overlying lesions of area of chief complaint.  Decreased strength and ROM due to elicited pain.  Pre-operative ROM 0-95.  Dorsiflexion and plantarflexion intact.  Stable to varus and  valgus stress.  BLE appear grossly neurovascularly intact.  Gait mildly antalgic.   Skin:    General: Skin is warm and dry.  Neurological:     Mental Status: She is alert and oriented to person, place, and time. Mental status is at baseline.  Psychiatric:        Mood and Affect: Mood normal.        Behavior: Behavior normal.     Vital signs in last 24 hours: @VSRANGES @  Labs:   Estimated body mass index is 36.28 kg/m as calculated from the following:   Height as of 02/14/23: 5\' 1"  (1.549 m).   Weight as of 02/14/23: 87.1 kg.  Imaging Review Plain radiographs demonstrate severe degenerative joint disease of the right knee(s).  The bone quality appears to be good for age and reported activity level.      Assessment/Plan:  End stage arthritis, right knee   The patient history, physical examination, clinical judgment of the provider and imaging studies are consistent with end stage degenerative joint disease of the right knee(s) and total knee arthroplasty is deemed medically necessary. The treatment options including medical management, injection therapy arthroscopy and arthroplasty were discussed at length. The risks and benefits of total knee arthroplasty were presented and reviewed. The risks due to aseptic loosening, infection, stiffness, patella tracking problems, thromboembolic complications and other imponderables were discussed. The patient acknowledged the explanation, agreed to proceed with the plan and consent was signed. Patient is being admitted for inpatient treatment for surgery, pain control, PT, OT, prophylactic antibiotics, VTE prophylaxis, progressive ambulation and ADL's and discharge planning. The patient is planning to be discharged outpatient PT     Patient's anticipated LOS is less than 2 midnights, meeting these requirements: - Younger than 72 - Lives within 1 hour of care - Has a competent adult at home to recover with post-op recover - NO history  of  - Chronic pain requiring opiods  - Diabetes  - Coronary Artery Disease  - Heart failure  - Heart attack  - Stroke  - DVT/VTE  - Cardiac arrhythmia  - Respiratory Failure/COPD  - Renal failure  - Anemia  - Advanced Liver disease

## 2023-06-22 NOTE — Patient Instructions (Signed)
DUE TO COVID-19 ONLY TWO VISITORS  (aged 57 and older)  ARE ALLOWED TO COME WITH YOU AND STAY IN THE WAITING ROOM ONLY DURING PRE OP AND PROCEDURE.   **NO VISITORS ARE ALLOWED IN THE SHORT STAY AREA OR RECOVERY ROOM!!**  IF YOU WILL BE ADMITTED INTO THE HOSPITAL YOU ARE ALLOWED ONLY FOUR SUPPORT PEOPLE DURING VISITATION HOURS ONLY (7 AM -8PM)   The support person(s) must pass our screening, gel in and out, and wear a mask at all times, including in the patient's room. Patients must also wear a mask when staff or their support person are in the room. Visitors GUEST BADGE MUST BE WORN VISIBLY  One adult visitor may remain with you overnight and MUST be in the room by 8 P.M.     Your procedure is scheduled on: 07/02/23   Report to [facility] Main Entrance    Report to admitting at : 5:15 AM   Call this number if you have problems the morning of surgery [phone]   Do not eat food :After Midnight.   After Midnight you may have the following liquids until : 4:30 AM DAY OF SURGERY  Water Black Coffee (sugar ok, NO MILK/CREAM OR CREAMERS)  Tea (sugar ok, NO MILK/CREAM OR CREAMERS) regular and decaf                             Plain Jell-O (NO RED)                                           Fruit ices (not with fruit pulp, NO RED)                                     Popsicles (NO RED)                                                                  Juice: apple, WHITE grape, WHITE cranberry Sports drinks like Gatorade (NO RED)   The day of surgery:  Drink ONE (1) Pre-Surgery Clear G2 at : 4:30 AM the morning of surgery. Drink in one sitting. Do not sip.  This drink was given to you during your hospital  pre-op appointment visit. Nothing else to drink after completing the  Pre-Surgery Clear Ensure or G2.          If you have questions, please contact your surgeon's office.  FOLLOW ANY ADDITIONAL PRE OP INSTRUCTIONS YOU RECEIVED FROM YOUR SURGEON'S OFFICE!!!  Oral Hygiene  is also important to reduce your risk of infection.                                    Remember - BRUSH YOUR TEETH THE MORNING OF SURGERY WITH YOUR REGULAR TOOTHPASTE  DENTURES WILL BE REMOVED PRIOR TO SURGERY PLEASE DO NOT APPLY "Poly grip" OR ADHESIVES!!!   Do NOT smoke after Midnight   Take these medicines the morning of surgery with  A SIP OF WATER: omeprazole.Tylenol,gabapentin as needed.  How to Manage Your Diabetes Before and After Surgery  Why is it important to control my blood sugar before and after surgery? Improving blood sugar levels before and after surgery helps healing and can limit problems. A way of improving blood sugar control is eating a healthy diet by:  Eating less sugar and carbohydrates  Increasing activity/exercise  Talking with your doctor about reaching your blood sugar goals High blood sugars (greater than 180 mg/dL) can raise your risk of infections and slow your recovery, so you will need to focus on controlling your diabetes during the weeks before surgery. Make sure that the doctor who takes care of your diabetes knows about your planned surgery including the date and location.  How do I manage my blood sugar before surgery? Check your blood sugar at least 4 times a day, starting 2 days before surgery, to make sure that the level is not too high or low. Check your blood sugar the morning of your surgery when you wake up and every 2 hours until you get to the Short Stay unit. If your blood sugar is less than 70 mg/dL, you will need to treat for low blood sugar: Do not take insulin. Treat a low blood sugar (less than 70 mg/dL) with  cup of clear juice (cranberry or apple), 4 glucose tablets, OR glucose gel. Recheck blood sugar in 15 minutes after treatment (to make sure it is greater than 70 mg/dL). If your blood sugar is not greater than 70 mg/dL on recheck, call [phone] for further instructions. Report your blood sugar to the short stay nurse when you  get to Short Stay.  If you are admitted to the hospital after surgery: Your blood sugar will be checked by the staff and you will probably be given insulin after surgery (instead of oral diabetes medicines) to make sure you have good blood sugar levels. The goal for blood sugar control after surgery is 80-180 mg/dL.  WHAT DO I DO ABOUT MY DIABETES MEDICATION?     THE MORNING OF SURGERY, DO NOT TAKE ANY ORAL DIABETIC MEDICATIONS DAY OF YOUR SURGERY  DO NOT TAKE THE FOLLOWING 7 DAYS PRIOR TO SURGERY: Ozempic, Wegovy, Rybelsus (Semaglutide), Byetta (exenatide), Bydureon (exenatide ER), Victoza, Saxenda (liraglutide), or Trulicity (dulaglutide) Mounjaro (Tirzepatide) Adlyxin (Lixisenatide), Polyethylene Glycol Loxenatide. DO NOT take semaglutide after: 06/24/23                      You may not have any metal on your body including hair pins, jewelry, and body piercing             Do not wear make-up, lotions, powders, perfumes/cologne, or deodorant  Do not wear nail polish including gel and S&S, artificial/acrylic nails, or any other type of covering on natural nails including finger and toenails. If you have artificial nails, gel coating, etc. that needs to be removed by a nail salon please have this removed prior to surgery or surgery may need to be canceled/ delayed if the surgeon/ anesthesia feels like they are unable to be safely monitored.   Do not shave  48 hours prior to surgery.    Do not bring valuables to the hospital. [facility] IS NOT             RESPONSIBLE   FOR VALUABLES.   Contacts, glasses, or bridgework may not be worn into surgery.   Bring small overnight bag day of surgery.  DO NOT BRING YOUR HOME MEDICATIONS TO THE HOSPITAL. PHARMACY WILL DISPENSE MEDICATIONS LISTED ON YOUR MEDICATION LIST TO YOU DURING YOUR ADMISSION IN THE HOSPITAL!    Patients discharged on the day of surgery will not be allowed to drive home.  Someone NEEDS to stay with you for the first 24 hours  after anesthesia.   Special Instructions: Bring a copy of your healthcare power of attorney and living will documents         the day of surgery if you haven't scanned them before.              Please read over the following fact sheets you were given: IF YOU HAVE QUESTIONS ABOUT YOUR PRE-OP INSTRUCTIONS PLEASE CALL [phone]      Pre-operative 5 CHG Bath Instructions   You can play a key role in reducing the risk of infection after surgery. Your skin needs to be as free of germs as possible. You can reduce the number of germs on your skin by washing with CHG (chlorhexidine gluconate) soap before surgery. CHG is an antiseptic soap that kills germs and continues to kill germs even after washing.   DO NOT use if you have an allergy to chlorhexidine/CHG or antibacterial soaps. If your skin becomes reddened or irritated, stop using the CHG and notify one of our RNs at : [phone].   Please shower with the CHG soap starting 4 days before surgery using the following schedule:     Please keep in mind the following:  DO NOT shave, including legs and underarms, starting the day of your first shower.   You may shave your face at any point before/day of surgery.  Place clean sheets on your bed the day you start using CHG soap. Use a clean washcloth (not used since being washed) for each shower. DO NOT sleep with pets once you start using the CHG.   CHG Shower Instructions:  If you choose to wash your hair and private area, wash first with your normal shampoo/soap.  After you use shampoo/soap, rinse your hair and body thoroughly to remove shampoo/soap residue.  Turn the water OFF and apply about 3 tablespoons (45 ml) of CHG soap to a CLEAN washcloth.  Apply CHG soap ONLY FROM YOUR NECK DOWN TO YOUR TOES (washing for 3-5 minutes)  DO NOT use CHG soap on face, private areas, open wounds, or sores.  Pay special attention to the area where your surgery is being performed.  If you are having  back surgery, having someone wash your back for you may be helpful. Wait 2 minutes after CHG soap is applied, then you may rinse off the CHG soap.  Pat dry with a clean towel  Put on clean clothes/pajamas   If you choose to wear lotion, please use ONLY the CHG-compatible lotions on the back of this paper.     Additional instructions for the day of surgery: DO NOT APPLY any lotions, deodorants, cologne, or perfumes.   Put on clean/comfortable clothes.  Brush your teeth.  Ask your nurse before applying any prescription medications to the skin.    CHG Compatible Lotions   Aveeno Moisturizing lotion  Cetaphil Moisturizing Cream  Cetaphil Moisturizing Lotion  Clairol Herbal Essence Moisturizing Lotion, Dry Skin  Clairol Herbal Essence Moisturizing Lotion, Extra Dry Skin  Clairol Herbal Essence Moisturizing Lotion, Normal Skin  Curel Age Defying Therapeutic Moisturizing Lotion with Alpha Hydroxy  Curel Extreme Care Body Lotion  Curel Soothing Hands Moisturizing  Hand Lotion  Curel Therapeutic Moisturizing Cream, Fragrance-Free  Curel Therapeutic Moisturizing Lotion, Fragrance-Free  Curel Therapeutic Moisturizing Lotion, Original Formula  Eucerin Daily Replenishing Lotion  Eucerin Dry Skin Therapy Plus Alpha Hydroxy Crme  Eucerin Dry Skin Therapy Plus Alpha Hydroxy Lotion  Eucerin Original Crme  Eucerin Original Lotion  Eucerin Plus Crme Eucerin Plus Lotion  Eucerin TriLipid Replenishing Lotion  Keri Anti-Bacterial Hand Lotion  Keri Deep Conditioning Original Lotion Dry Skin Formula Softly Scented  Keri Deep Conditioning Original Lotion, Fragrance Free Sensitive Skin Formula  Keri Lotion Fast Absorbing Fragrance Free Sensitive Skin Formula  Keri Lotion Fast Absorbing Softly Scented Dry Skin Formula  Keri Original Lotion  Keri Skin Renewal Lotion Keri Silky Smooth Lotion  Keri Silky Smooth Sensitive Skin Lotion  Nivea Body Creamy Conditioning Oil  Nivea Body Extra Enriched  Lotion  Nivea Body Original Lotion  Nivea Body Sheer Moisturizing Lotion Nivea Crme  Nivea Skin Firming Lotion  NutraDerm 30 Skin Lotion  NutraDerm Skin Lotion  NutraDerm Therapeutic Skin Cream  NutraDerm Therapeutic Skin Lotion  ProShield Protective Hand Cream  Provon moisturizing lotion   Incentive Spirometer  An incentive spirometer is a tool that can help keep your lungs clear and active. This tool measures how well you are filling your lungs with each breath. Taking long deep breaths may help reverse or decrease the chance of developing breathing (pulmonary) problems (especially infection) following: A long period of time when you are unable to move or be active. BEFORE THE PROCEDURE  If the spirometer includes an indicator to show your best effort, your nurse or respiratory therapist will set it to a desired goal. If possible, sit up straight or lean slightly forward. Try not to slouch. Hold the incentive spirometer in an upright position. INSTRUCTIONS FOR USE  Sit on the edge of your bed if possible, or sit up as far as you can in bed or on a chair. Hold the incentive spirometer in an upright position. Breathe out normally. Place the mouthpiece in your mouth and seal your lips tightly around it. Breathe in slowly and as deeply as possible, raising the piston or the ball toward the top of the column. Hold your breath for 3-5 seconds or for as long as possible. Allow the piston or ball to fall to the bottom of the column. Remove the mouthpiece from your mouth and breathe out normally. Rest for a few seconds and repeat Steps 1 through 7 at least 10 times every 1-2 hours when you are awake. Take your time and take a few normal breaths between deep breaths. The spirometer may include an indicator to show your best effort. Use the indicator as a goal to work toward during each repetition. After each set of 10 deep breaths, practice coughing to be sure your lungs are clear. If you have  an incision (the cut made at the time of surgery), support your incision when coughing by placing a pillow or rolled up towels firmly against it. Once you are able to get out of bed, walk around indoors and cough well. You may stop using the incentive spirometer when instructed by your caregiver.  RISKS AND COMPLICATIONS Take your time so you do not get dizzy or light-headed. If you are in pain, you may need to take or ask for pain medication before doing incentive spirometry. It is harder to take a deep breath if you are having pain. AFTER USE Rest and breathe slowly and easily. It can be helpful to  keep track of a log of your progress. Your caregiver can provide you with a simple table to help with this. If you are using the spirometer at home, follow these instructions: SEEK MEDICAL CARE IF:  You are having difficultly using the spirometer. You have trouble using the spirometer as often as instructed. Your pain medication is not giving enough relief while using the spirometer. You develop fever of 100.5 F (38.1 C) or higher. SEEK IMMEDIATE MEDICAL CARE IF:  You cough up bloody sputum that had not been present before. You develop fever of 102 F (38.9 C) or greater. You develop worsening pain at or near the incision site. MAKE SURE YOU:  Understand these instructions. Will watch your condition. Will get help right away if you are not doing well or get worse. Document Released: 02/05/2007 Document Revised: 12/18/2011 Document Reviewed: 04/08/2007 [facility] Patient Information 2014 [place], [place].   ________________________________________________________________________

## 2023-06-25 ENCOUNTER — Encounter (HOSPITAL_COMMUNITY): Payer: Self-pay

## 2023-06-25 ENCOUNTER — Encounter (HOSPITAL_COMMUNITY)
Admission: RE | Admit: 2023-06-25 | Discharge: 2023-06-25 | Disposition: A | Discharge status: Home / Self Care | Payer: Medicaid Other | Source: Ambulatory Visit | Attending: Orthopedic Surgery | Admitting: Orthopedic Surgery

## 2023-06-25 ENCOUNTER — Other Ambulatory Visit: Payer: Self-pay

## 2023-06-25 VITALS — BP 129/75 | HR 74 | Temp 98.6°F | Ht 61.0 in | Wt 184.0 lb

## 2023-06-25 DIAGNOSIS — Z01818 Encounter for other preprocedural examination: Secondary | ICD-10-CM

## 2023-06-25 DIAGNOSIS — Z01812 Encounter for preprocedural laboratory examination: Secondary | ICD-10-CM | POA: Insufficient documentation

## 2023-06-25 DIAGNOSIS — Z794 Long term (current) use of insulin: Secondary | ICD-10-CM | POA: Insufficient documentation

## 2023-06-25 DIAGNOSIS — E119 Type 2 diabetes mellitus without complications: Secondary | ICD-10-CM | POA: Diagnosis not present

## 2023-06-25 DIAGNOSIS — G8929 Other chronic pain: Secondary | ICD-10-CM | POA: Diagnosis not present

## 2023-06-25 DIAGNOSIS — M25561 Pain in right knee: Secondary | ICD-10-CM | POA: Diagnosis not present

## 2023-06-25 LAB — COMPREHENSIVE METABOLIC PANEL
ALT: 13 U/L (ref 0–44)
AST: 16 U/L (ref 15–41)
Albumin: 3.8 g/dL (ref 3.5–5.0)
Alkaline Phosphatase: 54 U/L (ref 38–126)
Anion gap: 9 (ref 5–15)
BUN: 23 mg/dL — ABNORMAL HIGH (ref 6–20)
CO2: 25 mmol/L (ref 22–32)
Calcium: 9.1 mg/dL (ref 8.9–10.3)
Chloride: 105 mmol/L (ref 98–111)
Creatinine, Ser: 0.88 mg/dL (ref 0.44–1.00)
GFR, Estimated: >60 mL/min (ref >60)
Glucose, Bld: 82 mg/dL (ref 70–99)
Potassium: 4.8 mmol/L (ref 3.5–5.1)
Sodium: 139 mmol/L (ref 135–145)
Total Bilirubin: 0.5 mg/dL (ref 0.3–1.2)
Total Protein: 7.1 g/dL (ref 6.5–8.1)

## 2023-06-25 LAB — CBC WITH DIFFERENTIAL/PLATELET
Abs Immature Granulocytes: 0.02 10*3/uL (ref 0.00–0.07)
Basophils Absolute: 0 10*3/uL (ref 0.0–0.1)
Basophils Relative: 1 %
Eosinophils Absolute: 0.4 10*3/uL (ref 0.0–0.5)
Eosinophils Relative: 6 %
HCT: 38.6 % (ref 36.0–46.0)
Hemoglobin: 11.8 g/dL — ABNORMAL LOW (ref 12.0–15.0)
Immature Granulocytes: 0 %
Lymphocytes Relative: 37 %
Lymphs Abs: 2.5 10*3/uL (ref 0.7–4.0)
MCH: 27.7 pg (ref 26.0–34.0)
MCHC: 30.6 g/dL (ref 30.0–36.0)
MCV: 90.6 fL (ref 80.0–100.0)
Monocytes Absolute: 0.4 10*3/uL (ref 0.1–1.0)
Monocytes Relative: 7 %
Neutro Abs: 3.3 10*3/uL (ref 1.7–7.7)
Neutrophils Relative %: 49 %
Platelets: 344 10*3/uL (ref 150–400)
RBC: 4.26 MIL/uL (ref 3.87–5.11)
RDW: 13 % (ref 11.5–15.5)
WBC: 6.6 10*3/uL (ref 4.0–10.5)
nRBC: 0 % (ref 0.0–0.2)

## 2023-06-25 LAB — GLUCOSE, CAPILLARY: Glucose-Capillary: 93 mg/dL (ref 70–99)

## 2023-06-25 LAB — SURGICAL PCR SCREEN
MRSA, PCR: NEGATIVE
Staphylococcus aureus: NEGATIVE

## 2023-06-25 NOTE — Progress Notes (Signed)
For Short Stay: COVID SWAB appointment date:  Bowel Prep reminder:   For Anesthesia: PCP -  April Manson, NP    Cardiologist - N/A  Chest x-ray -  EKG -  Stress Test -  ECHO -  Cardiac Cath -  Pacemaker/ICD device last checked: Pacemaker orders received: Device Rep notified:  Spinal Cord Stimulator: N/A  Sleep Study - Yes CPAP - NO  Fasting Blood Sugar - N/A Checks Blood Sugar ___0__ times a day Date and result of last Hgb A1c- 5.5.: 03/15/23  Last dose of GLP1 agonist- semaglutide last dose: 06/24/23 GLP1 instructions: To hold it.  Last dose of SGLT-2 inhibitors- N/A SGLT-2 instructions:   Blood Thinner Instructions: N/A Aspirin Instructions: Last Dose:  Activity level: Can go up a flight of stairs and activities of daily living without stopping and without chest pain and/or shortness of breath   Able to exercise without chest pain and/or shortness of breath Anesthesia review: Hx: HTN,DIA,CKD III B,OSA(NO CPAP)  Patient denies shortness of breath, fever, cough and chest pain at PAT appointment   Patient verbalized understanding of instructions that were given to them at the PAT appointment. Patient was also instructed that they will need to review over the PAT instructions again at home before surgery.

## 2023-06-26 LAB — HEMOGLOBIN A1C
Hgb A1c MFr Bld: 5.3 % (ref 4.8–5.6)
Mean Plasma Glucose: 105 mg/dL

## 2023-06-30 NOTE — Anesthesia Preprocedure Evaluation (Signed)
Anesthesia Evaluation  Patient identified by MRN, date of birth, ID band Patient awake    Reviewed: Allergy & Precautions, H&P , NPO status , Patient's Chart, lab work & pertinent test results  Airway Mallampati: II  TM Distance: >3 FB Neck ROM: Full    Dental no notable dental hx. (+) Teeth Intact, Dental Advisory Given   Pulmonary neg pulmonary ROS, former smoker   Pulmonary exam normal breath sounds clear to auscultation       Cardiovascular Exercise Tolerance: Good hypertension, Pt. on medications  Rhythm:Regular Rate:Normal     Neuro/Psych  Headaches  Anxiety        GI/Hepatic negative GI ROS, Neg liver ROS,,,  Endo/Other  diabetes, Type 2  Morbid obesity  Renal/GU negative Renal ROS  negative genitourinary   Musculoskeletal  (+) Arthritis , Osteoarthritis,    Abdominal   Peds  Hematology negative hematology ROS (+)   Anesthesia Other Findings   Reproductive/Obstetrics negative OB ROS                             Anesthesia Physical Anesthesia Plan  ASA: 3  Anesthesia Plan: Spinal   Post-op Pain Management: Regional block* and Tylenol PO (pre-op)*   Induction: Intravenous  PONV Risk Score and Plan: 3 and Ondansetron, Dexamethasone, Propofol infusion and Midazolam  Airway Management Planned: Natural Airway and Simple Face Mask  Additional Equipment:   Intra-op Plan:   Post-operative Plan:   Informed Consent: I have reviewed the patients History and Physical, chart, labs and discussed the procedure including the risks, benefits and alternatives for the proposed anesthesia with the patient or authorized representative who has indicated his/her understanding and acceptance.     Dental advisory given  Plan Discussed with: CRNA  Anesthesia Plan Comments:        Anesthesia Quick Evaluation

## 2023-07-02 ENCOUNTER — Encounter (HOSPITAL_COMMUNITY)
Admission: RE | Disposition: A | Discharge status: Home / Self Care | Payer: Self-pay | Source: Home / Self Care | Attending: Orthopedic Surgery

## 2023-07-02 ENCOUNTER — Ambulatory Visit (HOSPITAL_COMMUNITY)
Admission: RE | Admit: 2023-07-02 | Discharge: 2023-07-02 | Disposition: A | Discharge status: Home / Self Care | Payer: Medicaid Other | Attending: Orthopedic Surgery | Admitting: Orthopedic Surgery

## 2023-07-02 ENCOUNTER — Encounter (HOSPITAL_COMMUNITY): Payer: Self-pay | Admitting: Orthopedic Surgery

## 2023-07-02 ENCOUNTER — Ambulatory Visit (HOSPITAL_BASED_OUTPATIENT_CLINIC_OR_DEPARTMENT_OTHER): Payer: Medicaid Other | Admitting: Anesthesiology

## 2023-07-02 ENCOUNTER — Ambulatory Visit (HOSPITAL_COMMUNITY): Payer: Medicaid Other

## 2023-07-02 ENCOUNTER — Ambulatory Visit (HOSPITAL_COMMUNITY): Payer: Self-pay | Admitting: Anesthesiology

## 2023-07-02 DIAGNOSIS — Z87891 Personal history of nicotine dependence: Secondary | ICD-10-CM | POA: Diagnosis not present

## 2023-07-02 DIAGNOSIS — M1711 Unilateral primary osteoarthritis, right knee: Secondary | ICD-10-CM | POA: Insufficient documentation

## 2023-07-02 DIAGNOSIS — E119 Type 2 diabetes mellitus without complications: Secondary | ICD-10-CM

## 2023-07-02 DIAGNOSIS — Z6836 Body mass index (BMI) 36.0-36.9, adult: Secondary | ICD-10-CM | POA: Insufficient documentation

## 2023-07-02 DIAGNOSIS — I10 Essential (primary) hypertension: Secondary | ICD-10-CM | POA: Diagnosis not present

## 2023-07-02 DIAGNOSIS — Z7984 Long term (current) use of oral hypoglycemic drugs: Secondary | ICD-10-CM

## 2023-07-02 HISTORY — PX: TOTAL KNEE ARTHROPLASTY: SHX125

## 2023-07-02 LAB — GLUCOSE, CAPILLARY: Glucose-Capillary: 93 mg/dL (ref 70–99)

## 2023-07-02 LAB — TYPE AND SCREEN
ABO/RH(D): A POS
Antibody Screen: NEGATIVE

## 2023-07-02 LAB — ABO/RH: ABO/RH(D): A POS

## 2023-07-02 SURGERY — ARTHROPLASTY, KNEE, TOTAL
Anesthesia: Spinal | Site: Knee | Laterality: Right

## 2023-07-02 MED ORDER — ACETAMINOPHEN 500 MG PO TABS
1000.0000 mg | ORAL_TABLET | Freq: Four times a day (QID) | ORAL | Status: DC
  Administered 2023-07-02: 1000 mg via ORAL

## 2023-07-02 MED ORDER — KETOROLAC TROMETHAMINE 15 MG/ML IJ SOLN
7.5000 mg | Freq: Four times a day (QID) | INTRAMUSCULAR | Status: DC
  Administered 2023-07-02: 7.5 mg via INTRAVENOUS

## 2023-07-02 MED ORDER — CEFAZOLIN SODIUM-DEXTROSE 2-4 GM/100ML-% IV SOLN
2.0000 g | INTRAVENOUS | Status: AC
  Administered 2023-07-02: 2 g via INTRAVENOUS
  Filled 2023-07-02: qty 100

## 2023-07-02 MED ORDER — SODIUM CHLORIDE (PF) 0.9 % IJ SOLN
INTRAMUSCULAR | Status: AC
  Filled 2023-07-02: qty 50

## 2023-07-02 MED ORDER — CELECOXIB 100 MG PO CAPS: 100.0000 mg | ORAL_CAPSULE | Freq: Two times a day (BID) | ORAL | 0 refills | Status: AC

## 2023-07-02 MED ORDER — ONDANSETRON HCL 4 MG/2ML IJ SOLN
INTRAMUSCULAR | Status: AC
  Filled 2023-07-02: qty 2

## 2023-07-02 MED ORDER — PHENYLEPHRINE HCL-NACL 20-0.9 MG/250ML-% IV SOLN
INTRAVENOUS | Status: DC | PRN
Start: 2023-07-02 — End: 2023-07-02
  Administered 2023-07-02: 20 ug/min via INTRAVENOUS

## 2023-07-02 MED ORDER — ORAL CARE MOUTH RINSE: 15.0000 mL | Freq: Once | OROMUCOSAL | Status: AC

## 2023-07-02 MED ORDER — ASPIRIN 81 MG PO TBEC: 81.0000 mg | DELAYED_RELEASE_TABLET | Freq: Two times a day (BID) | ORAL | Status: AC

## 2023-07-02 MED ORDER — BUPIVACAINE-EPINEPHRINE 0.25% -1:200000 IJ SOLN
INTRAMUSCULAR | Status: DC | PRN
  Administered 2023-07-02: 30 mL

## 2023-07-02 MED ORDER — HYDROMORPHONE HCL 1 MG/ML IJ SOLN
0.2500 mg | INTRAMUSCULAR | Status: DC | PRN
  Administered 2023-07-02 (×2): 0.25 mg via INTRAVENOUS
  Administered 2023-07-02: 0.5 mg via INTRAVENOUS

## 2023-07-02 MED ORDER — MIDAZOLAM HCL 2 MG/2ML IJ SOLN
INTRAMUSCULAR | Status: AC
  Filled 2023-07-02: qty 2

## 2023-07-02 MED ORDER — ONDANSETRON HCL 4 MG/2ML IJ SOLN
INTRAMUSCULAR | Status: DC | PRN
  Administered 2023-07-02: 4 mg via INTRAVENOUS

## 2023-07-02 MED ORDER — POLYETHYLENE GLYCOL 3350 17 G PO PACK: 17.0000 g | PACK | Freq: Every day | ORAL | 0 refills | Status: AC

## 2023-07-02 MED ORDER — CEFAZOLIN SODIUM-DEXTROSE 2-4 GM/100ML-% IV SOLN
INTRAVENOUS | Status: AC
  Filled 2023-07-02: qty 100

## 2023-07-02 MED ORDER — ONDANSETRON HCL 4 MG PO TABS: 4.0000 mg | ORAL_TABLET | Freq: Three times a day (TID) | ORAL | 0 refills | Status: AC | PRN

## 2023-07-02 MED ORDER — ACETAMINOPHEN 325 MG PO TABS: 325.0000 mg | ORAL_TABLET | Freq: Four times a day (QID) | ORAL | Status: DC | PRN

## 2023-07-02 MED ORDER — ONDANSETRON HCL 4 MG PO TABS: 4.0000 mg | ORAL_TABLET | Freq: Four times a day (QID) | ORAL | Status: DC | PRN

## 2023-07-02 MED ORDER — HYDROMORPHONE HCL 1 MG/ML IJ SOLN
INTRAMUSCULAR | Status: AC
  Administered 2023-07-02: 0.5 mg via INTRAVENOUS
  Filled 2023-07-02: qty 1

## 2023-07-02 MED ORDER — ONDANSETRON HCL 4 MG/2ML IJ SOLN: 4.0000 mg | Freq: Four times a day (QID) | INTRAMUSCULAR | Status: DC | PRN

## 2023-07-02 MED ORDER — BUPIVACAINE-EPINEPHRINE 0.25% -1:200000 IJ SOLN
INTRAMUSCULAR | Status: AC
  Filled 2023-07-02: qty 1

## 2023-07-02 MED ORDER — SODIUM CHLORIDE 0.9% FLUSH
INTRAVENOUS | Status: DC | PRN
  Administered 2023-07-02: 30 mL

## 2023-07-02 MED ORDER — HYDROMORPHONE HCL 1 MG/ML IJ SOLN: 0.5000 mg | INTRAMUSCULAR | Status: DC | PRN

## 2023-07-02 MED ORDER — METHOCARBAMOL 500 MG IVPB - SIMPLE MED
500.0000 mg | Freq: Four times a day (QID) | INTRAVENOUS | Status: DC | PRN
  Administered 2023-07-02: 500 mg via INTRAVENOUS

## 2023-07-02 MED ORDER — METHOCARBAMOL 500 MG PO TABS: 500.0000 mg | ORAL_TABLET | Freq: Four times a day (QID) | ORAL | Status: DC | PRN

## 2023-07-02 MED ORDER — ISOPROPYL ALCOHOL 70 % SOLN
Status: DC | PRN
  Administered 2023-07-02: 1 via TOPICAL

## 2023-07-02 MED ORDER — LACTATED RINGERS IV BOLUS
250.0000 mL | Freq: Once | INTRAVENOUS | Status: AC
  Administered 2023-07-02: 250 mL via INTRAVENOUS

## 2023-07-02 MED ORDER — DEXAMETHASONE SODIUM PHOSPHATE 10 MG/ML IJ SOLN
INTRAMUSCULAR | Status: AC
  Filled 2023-07-02: qty 1

## 2023-07-02 MED ORDER — CYCLOBENZAPRINE HCL 10 MG PO TABS: 10.0000 mg | ORAL_TABLET | Freq: Two times a day (BID) | ORAL | 0 refills | Status: AC | PRN

## 2023-07-02 MED ORDER — CEFAZOLIN SODIUM-DEXTROSE 2-4 GM/100ML-% IV SOLN
2.0000 g | Freq: Four times a day (QID) | INTRAVENOUS | Status: DC
  Administered 2023-07-02: 2 g via INTRAVENOUS

## 2023-07-02 MED ORDER — SODIUM CHLORIDE 0.9 % IV SOLN: INTRAVENOUS | Status: DC

## 2023-07-02 MED ORDER — WATER FOR IRRIGATION, STERILE IR SOLN
Status: DC | PRN
  Administered 2023-07-02: 2000 mL

## 2023-07-02 MED ORDER — ACETAMINOPHEN 500 MG PO TABS
ORAL_TABLET | ORAL | Status: AC
  Filled 2023-07-02: qty 2

## 2023-07-02 MED ORDER — KETOROLAC TROMETHAMINE 15 MG/ML IJ SOLN
INTRAMUSCULAR | Status: AC
  Filled 2023-07-02: qty 1

## 2023-07-02 MED ORDER — BUPIVACAINE LIPOSOME 1.3 % IJ SUSP: 20.0000 mL | Freq: Once | INTRAMUSCULAR | Status: DC

## 2023-07-02 MED ORDER — LACTATED RINGERS IV BOLUS
500.0000 mL | Freq: Once | INTRAVENOUS | Status: AC
  Administered 2023-07-02: 500 mL via INTRAVENOUS

## 2023-07-02 MED ORDER — LACTATED RINGERS IV SOLN: INTRAVENOUS | Status: DC

## 2023-07-02 MED ORDER — BUPIVACAINE LIPOSOME 1.3 % IJ SUSP
INTRAMUSCULAR | Status: AC
  Filled 2023-07-02: qty 20

## 2023-07-02 MED ORDER — POVIDONE-IODINE 10 % EX SWAB: 2.0000 | Freq: Once | CUTANEOUS | Status: DC

## 2023-07-02 MED ORDER — OXYCODONE HCL 5 MG PO TABS
ORAL_TABLET | ORAL | Status: AC
  Filled 2023-07-02: qty 3

## 2023-07-02 MED ORDER — FENTANYL CITRATE (PF) 100 MCG/2ML IJ SOLN
INTRAMUSCULAR | Status: DC | PRN
  Administered 2023-07-02 (×2): 50 ug via INTRAVENOUS

## 2023-07-02 MED ORDER — PROPOFOL 1000 MG/100ML IV EMUL
INTRAVENOUS | Status: AC
  Filled 2023-07-02: qty 100

## 2023-07-02 MED ORDER — ACETAMINOPHEN 500 MG PO TABS
1000.0000 mg | ORAL_TABLET | Freq: Once | ORAL | Status: AC
  Administered 2023-07-02: 1000 mg via ORAL

## 2023-07-02 MED ORDER — ACETAMINOPHEN 500 MG PO TABS: 1000.0000 mg | ORAL_TABLET | Freq: Three times a day (TID) | ORAL | Status: AC | PRN

## 2023-07-02 MED ORDER — SODIUM CHLORIDE 0.9 % IR SOLN
Status: DC | PRN
  Administered 2023-07-02: 3000 mL

## 2023-07-02 MED ORDER — BUPIVACAINE IN DEXTROSE 0.75-8.25 % IT SOLN
INTRATHECAL | Status: DC | PRN
Start: 2023-07-02 — End: 2023-07-02
  Administered 2023-07-02: 2 mL via INTRATHECAL

## 2023-07-02 MED ORDER — FENTANYL CITRATE (PF) 100 MCG/2ML IJ SOLN
INTRAMUSCULAR | Status: AC
  Filled 2023-07-02: qty 2

## 2023-07-02 MED ORDER — MIDAZOLAM HCL 5 MG/5ML IJ SOLN
INTRAMUSCULAR | Status: DC | PRN
  Administered 2023-07-02 (×2): 1 mg via INTRAVENOUS

## 2023-07-02 MED ORDER — 0.9 % SODIUM CHLORIDE (POUR BTL) OPTIME
TOPICAL | Status: DC | PRN
  Administered 2023-07-02: 1000 mL

## 2023-07-02 MED ORDER — TRANEXAMIC ACID-NACL 1000-0.7 MG/100ML-% IV SOLN
1000.0000 mg | INTRAVENOUS | Status: AC
  Administered 2023-07-02: 1000 mg via INTRAVENOUS
  Filled 2023-07-02: qty 100

## 2023-07-02 MED ORDER — CHLORHEXIDINE GLUCONATE 0.12 % MT SOLN
15.0000 mL | Freq: Once | OROMUCOSAL | Status: AC
  Administered 2023-07-02: 15 mL via OROMUCOSAL

## 2023-07-02 MED ORDER — ACETAMINOPHEN 500 MG PO TABS
1000.0000 mg | ORAL_TABLET | Freq: Once | ORAL | Status: DC
  Filled 2023-07-02: qty 2

## 2023-07-02 MED ORDER — BUPIVACAINE-EPINEPHRINE (PF) 0.5% -1:200000 IJ SOLN
INTRAMUSCULAR | Status: DC | PRN
  Administered 2023-07-02: 20 mL via PERINEURAL

## 2023-07-02 MED ORDER — METHOCARBAMOL 500 MG IVPB - SIMPLE MED
INTRAVENOUS | Status: AC
  Filled 2023-07-02: qty 55

## 2023-07-02 MED ORDER — OXYCODONE HCL 5 MG PO TABS
10.0000 mg | ORAL_TABLET | ORAL | Status: DC | PRN
  Administered 2023-07-02: 15 mg via ORAL

## 2023-07-02 MED ORDER — DEXAMETHASONE SODIUM PHOSPHATE 10 MG/ML IJ SOLN
8.0000 mg | Freq: Once | INTRAMUSCULAR | Status: AC
  Administered 2023-07-02: 8 mg via INTRAVENOUS

## 2023-07-02 MED ORDER — PROPOFOL 500 MG/50ML IV EMUL
INTRAVENOUS | Status: DC | PRN
  Administered 2023-07-02: 30 ug/kg/min via INTRAVENOUS
  Administered 2023-07-02: 30 mg via INTRAVENOUS
  Administered 2023-07-02: 70 ug/kg/min via INTRAVENOUS

## 2023-07-02 SURGICAL SUPPLY — 66 items
ADH SKN CLS APL DERMABOND .7 (GAUZE/BANDAGES/DRESSINGS) ×3
APL PRP STRL LF DISP 70% ISPRP (MISCELLANEOUS) ×2
BAG COUNTER SPONGE SURGICOUNT (BAG) IMPLANT
BAG SPNG CNTER NS LX DISP (BAG)
BLADE SAG 18X100X1.27 (BLADE) ×1 IMPLANT
BLADE SAW SAG 35X64 .89 (BLADE) ×1 IMPLANT
BNDG CMPR 5X3 CHSV STRCH STRL (GAUZE/BANDAGES/DRESSINGS) ×1
BNDG CMPR MED 10X6 ELC LF (GAUZE/BANDAGES/DRESSINGS) ×1
BNDG CMPR MED 15X6 ELC VLCR LF (GAUZE/BANDAGES/DRESSINGS) ×1
BNDG COHESIVE 3X5 TAN ST LF (GAUZE/BANDAGES/DRESSINGS) ×1 IMPLANT
BNDG ELASTIC 6X10 VLCR STRL LF (GAUZE/BANDAGES/DRESSINGS) ×1 IMPLANT
BNDG ELASTIC 6X15 VLCR STRL LF (GAUZE/BANDAGES/DRESSINGS) IMPLANT
BOWL SMART MIX CTS (DISPOSABLE) ×1 IMPLANT
CEMENT BONE R 1X40 (Cement) IMPLANT
CEMENT BONE REFOBACIN R1X40 US (Cement) IMPLANT
CHLORAPREP W/TINT 26 (MISCELLANEOUS) ×2 IMPLANT
COMP FEM PS KNEE NRW 7 RT (Joint) ×1 IMPLANT
COMP PATELLA PEG 3 32 (Joint) ×1 IMPLANT
COMP TIB KNEE PS C 0D RT (Knees) ×1 IMPLANT
COMPONENT FEM PS KNEE NRW 7 RT (Joint) IMPLANT
COMPONENT PATELLA PEG 3 32 (Joint) IMPLANT
COMPONENT TIB KNEE PS C 0D RT (Knees) IMPLANT
COVER SURGICAL LIGHT HANDLE (MISCELLANEOUS) ×1 IMPLANT
CUFF TOURN SGL QUICK 34 (TOURNIQUET CUFF) ×1
CUFF TRNQT CYL 34X4.125X (TOURNIQUET CUFF) ×1 IMPLANT
DERMABOND ADVANCED .7 DNX12 (GAUZE/BANDAGES/DRESSINGS) ×1 IMPLANT
DRAPE INCISE IOBAN 85X60 (DRAPES) ×1 IMPLANT
DRAPE SHEET LG 3/4 BI-LAMINATE (DRAPES) ×1 IMPLANT
DRAPE U-SHAPE 47X51 STRL (DRAPES) ×1 IMPLANT
DRESSING AQUACEL AG SP 3.5X10 (GAUZE/BANDAGES/DRESSINGS) ×1 IMPLANT
DRSG AQUACEL AG ADV 3.5X10 (GAUZE/BANDAGES/DRESSINGS) IMPLANT
DRSG AQUACEL AG SP 3.5X10 (GAUZE/BANDAGES/DRESSINGS) ×1
ELECT REM PT RETURN 15FT ADLT (MISCELLANEOUS) ×1 IMPLANT
GAUZE SPONGE 4X4 12PLY STRL (GAUZE/BANDAGES/DRESSINGS) ×1 IMPLANT
GLOVE BIO SURGEON STRL SZ 6.5 (GLOVE) ×2 IMPLANT
GLOVE BIOGEL PI IND STRL 6.5 (GLOVE) ×1 IMPLANT
GLOVE BIOGEL PI IND STRL 8 (GLOVE) ×1 IMPLANT
GLOVE SURG ORTHO 8.0 STRL STRW (GLOVE) ×2 IMPLANT
GOWN STRL REUS W/ TWL XL LVL3 (GOWN DISPOSABLE) ×2 IMPLANT
GOWN STRL REUS W/TWL XL LVL3 (GOWN DISPOSABLE) ×2
HANDPIECE INTERPULSE COAX TIP (DISPOSABLE) ×1
HOLDER FOLEY CATH W/STRAP (MISCELLANEOUS) ×1 IMPLANT
HOOD PEEL AWAY T7 (MISCELLANEOUS) ×3 IMPLANT
KIT TURNOVER KIT A (KITS) IMPLANT
LINER ASF PERS 10X6/7 CD RT (Liner) IMPLANT
MANIFOLD NEPTUNE II (INSTRUMENTS) ×1 IMPLANT
MARKER SKIN DUAL TIP RULER LAB (MISCELLANEOUS) ×1 IMPLANT
NS IRRIG 1000ML POUR BTL (IV SOLUTION) ×1 IMPLANT
PACK TOTAL KNEE CUSTOM (KITS) ×1 IMPLANT
PIN DRILL HDLS TROCAR 75 4PK (PIN) IMPLANT
SCREW HEADED 33MM KNEE (MISCELLANEOUS) IMPLANT
SET HNDPC FAN SPRY TIP SCT (DISPOSABLE) ×1 IMPLANT
SOLUTION IRRIG SURGIPHOR (IV SOLUTION) IMPLANT
SPIKE FLUID TRANSFER (MISCELLANEOUS) ×1 IMPLANT
STRIP CLOSURE SKIN 1/2X4 (GAUZE/BANDAGES/DRESSINGS) ×1 IMPLANT
SUT MNCRL AB 3-0 PS2 18 (SUTURE) ×1 IMPLANT
SUT STRATAFIX PDO 1 14 VIOLET (SUTURE) ×1
SUT STRATFX PDO 1 14 VIOLET (SUTURE) ×1
SUT VIC AB 2-0 CT2 27 (SUTURE) ×2 IMPLANT
SUT VLOC 180 0 24IN GS25 (SUTURE) ×1 IMPLANT
SUTURE STRATFX PDO 1 14 VIOLET (SUTURE) ×1 IMPLANT
SYR 50ML LL SCALE MARK (SYRINGE) ×1 IMPLANT
TRAY FOLEY MTR SLVR 14FR STAT (SET/KITS/TRAYS/PACK) IMPLANT
TUBE SUCTION HIGH CAP CLEAR NV (SUCTIONS) ×1 IMPLANT
UNDERPAD 30X36 HEAVY ABSORB (UNDERPADS AND DIAPERS) ×1 IMPLANT
WRAP KNEE MAXI GEL POST OP (GAUZE/BANDAGES/DRESSINGS) IMPLANT

## 2023-07-02 NOTE — Discharge Instructions (Signed)

## 2023-07-02 NOTE — Transfer of Care (Signed)
Immediate Anesthesia Transfer of Care Note  Patient: Cathlene Sadosky  Procedure(s) Performed: TOTAL KNEE ARTHROPLASTY (Right: Knee)  Patient Location: PACU  Anesthesia Type:Spinal  Level of Consciousness: drowsy  Airway & Oxygen Therapy: Patient Spontanous Breathing and Patient connected to face mask oxygen  Post-op Assessment: Report given to RN and Post -op Vital signs reviewed and stable  Post vital signs: Reviewed and stable  Last Vitals:  Vitals Value Taken Time  BP 114/74 07/02/23 0945  Temp    Pulse 77 07/02/23 0945  Resp 10 07/02/23 0945  SpO2 100 % 07/02/23 0945  Vitals shown include unfiled device data.  Last Pain:  Vitals:   07/02/23 0602  TempSrc:   PainSc: 4       Patients Stated Pain Goal: 4 (07/02/23 0602)  Complications: No notable events documented.

## 2023-07-02 NOTE — Anesthesia Procedure Notes (Signed)
Spinal  Patient location during procedure: OR Start time: 07/02/2023 7:30 AM End time: 07/02/2023 7:35 AM Reason for block: surgical anesthesia Staffing Performed: anesthesiologist  Anesthesiologist: Gaynelle Adu, MD Performed by: Gaynelle Adu, MD Authorized by: Gaynelle Adu, MD   Preanesthetic Checklist Completed: patient identified, IV checked, risks and benefits discussed, surgical consent, monitors and equipment checked, pre-op evaluation and timeout performed Spinal Block Patient position: sitting Prep: DuraPrep Patient monitoring: cardiac monitor, continuous pulse ox and blood pressure Approach: midline Location: L3-4 Injection technique: single-shot Needle Needle type: Pencan  Needle gauge: 24 G Needle length: 9 cm Assessment Sensory level: T8 Events: CSF return Additional Notes Functioning IV was confirmed and monitors were applied. Sterile prep and drape, including hand hygiene and sterile gloves were used. The patient was positioned and the spine was prepped. The skin was anesthetized with lidocaine.  Free flow of clear CSF was obtained prior to injecting local anesthetic into the CSF.  The spinal needle aspirated freely following injection.  The needle was carefully withdrawn.  The patient tolerated the procedure well.

## 2023-07-02 NOTE — Evaluation (Signed)
Physical Therapy Evaluation Patient Details Name: Patricia Carrillo MRN: [id number] DOB: Feb 24, 1966 Today's Date: 07/02/2023  History of Present Illness  57 yo female presents to therapy s/p R TKA on 07/02/2023 due to failure of conservative measures. Pt PMH includes but is not limited to: lumbar DJD, L shoulder impingement syndrome s/p sx (2023), CKD III, HTN, OSA and L TKA on 02/14/2023.  Clinical Impression      Patricia Carrillo is a 57 y.o. female POD 0 s/p R TKA. Patient reports mod I with mobility at baseline. Patient is now limited by functional impairments (see PT problem list below) and requires min guard and cues for transfers and gait with RW. Patient was able to ambulate 50 feet x 2  with RW and min guard and cues for safe walker management. Patient educated on safe sequencing for stair mobility, car transfers, pain management, CP/ICE and fall risk prevention pt verbalized understanding of safe guarding position for people assisting with mobility. Patient instructed in exercises to facilitate ROM and circulation reviewed and HO provided. Patient will benefit from continued skilled PT interventions to address impairments and progress towards PLOF. Patient has met mobility goals at adequate level for discharge home with family support and OPPT; will continue to follow if pt continues acute stay to progress towards Mod I goals.     If plan is discharge home, recommend the following: A little help with walking and/or transfers;A little help with bathing/dressing/bathroom;Assistance with cooking/housework;Assist for transportation;Help with stairs or ramp for entrance   Can travel by private vehicle        Equipment Recommendations None recommended by PT  Recommendations for Other Services       Functional Status Assessment Patient has had a recent decline in their functional status and demonstrates the ability to make significant improvements in function in a reasonable and predictable amount  of time.     Precautions / Restrictions Precautions Precautions: Knee;Fall Restrictions Weight Bearing Restrictions: No      Mobility  Bed Mobility Overal bed mobility: Needs Assistance Bed Mobility: Supine to Sit     Supine to sit: Supervision     General bed mobility comments: min cues    Transfers Overall transfer level: Needs assistance Equipment used: Rolling walker (2 wheels) Transfers: Sit to/from Stand Sit to Stand: Contact guard assist           General transfer comment: min cues for bed, commode and recliner transfers    Ambulation/Gait Ambulation/Gait assistance: Contact guard assist Gait Distance (Feet): 50 Feet Assistive device: Rolling walker (2 wheels) Gait Pattern/deviations: Step-to pattern, Trunk flexed, Antalgic Gait velocity: decreased     General Gait Details: min cues for posture, pt able to progress to close S for gait tasks  Stairs Stairs: Yes Stairs assistance: Contact guard assist Stair Management: Two rails Number of Stairs: 2 General stair comments: min cues for sequencing and safety  Wheelchair Mobility     Tilt Bed    Modified Rankin (Stroke Patients Only)       Balance Overall balance assessment: Needs assistance Sitting-balance support: Feet supported Sitting balance-Leahy Scale: Good     Standing balance support: Bilateral upper extremity supported, During functional activity, Reliant on assistive device for balance Standing balance-Leahy Scale: Fair Standing balance comment: static standing no UE support                             Pertinent Vitals/Pain Pain Assessment Pain Assessment: 0-10  Pain Score: 5  Pain Location: R knee Pain Descriptors / Indicators: Aching, Tingling, Dull, Constant, Operative site guarding, Discomfort Pain Intervention(s): Limited activity within patient's tolerance, Monitored during session, Premedicated before session, Repositioned    Home Living Family/patient  expects to be discharged to:: Private residence Living Arrangements: Spouse/significant other;Children Available Help at Discharge: Family Type of Home: House Home Access: Stairs to enter Entrance Stairs-Rails: Right;Left;Can reach both Secretary/administrator of Steps: 5 Alternate Level Stairs-Number of Steps: 14 Home Layout: Two level;Bed/bath upstairs Home Equipment: Pharmacist, hospital (2 wheels)      Prior Function Prior Level of Function : Independent/Modified Independent             Mobility Comments: mod I with ADLs, self care tasks, IADLs       Extremity/Trunk Assessment        Lower Extremity Assessment Lower Extremity Assessment: RLE deficits/detail RLE Deficits / Details: ankle DF/PF 5/5; SLR < 10 degree lag RLE Sensation: decreased light touch (abn sensation pelvic region)    Cervical / Trunk Assessment Cervical / Trunk Assessment: Normal  Communication   Communication Communication: No apparent difficulties  Cognition Arousal: Alert Behavior During Therapy: WFL for tasks assessed/performed Overall Cognitive Status: Within Functional Limits for tasks assessed                                          General Comments      Exercises Total Joint Exercises Ankle Circles/Pumps: AROM, Both, 10 reps Quad Sets: AROM, Right, 5 reps Short Arc Quad: AROM, Right, 5 reps Heel Slides: AROM, Right, 5 reps Hip ABduction/ADduction: AROM, Right, 5 reps Straight Leg Raises: AROM, Right, 5 reps Knee Flexion: AROM, Right, 5 reps, Seated   Assessment/Plan    PT Assessment Patient needs continued PT services  PT Problem List Decreased strength;Decreased range of motion;Decreased activity tolerance;Decreased balance;Decreased coordination;Decreased mobility;Pain       PT Treatment Interventions DME instruction;Gait training;Stair training;Functional mobility training;Therapeutic activities;Therapeutic exercise;Balance training;Neuromuscular  re-education;Patient/family education;Modalities    PT Goals (Current goals can be found in the Care Plan section)  Acute Rehab PT Goals Patient Stated Goal: to increase IND with all mobility and IADLs and maximize B knee ROM PT Goal Formulation: With patient Time For Goal Achievement: 07/16/23 Potential to Achieve Goals: Good    Frequency 7X/week     Co-evaluation               AM-PAC PT "6 Clicks" Mobility  Outcome Measure Help needed turning from your back to your side while in a flat bed without using bedrails?: None Help needed moving from lying on your back to sitting on the side of a flat bed without using bedrails?: A Little Help needed moving to and from a bed to a chair (including a wheelchair)?: A Little Help needed standing up from a chair using your arms (e.g., wheelchair or bedside chair)?: A Little Help needed to walk in hospital room?: A Little Help needed climbing 3-5 steps with a railing? : A Little 6 Click Score: 19    End of Session Equipment Utilized During Treatment: Gait belt Activity Tolerance: Patient tolerated treatment well Patient left: in chair;with call bell/phone within reach;with nursing/sitter in room Nurse Communication: Mobility status PT Visit Diagnosis: Unsteadiness on feet (R26.81);Other abnormalities of gait and mobility (R26.89);Muscle weakness (generalized) (M62.81);Difficulty in walking, not elsewhere classified (R26.2);Pain Pain - Right/Left: Right Pain -  part of body: Leg;Knee    Time: 1213-1300 PT Time Calculation (min) (ACUTE ONLY): 47 min   Charges:   PT Evaluation $PT Eval Low Complexity: 1 Low PT Treatments $Gait Training: 8-22 mins $Therapeutic Exercise: 8-22 mins PT General Charges $$ ACUTE PT VISIT: 1 Visit         Johnny Bridge, PT Acute Rehab   Jacqualyn Posey 07/02/2023, 1:15 PM

## 2023-07-02 NOTE — Progress Notes (Signed)
Orthopedic Tech Progress Note Patient Details:  Patricia Carrillo 1966/08/13 [id number]  Ortho Devices Type of Ortho Device: Bone foam zero knee Ortho Device/Splint Interventions: Ordered     Bone foam left at bedside in PACU. Darleen Crocker 07/02/2023, 10:18 AM

## 2023-07-02 NOTE — Anesthesia Procedure Notes (Signed)
Anesthesia Regional Block: Adductor canal block   Pre-Anesthetic Checklist: , timeout performed,  Correct Patient, Correct Site, Correct Laterality,  Correct Procedure, Correct Position, site marked,  Risks and benefits discussed,  Pre-op evaluation,  At surgeon's request and post-op pain management  Laterality: Right  Prep: Maximum Sterile Barrier Precautions used, chloraprep       Needles:  Injection technique: Single-shot  Needle Type: Echogenic Stimulator Needle     Needle Length: 9cm  Needle Gauge: 21     Additional Needles:   Procedures:,,,, ultrasound used (permanent image in chart),,    Narrative:  Start time: 07/02/2023 6:54 AM End time: 07/02/2023 7:04 AM Injection made incrementally with aspirations every 5 mL.  Performed by: Personally  Anesthesiologist: Gaynelle Adu, MD

## 2023-07-02 NOTE — Op Note (Signed)
DATE OF SURGERY:  07/02/2023 TIME: 9:08 AM  PATIENT NAME:  Buckner Malta   AGE: 57 y.o.    PRE-OPERATIVE DIAGNOSIS: End-stage right knee osteoarthritis  POST-OPERATIVE DIAGNOSIS:  Same  PROCEDURE: Right press-fit total Knee Arthroplasty  SURGEON:  Ernesha Ramone A Shakala Marlatt, MD   ASSISTANT: Kathie Dike, PA-C, present and scrubbed throughout the case, critical for assistance with exposure, retraction, instrumentation, and closure.   OPERATIVE IMPLANTS:  Press-fit Zimmer persona size 7 right narrow femur with porous plasma spray coating, C right tibial baseplate with Osseo Ti coating, 32 mm Osseo Ti 3 peg press-fit patella, 10 mm MC polyethylene insert Implant Name Type Inv. Item Serial No. Manufacturer Lot No. LRB No. Used Action  COMP TIB KNEE PS C 0D RT - [id number] Knees COMP TIB KNEE PS C 0D RT  [employer](ORTH,TRAU,BIO,SG) [id number] Right 1 Implanted  COMP FEM PS KNEE NRW 7 RT - [id number] Joint COMP FEM PS KNEE NRW 7 RT  [employer](ORTH,TRAU,BIO,SG)  Right 1 Implanted  COMP PATELLA PEG 3 32 - [id number] Joint COMP PATELLA PEG 3 32  [employer](ORTH,TRAU,BIO,SG) [id number] Right 1 Implanted  LINER ASF PERS 10X6/7 CD RT - [id number] Liner LINER ASF PERS 10X6/7 CD RT  [employer](ORTH,TRAU,BIO,SG) [id number] Right 1 Implanted      PREOPERATIVE INDICATIONS:  Donene Lasko is a 57 y.o. year old female with end stage bone on bone degenerative arthritis of the knee who failed conservative treatment, including injections, antiinflammatories, activity modification, and assistive devices, and had significant impairment of their activities of daily living, and elected for Total Knee Arthroplasty.   The risks, benefits, and alternatives were discussed at length including but not limited to the risks of infection, bleeding, nerve injury, stiffness, blood clots, the need for revision surgery, cardiopulmonary complications, among others, and they were willing to proceed.  ESTIMATED BLOOD  LOSS: 25cc  OPERATIVE DESCRIPTION:   Once adequate anesthesia was induced, preoperative antibiotics, 2 gm of ancef,1 gm of Tranexamic Acid, and 8 mg of Decadron administered, the patient was positioned supine with a right thigh tourniquet placed.  The right lower extremity was prepped and draped in sterile fashion.  A time-  out was performed identifying the patient, planned procedure, and the appropriate extremity.     The leg was  exsanguinated, tourniquet elevated to 250 mmHg.  A midline incision was  made followed by median parapatellar arthrotomy. Anterior horn of the medial meniscus was released and resected. A medial release was performed, the infrapatellar fat pad was resected with care taken to protect the patellar tendon. The suprapatellar fat was removed to exposed the distal anterior femur. The anterior horn of the lateral meniscus and ACL were released.    Following initial  exposure, I first started with the femur  The femoral  canal was opened with a drill, canal was suctioned to try to prevent fat emboli.  An  intramedullary rod was passed set at 3 degrees valgus, 10mm. The distal femur was resected.  Following this resection, the tibia was  subluxated anteriorly.  Using the extramedullary guide, 10mm of bone was resected off the proximal lateral tibia.  We confirmed the gap would be  stable medially and laterally with a size 10mm spacer block as well as confirmed that the tibial cut was perpendicular in the coronal plane, checking with an alignment rod.    Once this was done, the posterior femoral referencing femoral sizer was placed under to the posterior condyles with 3 degrees of external rotational which was  parallel to the transepicondylar axis and perpendicular to [employer]. The femur was sized to be a size 7 in the anterior-  posterior dimension. The  anterior, posterior, and  chamfer cuts were made without difficulty nor notching making certain that I was along the anterior  cortex to help  with flexion gap stability. Next a laminar spreader was placed with the knee in flexion and the medial lateral menisci were resected.  5 cc of the Exparel mixture was injected in the medial side of the back of the knee and 3 cc in the lateral side.  1/2 inch curved osteotome was used to resect posterior osteophyte that was then removed with a pituitary rongeur.       At this point, the tibia was sized to be a size C.  The size C tray was  then pinned in position. Trial reduction was now carried with a 7 femur, C tibia, a 10mm MC insert.  The knee had full extension and was stable to varus valgus stress in extension.  The knee was stable in flexion and the PCL was left intact.   Attention was next directed to the patella.  Precut  measurement was noted to be 23 mm.  I resected down to 14 mm and used a  32mm patellar button to restore patellar height as well as cover the cut surface.     The patella lug holes were drilled and a 32mm patella poly trial was placed.    The knee was brought to full extension with good flexion stability with the patella tracking through the trochlea without application of pressure.    Next the femoral component was again assessed and determined to be seated and appropriately lateralized.  The femoral lug holes were drilled.  The femoral component was then removed. Tibial component was again assessed and felt to be seated and appropriately rotated with the medial third of the tubercle. The tibia was then drilled, and keel punched.     Final components were  opened and impacted into place.   The knee was irrigated with sterile Betadine diluted in saline as well as pulse lavage normal saline. The synovial lining was  then injected a dilute Exparel with 30cc of 0.25% marcaine with epinephrine.     I confirmed that I was satisfied with the range of motion and stability, and the final 10mm MC poly insert was chosen.  It was placed into the knee.         The  tourniquet had been let down at 57 minutes.  No significant hemostasis was required.  The medial parapatellar arthrotomy was then reapproximated using #1 Stratafix sutures with the knee  in flexion.  The remaining wound was closed with 0 stratafix, 2-0 Vicryl, and running 3-0 Monocryl. The knee was cleaned, dried, dressed sterilely using Dermabond and   Aquacel dressing.  The patient was then brought to recovery room in stable condition, tolerating the procedure  well. There were no complications.   Post op recs: WB: WBAT Abx: ancef Imaging: PACU xrays DVT prophylaxis: Aspirin 81mg  BID x4 weeks Follow up: 2 weeks after surgery for a wound check with Dr. Blanchie Dessert at [facility].  Address: [address], [place], [place] [address]  Office Phone: [phone]  Weber Cooks, MD Orthopaedic Surgery             2

## 2023-07-02 NOTE — Anesthesia Postprocedure Evaluation (Signed)
Anesthesia Post Note  Patient: [name]  Procedure(s) Performed: TOTAL KNEE ARTHROPLASTY (Right: Knee)     Patient location during evaluation: PACU Anesthesia Type: Spinal and Regional Level of consciousness: oriented and awake and alert Pain management: pain level controlled Vital Signs Assessment: post-procedure vital signs reviewed and stable Respiratory status: spontaneous breathing, respiratory function stable and patient connected to nasal cannula oxygen Cardiovascular status: blood pressure returned to baseline and stable Postop Assessment: no headache, no backache, no apparent nausea or vomiting, spinal receding and patient able to bend at knees Anesthetic complications: no  No notable events documented.  Last Vitals:  Vitals:   07/02/23 1100 07/02/23 1105  BP: (!) 149/88   Pulse: 70   Resp: 10   Temp:    SpO2: (!) 89% 100%    Last Pain:  Vitals:   07/02/23 1100  TempSrc:   PainSc: 6             L Sensory Level: L2-Upper inner thigh, upper buttock (07/02/23 1100) R Sensory Level: S1-Sole of foot, small toes (07/02/23 1100)  Lakashia Collison,W. EDMOND

## 2023-07-02 NOTE — Interval H&P Note (Signed)

## 2023-07-03 ENCOUNTER — Encounter (HOSPITAL_COMMUNITY): Payer: Self-pay | Admitting: Orthopedic Surgery

## 2023-07-09 ENCOUNTER — Other Ambulatory Visit: Payer: Self-pay

## 2023-07-09 ENCOUNTER — Ambulatory Visit: Payer: Medicaid Other | Attending: Orthopedic Surgery

## 2023-07-09 DIAGNOSIS — M25661 Stiffness of right knee, not elsewhere classified: Secondary | ICD-10-CM | POA: Diagnosis present

## 2023-07-09 DIAGNOSIS — R600 Localized edema: Secondary | ICD-10-CM | POA: Diagnosis present

## 2023-07-09 DIAGNOSIS — M25561 Pain in right knee: Secondary | ICD-10-CM | POA: Diagnosis present

## 2023-07-09 NOTE — Therapy (Signed)
OUTPATIENT PHYSICAL THERAPY LOWER EXTREMITY EVALUATION   Patient Name: Patricia Carrillo MRN: [id number] DOB:1966/04/22, 57 y.o., female Today's Date: 07/09/2023  END OF SESSION:  PT End of Session - 07/09/23 1101     Visit Number 1    Number of Visits 12    Date for PT Re-Evaluation 09/07/23    PT Start Time 1103    PT Stop Time 1135    PT Time Calculation (min) 32 min    Activity Tolerance Patient tolerated treatment well    Behavior During Therapy WFL for tasks assessed/performed             Past Medical History:  Diagnosis Date   Anxiety    Arthritis    Chronic kidney disease    "stage 3" per pt   Fatigue    Headache    History of kidney stones    Hypertension    Obesity 01/16/2022   takes metformin and ozempic "for weight loss, not diabetic"   Pre-diabetes    Sleep apnea    dx 15 years ago, never used CPAP   Past Surgical History:  Procedure Laterality Date   CHOLECYSTECTOMY     LAPAROSCOPIC HYSTERECTOMY     muiltiple knee arthroscopies bilateral      REVERSE SHOULDER ARTHROPLASTY Left 08/17/2022   Procedure: REVERSE SHOULDER ARTHROPLASTY;  Surgeon: Bjorn Pippin, MD;  Location: [facility];  Service: Orthopedics;  Laterality: Left;   SHOULDER ARTHROSCOPY WITH DISTAL CLAVICLE RESECTION Left 01/26/2022   Procedure: SHOULDER ARTHROSCOPY WITH DISTAL CLAVICLE EXCISION/ DEBRIDEMENT;  Surgeon: Bjorn Pippin, MD;  Location: [facility];  Service: Orthopedics;  Laterality: Left;   SHOULDER ARTHROSCOPY WITH SUBACROMIAL DECOMPRESSION, ROTATOR CUFF REPAIR AND BICEP TENDON REPAIR Left 01/26/2022   Procedure: SHOULDER ARTHROSCOPY WITH SUBACROMIAL DECOMPRESSION, ROTATOR CUFF REPAIR;  Surgeon: Bjorn Pippin, MD;  Location: [facility];  Service: Orthopedics;  Laterality: Left;   TONSILLECTOMY     TOTAL KNEE ARTHROPLASTY Left 02/14/2023   Procedure: TOTAL KNEE ARTHROPLASTY;  Surgeon: Joen Laura, MD;  Location: [facility];  Service:  Orthopedics;  Laterality: Left;   TOTAL KNEE ARTHROPLASTY Right 07/02/2023   Procedure: TOTAL KNEE ARTHROPLASTY;  Surgeon: Joen Laura, MD;  Location: [facility];  Service: Orthopedics;  Laterality: Right;   TUBAL LIGATION     Patient Active Problem List   Diagnosis Date Noted   Chronic right shoulder pain 02/08/2023   Lumbar degenerative disc disease 11/30/2021   Status post total shoulder arthroplasty, left 03/14/2021   Primary osteoarthritis of both knees 03/14/2021    PCP: April Manson, NP  REFERRING PROVIDER: Joen Laura, MD  REFERRING DIAG: POST OP RIGHT TOTAL KNEE REPLACEMENT  THERAPY DIAG:  Acute pain of right knee  Stiffness of right knee, not elsewhere classified  Localized edema  Rationale for Evaluation and Treatment: Rehabilitation  ONSET DATE: 07/02/23  SUBJECTIVE:   SUBJECTIVE STATEMENT: Patient reports that she had her right knee replaced on 07/02/23. She notes that it has been sore, swollen, and painful. She has tried using ice, but it is not helping as much as it did with her last knee replacement. She notes that she has not been doing her HEP and this is the most walking she has done since surgery.   PERTINENT HISTORY: Osteoarthritis, anxiety, chronic kidney disease, and hypertension PAIN:  Are you having pain? Yes: NPRS scale: 10/10 Pain location: right thigh radiating down to her right ankle Pain description: constant aching  and throbbing  Aggravating factors: none known Relieving factors: none   PRECAUTIONS: None  RED FLAGS: None   WEIGHT BEARING RESTRICTIONS: No  FALLS:  Has patient fallen in last 6 months? No  LIVING ENVIRONMENT: Lives with: lives with their family Lives in: House/apartment Stairs: Yes: Internal: 12-14 steps; on right going up and External: 7 steps; can reach both; patient reports a step to pattern ascending stairs and sitting down and sliding down her stairs to go down Has following equipment at home:  Walker - 2 wheeled  OCCUPATION: not working  PLOF: Independent  PATIENT GOALS: reduced pain, improved mobility, and be able to watch her grandchildren (the oldest is 8 years old and the youngest is 57 years old)   NEXT MD VISIT: 07/17/23  OBJECTIVE:   DIAGNOSTIC FINDINGS: 07/02/23 right knee x-ray IMPRESSION: Right knee arthroplasty without immediate postoperative complication.  PATIENT SURVEYS:  LEFS 16/80  COGNITION: Overall cognitive status: Within functional limits for tasks assessed     SENSATION: Patient reports no numbness or tingling  EDEMA:  Circumferential: Right tibiofemoral joint line: 46.5 cm Left tibiofemoral joint line: 42.0 cm   PALPATION: TTP: right hamstrings insertion, gastrocnemius/soleus, and medial and lateral joint lines  LOWER EXTREMITY ROM:  Active ROM Right eval Left eval  Hip flexion    Hip extension    Hip abduction    Hip adduction    Hip internal rotation    Hip external rotation    Knee flexion 73/ 84; limited by pain  93  Knee extension 14; limited by pain  0  Ankle dorsiflexion    Ankle plantarflexion    Ankle inversion    Ankle eversion     (Blank rows = not tested)  LOWER EXTREMITY MMT: not tested due to surgical condition  LOWER EXTREMITY SPECIAL TESTS:  Not tested due to surgical condition  FUNCTIONAL TESTS:  Significant difficulty with sit to stand transfers  GAIT: Assistive device utilized: Environmental consultant - 2 wheeled Level of assistance: Modified independence Comments: decreased gait speed and stride length   TODAY'S TREATMENT:                                                                                                                              DATE:    PATIENT EDUCATION:  Education details: POC, benefits of HEP, healing, prognosis, and goals for therapy Person educated: Patient Education method: Explanation Education comprehension: verbalized understanding  HOME EXERCISE  PROGRAM:   ASSESSMENT:  CLINICAL IMPRESSION: Patient is a 57 y.o. female who was seen today for physical therapy evaluation and treatment following a right total knee arthroplasty on 07/02/23. She presented with high pain severity and irritability with right knee mobility being the most aggravating to her familiar pain. She also exhibited increased right knee edema. However, she exhibited no other signs or symptoms of a DVT or other postoperative complication. Recommend that she continue with skilled physical therapy to address her remaining impairments to  return to her prior level of function.    OBJECTIVE IMPAIRMENTS: Abnormal gait, decreased activity tolerance, decreased mobility, difficulty walking, decreased ROM, decreased strength, hypomobility, increased edema, impaired tone, and pain.   ACTIVITY LIMITATIONS: carrying, lifting, sitting, standing, squatting, sleeping, stairs, transfers, bed mobility, and locomotion level  PARTICIPATION LIMITATIONS: meal prep, cleaning, laundry, driving, shopping, community activity, and yard work  PERSONAL FACTORS: Transportation and 3+ comorbidities: Osteoarthritis, anxiety, chronic kidney disease, and hypertension  are also affecting patient's functional outcome.   REHAB POTENTIAL: Good  CLINICAL DECISION MAKING: Evolving/moderate complexity  EVALUATION COMPLEXITY: Moderate   GOALS: Goals reviewed with patient? Yes  SHORT TERM GOALS: Target date: 07/30/23 Patient will be independent with her initial HEP.  Baseline: Goal status: INITIAL  2.  Patient will be able to improve her right knee extension within 10 degrees of neutral for improved gait mechanics.  Baseline:  Goal status: INITIAL  3.  Patient will be able to demonstrate right knee flexion to 85 degrees or greater for improved function squatting.  Baseline:  Goal status: INITIAL  4.  Patient will improve her LEFS score to 26/80 or greater for improved perceived functional mobility.   Baseline:  Goal status: INITIAL  LONG TERM GOALS: Target date: 08/20/23  Patient will be independent with her advanced HEP.  Baseline:  Goal status: INITIAL  2.  Patient will improve her active right knee extension within 5 degrees of neutral for improved gait mechanics.  Baseline:  Goal status: INITIAL  3.  Patient will improve her right knee flexion to 105 degrees or greater for improved function navigating stairs.  Baseline:  Goal status: INITIAL  4.  Patient will be able to descend stairs with at least a step to pattern for improved household mobility.  Baseline:  Goal status: INITIAL  5.  Patient will improve her LEFS score to 36/80 or greater for improved function with her daily activities.  Baseline:  Goal status: INITIAL  PLAN:  PT FREQUENCY: 2x/week  PT DURATION: 6 weeks  PLANNED INTERVENTIONS: Therapeutic exercises, Therapeutic activity, Neuromuscular re-education, Balance training, Gait training, Patient/Family education, Self Care, Joint mobilization, Stair training, Manual therapy, and Re-evaluation  PLAN FOR NEXT SESSION: NuStep, quad set, heel slide, manual therapy, and update HEP as needed   Granville Lewis, PT 07/09/2023, 2:43 PM

## 2023-07-13 ENCOUNTER — Ambulatory Visit: Payer: Medicaid Other | Attending: Orthopedic Surgery

## 2023-07-13 DIAGNOSIS — M25561 Pain in right knee: Secondary | ICD-10-CM | POA: Insufficient documentation

## 2023-07-13 DIAGNOSIS — M25661 Stiffness of right knee, not elsewhere classified: Secondary | ICD-10-CM | POA: Insufficient documentation

## 2023-07-13 DIAGNOSIS — R600 Localized edema: Secondary | ICD-10-CM | POA: Diagnosis present

## 2023-07-13 NOTE — Therapy (Signed)
OUTPATIENT PHYSICAL THERAPY LOWER EXTREMITY TREATMENT   Patient Name: Patricia Carrillo MRN: [id number] DOB:09/15/1966, 57 y.o., female Today's Date: 07/13/2023  END OF SESSION:  PT End of Session - 07/13/23 0804     Visit Number 2    Number of Visits 12    Date for PT Re-Evaluation 09/07/23    PT Start Time 0800    PT Stop Time 0849    PT Time Calculation (min) 49 min    Activity Tolerance Patient tolerated treatment well    Behavior During Therapy [facility] for tasks assessed/performed             Past Medical History:  Diagnosis Date   Anxiety    Arthritis    Chronic kidney disease    "stage 3" per pt   Fatigue    Headache    History of kidney stones    Hypertension    Obesity 01/16/2022   takes metformin and ozempic "for weight loss, not diabetic"   Pre-diabetes    Sleep apnea    dx 15 years ago, never used CPAP   Past Surgical History:  Procedure Laterality Date   CHOLECYSTECTOMY     LAPAROSCOPIC HYSTERECTOMY     muiltiple knee arthroscopies bilateral      REVERSE SHOULDER ARTHROPLASTY Left 08/17/2022   Procedure: REVERSE SHOULDER ARTHROPLASTY;  Surgeon: Bjorn Pippin, MD;  Location: [facility];  Service: Orthopedics;  Laterality: Left;   SHOULDER ARTHROSCOPY WITH DISTAL CLAVICLE RESECTION Left 01/26/2022   Procedure: SHOULDER ARTHROSCOPY WITH DISTAL CLAVICLE EXCISION/ DEBRIDEMENT;  Surgeon: Bjorn Pippin, MD;  Location: [facility];  Service: Orthopedics;  Laterality: Left;   SHOULDER ARTHROSCOPY WITH SUBACROMIAL DECOMPRESSION, ROTATOR CUFF REPAIR AND BICEP TENDON REPAIR Left 01/26/2022   Procedure: SHOULDER ARTHROSCOPY WITH SUBACROMIAL DECOMPRESSION, ROTATOR CUFF REPAIR;  Surgeon: Bjorn Pippin, MD;  Location: [facility];  Service: Orthopedics;  Laterality: Left;   TONSILLECTOMY     TOTAL KNEE ARTHROPLASTY Left 02/14/2023   Procedure: TOTAL KNEE ARTHROPLASTY;  Surgeon: Joen Laura, MD;  Location: [facility];  Service:  Orthopedics;  Laterality: Left;   TOTAL KNEE ARTHROPLASTY Right 07/02/2023   Procedure: TOTAL KNEE ARTHROPLASTY;  Surgeon: Joen Laura, MD;  Location: [facility];  Service: Orthopedics;  Laterality: Right;   TUBAL LIGATION     Patient Active Problem List   Diagnosis Date Noted   Chronic right shoulder pain 02/08/2023   Lumbar degenerative disc disease 11/30/2021   Status post total shoulder arthroplasty, left 03/14/2021   Primary osteoarthritis of both knees 03/14/2021    PCP: April Manson, NP  REFERRING PROVIDER: Joen Laura, MD  REFERRING DIAG: POST OP RIGHT TOTAL KNEE REPLACEMENT  THERAPY DIAG:  Stiffness of right knee, not elsewhere classified  Acute pain of right knee  Localized edema  Rationale for Evaluation and Treatment: Rehabilitation  ONSET DATE: 07/02/23  SUBJECTIVE:   SUBJECTIVE STATEMENT: Pt reports 6/10 right knee pain today.  Pt reports pain is keeping her awake at night.   PERTINENT HISTORY: Osteoarthritis, anxiety, chronic kidney disease, and hypertension PAIN:  Are you having pain? Yes: NPRS scale: 10/10 Pain location: right thigh radiating down to her right ankle Pain description: constant aching and throbbing  Aggravating factors: none known Relieving factors: none   PRECAUTIONS: None  RED FLAGS: None   WEIGHT BEARING RESTRICTIONS: No  FALLS:  Has patient fallen in last 6 months? No  LIVING ENVIRONMENT: Lives with: lives with their family Lives  in: House/apartment Stairs: Yes: Internal: 12-14 steps; on right going up and External: 7 steps; can reach both; patient reports a step to pattern ascending stairs and sitting down and sliding down her stairs to go down Has following equipment at home: Walker - 2 wheeled  OCCUPATION: not working  PLOF: Independent  PATIENT GOALS: reduced pain, improved mobility, and be able to watch her grandchildren (the oldest is 57 years old and the youngest is 57 years old)   NEXT MD  VISIT: 07/17/23  OBJECTIVE:   DIAGNOSTIC FINDINGS: 07/02/23 right knee x-ray IMPRESSION: Right knee arthroplasty without immediate postoperative complication.  PATIENT SURVEYS:  LEFS 16/80  COGNITION: Overall cognitive status: Within functional limits for tasks assessed     SENSATION: Patient reports no numbness or tingling  EDEMA:  Circumferential: Right tibiofemoral joint line: 46.5 cm Left tibiofemoral joint line: 42.0 cm   PALPATION: TTP: right hamstrings insertion, gastrocnemius/soleus, and medial and lateral joint lines  LOWER EXTREMITY ROM:  Active ROM Right eval Left eval  Hip flexion    Hip extension    Hip abduction    Hip adduction    Hip internal rotation    Hip external rotation    Knee flexion 73/ 84; limited by pain  93  Knee extension 14; limited by pain  0  Ankle dorsiflexion    Ankle plantarflexion    Ankle inversion    Ankle eversion     (Blank rows = not tested)  LOWER EXTREMITY MMT: not tested due to surgical condition  LOWER EXTREMITY SPECIAL TESTS:  Not tested due to surgical condition  FUNCTIONAL TESTS:  Significant difficulty with sit to stand transfers  GAIT: Assistive device utilized: Environmental consultant - 2 wheeled Level of assistance: Modified independence Comments: decreased gait speed and stride length   TODAY'S TREATMENT:                                                                                                                              DATE: 07/13/23                                    EXERCISE LOG  Exercise Repetitions and Resistance Comments  Nustep Lvl 2 x 15; seat 8 - 6   QS 3 mins w/ 3 sec hold   HS 3 mins w/ 3 sec hold   Ankle Pumps 3 mins   SAQ 3 mins   Heel Slides Heel Glide x 2 mins    Blank cell = exercise not performed today   Manual Therapy Soft Tissue Mobilization: right knee, STW/M to right medial and lateral knee to decrease pain and tone    PATIENT EDUCATION:  Education details: POC, benefits of HEP,  healing, prognosis, and goals for therapy Person educated: Patient Education method: Explanation Education comprehension: verbalized understanding  HOME EXERCISE PROGRAM:   ASSESSMENT:  CLINICAL IMPRESSION: Pt arrives  for today's treatment session reporting 6/10 right knee pain. Pt able to tolerate Nustep for warm up today with this PTA being able to progress seat forward two positions to increase ROM.  Pt instructed in several supine exercises to increase ROM, strength, and function.  Pt requiring cues for proper technique and hold time.  STW/M performed to medial and lateral right knee to decrease pain and tone.  Pt reported 4/10 right knee pain at completion of today's treatment session.   OBJECTIVE IMPAIRMENTS: Abnormal gait, decreased activity tolerance, decreased mobility, difficulty walking, decreased ROM, decreased strength, hypomobility, increased edema, impaired tone, and pain.   ACTIVITY LIMITATIONS: carrying, lifting, sitting, standing, squatting, sleeping, stairs, transfers, bed mobility, and locomotion level  PARTICIPATION LIMITATIONS: meal prep, cleaning, laundry, driving, shopping, community activity, and yard work  PERSONAL FACTORS: Transportation and 3+ comorbidities: Osteoarthritis, anxiety, chronic kidney disease, and hypertension  are also affecting patient's functional outcome.   REHAB POTENTIAL: Good  CLINICAL DECISION MAKING: Evolving/moderate complexity  EVALUATION COMPLEXITY: Moderate   GOALS: Goals reviewed with patient? Yes  SHORT TERM GOALS: Target date: 07/30/23 Patient will be independent with her initial HEP.  Baseline: Goal status: INITIAL  2.  Patient will be able to improve her right knee extension within 10 degrees of neutral for improved gait mechanics.  Baseline:  Goal status: INITIAL  3.  Patient will be able to demonstrate right knee flexion to 85 degrees or greater for improved function squatting.  Baseline:  Goal status:  INITIAL  4.  Patient will improve her LEFS score to 26/80 or greater for improved perceived functional mobility.  Baseline:  Goal status: INITIAL  LONG TERM GOALS: Target date: 08/20/23  Patient will be independent with her advanced HEP.  Baseline:  Goal status: INITIAL  2.  Patient will improve her active right knee extension within 5 degrees of neutral for improved gait mechanics.  Baseline:  Goal status: INITIAL  3.  Patient will improve her right knee flexion to 105 degrees or greater for improved function navigating stairs.  Baseline:  Goal status: INITIAL  4.  Patient will be able to descend stairs with at least a step to pattern for improved household mobility.  Baseline:  Goal status: INITIAL  5.  Patient will improve her LEFS score to 36/80 or greater for improved function with her daily activities.  Baseline:  Goal status: INITIAL  PLAN:  PT FREQUENCY: 2x/week  PT DURATION: 6 weeks  PLANNED INTERVENTIONS: Therapeutic exercises, Therapeutic activity, Neuromuscular re-education, Balance training, Gait training, Patient/Family education, Self Care, Joint mobilization, Stair training, Manual therapy, and Re-evaluation  PLAN FOR NEXT SESSION: NuStep, quad set, heel slide, manual therapy, and update HEP as needed   Newman Pies, PTA 07/13/2023, 9:38 AM

## 2023-07-16 ENCOUNTER — Ambulatory Visit: Payer: Medicaid Other

## 2023-07-16 DIAGNOSIS — M25661 Stiffness of right knee, not elsewhere classified: Secondary | ICD-10-CM

## 2023-07-16 DIAGNOSIS — R600 Localized edema: Secondary | ICD-10-CM

## 2023-07-16 DIAGNOSIS — M25561 Pain in right knee: Secondary | ICD-10-CM

## 2023-07-16 NOTE — Therapy (Signed)
OUTPATIENT PHYSICAL THERAPY LOWER EXTREMITY TREATMENT   Patient Name: Patricia Carrillo MRN: [id number] DOB:August 17, 1966, 57 y.o., female Today's Date: 07/16/2023  END OF SESSION:  PT End of Session - 07/16/23 0803     Visit Number 3    Number of Visits 12    Date for PT Re-Evaluation 09/07/23    PT Start Time 0800    PT Stop Time 0855    PT Time Calculation (min) 55 min    Activity Tolerance Patient tolerated treatment well    Behavior During Therapy [facility] for tasks assessed/performed             Past Medical History:  Diagnosis Date   Anxiety    Arthritis    Chronic kidney disease    "stage 3" per pt   Fatigue    Headache    History of kidney stones    Hypertension    Obesity 01/16/2022   takes metformin and ozempic "for weight loss, not diabetic"   Pre-diabetes    Sleep apnea    dx 15 years ago, never used CPAP   Past Surgical History:  Procedure Laterality Date   CHOLECYSTECTOMY     LAPAROSCOPIC HYSTERECTOMY     muiltiple knee arthroscopies bilateral      REVERSE SHOULDER ARTHROPLASTY Left 08/17/2022   Procedure: REVERSE SHOULDER ARTHROPLASTY;  Surgeon: Bjorn Pippin, MD;  Location: [facility];  Service: Orthopedics;  Laterality: Left;   SHOULDER ARTHROSCOPY WITH DISTAL CLAVICLE RESECTION Left 01/26/2022   Procedure: SHOULDER ARTHROSCOPY WITH DISTAL CLAVICLE EXCISION/ DEBRIDEMENT;  Surgeon: Bjorn Pippin, MD;  Location: [facility];  Service: Orthopedics;  Laterality: Left;   SHOULDER ARTHROSCOPY WITH SUBACROMIAL DECOMPRESSION, ROTATOR CUFF REPAIR AND BICEP TENDON REPAIR Left 01/26/2022   Procedure: SHOULDER ARTHROSCOPY WITH SUBACROMIAL DECOMPRESSION, ROTATOR CUFF REPAIR;  Surgeon: Bjorn Pippin, MD;  Location: [facility];  Service: Orthopedics;  Laterality: Left;   TONSILLECTOMY     TOTAL KNEE ARTHROPLASTY Left 02/14/2023   Procedure: TOTAL KNEE ARTHROPLASTY;  Surgeon: Joen Laura, MD;  Location: [facility];  Service:  Orthopedics;  Laterality: Left;   TOTAL KNEE ARTHROPLASTY Right 07/02/2023   Procedure: TOTAL KNEE ARTHROPLASTY;  Surgeon: Joen Laura, MD;  Location: [facility];  Service: Orthopedics;  Laterality: Right;   TUBAL LIGATION     Patient Active Problem List   Diagnosis Date Noted   Chronic right shoulder pain 02/08/2023   Lumbar degenerative disc disease 11/30/2021   Status post total shoulder arthroplasty, left 03/14/2021   Primary osteoarthritis of both knees 03/14/2021    PCP: April Manson, NP  REFERRING PROVIDER: Joen Laura, MD  REFERRING DIAG: POST OP RIGHT TOTAL KNEE REPLACEMENT  THERAPY DIAG:  Stiffness of right knee, not elsewhere classified  Acute pain of right knee  Localized edema  Rationale for Evaluation and Treatment: Rehabilitation  ONSET DATE: 07/02/23  SUBJECTIVE:   SUBJECTIVE STATEMENT: Pt reports 8/10 right knee pain today.  Pt reports having difficulty sleeping.  PERTINENT HISTORY: Osteoarthritis, anxiety, chronic kidney disease, and hypertension PAIN:  Are you having pain? Yes: NPRS scale: 8/10 Pain location: right thigh radiating down to her right ankle Pain description: constant aching and throbbing  Aggravating factors: none known Relieving factors: none   PRECAUTIONS: None  RED FLAGS: None   WEIGHT BEARING RESTRICTIONS: No  FALLS:  Has patient fallen in last 6 months? No  LIVING ENVIRONMENT: Lives with: lives with their family Lives in: House/apartment Stairs: Yes: Internal:  12-14 steps; on right going up and External: 7 steps; can reach both; patient reports a step to pattern ascending stairs and sitting down and sliding down her stairs to go down Has following equipment at home: Walker - 2 wheeled  OCCUPATION: not working  PLOF: Independent  PATIENT GOALS: reduced pain, improved mobility, and be able to watch her grandchildren (the oldest is 62 years old and the youngest is 57 years old)   NEXT MD VISIT:  07/17/23  OBJECTIVE:   DIAGNOSTIC FINDINGS: 07/02/23 right knee x-ray IMPRESSION: Right knee arthroplasty without immediate postoperative complication.  PATIENT SURVEYS:  LEFS 16/80  COGNITION: Overall cognitive status: Within functional limits for tasks assessed     SENSATION: Patient reports no numbness or tingling  EDEMA:  Circumferential: Right tibiofemoral joint line: 46.5 cm Left tibiofemoral joint line: 42.0 cm   PALPATION: TTP: right hamstrings insertion, gastrocnemius/soleus, and medial and lateral joint lines  LOWER EXTREMITY ROM:  Active ROM Right eval Left eval  Hip flexion    Hip extension    Hip abduction    Hip adduction    Hip internal rotation    Hip external rotation    Knee flexion 73/ 84; limited by pain  93  Knee extension 14; limited by pain  0  Ankle dorsiflexion    Ankle plantarflexion    Ankle inversion    Ankle eversion     (Blank rows = not tested)  LOWER EXTREMITY MMT: not tested due to surgical condition  LOWER EXTREMITY SPECIAL TESTS:  Not tested due to surgical condition  FUNCTIONAL TESTS:  Significant difficulty with sit to stand transfers  GAIT: Assistive device utilized: Environmental consultant - 2 wheeled Level of assistance: Modified independence Comments: decreased gait speed and stride length   TODAY'S TREATMENT:                                                                                                                              DATE: 07/16/23                                    EXERCISE LOG  Exercise Repetitions and Resistance Comments  Nustep Lvl 2 x 20; seat 7 - 6   QS 3 mins   HS 3 mins   Ankle Pumps 3 mins   SAQ 4 mins   Heel Slides Heel Glide x 4 mins    Blank cell = exercise not performed today   Manual Therapy Soft Tissue Mobilization: right knee, STW/M to right medial and lateral knee to decrease pain and tone    PATIENT EDUCATION:  Education details: POC, benefits of HEP, healing, prognosis, and goals for  therapy Person educated: Patient Education method: Explanation Education comprehension: verbalized understanding  HOME EXERCISE PROGRAM:   ASSESSMENT:  CLINICAL IMPRESSION: Pt arrives for today's treatment session reporting 8/10 right knee pain.  Pt reports that  she having increased difficulty sleeping due to pain level.  Pt able to tolerate increased time with all exercises performed today.  Pt able to demonstrate 80 degrees of active right knee flexion today.  STW/M performed to medial and lateral right knee to decrease pain and tone.  Pt reported 6/10 right knee pain at completion of today's treatment session.   OBJECTIVE IMPAIRMENTS: Abnormal gait, decreased activity tolerance, decreased mobility, difficulty walking, decreased ROM, decreased strength, hypomobility, increased edema, impaired tone, and pain.   ACTIVITY LIMITATIONS: carrying, lifting, sitting, standing, squatting, sleeping, stairs, transfers, bed mobility, and locomotion level  PARTICIPATION LIMITATIONS: meal prep, cleaning, laundry, driving, shopping, community activity, and yard work  PERSONAL FACTORS: Transportation and 3+ comorbidities: Osteoarthritis, anxiety, chronic kidney disease, and hypertension  are also affecting patient's functional outcome.   REHAB POTENTIAL: Good  CLINICAL DECISION MAKING: Evolving/moderate complexity  EVALUATION COMPLEXITY: Moderate   GOALS: Goals reviewed with patient? Yes  SHORT TERM GOALS: Target date: 07/30/23 Patient will be independent with her initial HEP.  Baseline: Goal status: INITIAL  2.  Patient will be able to improve her right knee extension within 10 degrees of neutral for improved gait mechanics.  Baseline:  Goal status: INITIAL  3.  Patient will be able to demonstrate right knee flexion to 85 degrees or greater for improved function squatting.  Baseline:  Goal status: INITIAL  4.  Patient will improve her LEFS score to 26/80 or greater for improved  perceived functional mobility.  Baseline:  Goal status: INITIAL  LONG TERM GOALS: Target date: 08/20/23  Patient will be independent with her advanced HEP.  Baseline:  Goal status: INITIAL  2.  Patient will improve her active right knee extension within 5 degrees of neutral for improved gait mechanics.  Baseline:  Goal status: INITIAL  3.  Patient will improve her right knee flexion to 105 degrees or greater for improved function navigating stairs.  Baseline:  Goal status: INITIAL  4.  Patient will be able to descend stairs with at least a step to pattern for improved household mobility.  Baseline:  Goal status: INITIAL  5.  Patient will improve her LEFS score to 36/80 or greater for improved function with her daily activities.  Baseline:  Goal status: INITIAL  PLAN:  PT FREQUENCY: 2x/week  PT DURATION: 6 weeks  PLANNED INTERVENTIONS: Therapeutic exercises, Therapeutic activity, Neuromuscular re-education, Balance training, Gait training, Patient/Family education, Self Care, Joint mobilization, Stair training, Manual therapy, and Re-evaluation  PLAN FOR NEXT SESSION: NuStep, quad set, heel slide, manual therapy, and update HEP as needed   Newman Pies, PTA 07/16/2023, 8:57 AM

## 2023-07-19 ENCOUNTER — Ambulatory Visit: Payer: Medicaid Other

## 2023-07-19 DIAGNOSIS — M25561 Pain in right knee: Secondary | ICD-10-CM

## 2023-07-19 DIAGNOSIS — M25661 Stiffness of right knee, not elsewhere classified: Secondary | ICD-10-CM | POA: Diagnosis not present

## 2023-07-19 DIAGNOSIS — R600 Localized edema: Secondary | ICD-10-CM

## 2023-07-19 NOTE — Therapy (Signed)
OUTPATIENT PHYSICAL THERAPY LOWER EXTREMITY TREATMENT   Patient Name: Patricia Carrillo MRN: [id number] DOB:[birth year], 57 y.o., female Today's Date: 07/19/2023  END OF SESSION:  PT End of Session - 07/19/23 1105     Visit Number 4    Number of Visits 12    Date for PT Re-Evaluation 09/07/23    PT Start Time 1100    PT Stop Time 1145    PT Time Calculation (min) 45 min    Activity Tolerance Patient tolerated treatment well    Behavior During Therapy [facility] for tasks assessed/performed              Past Medical History:  Diagnosis Date   Anxiety    Arthritis    Chronic kidney disease    "stage 3" per pt   Fatigue    Headache    History of kidney stones    Hypertension    Obesity 01/16/2022   takes metformin and ozempic "for weight loss, not diabetic"   Pre-diabetes    Sleep apnea    dx 15 years ago, never used CPAP   Past Surgical History:  Procedure Laterality Date   CHOLECYSTECTOMY     LAPAROSCOPIC HYSTERECTOMY     muiltiple knee arthroscopies bilateral      REVERSE SHOULDER ARTHROPLASTY Left 08/17/2022   Procedure: REVERSE SHOULDER ARTHROPLASTY;  Surgeon: Bjorn Pippin, MD;  Location: [facility];  Service: Orthopedics;  Laterality: Left;   SHOULDER ARTHROSCOPY WITH DISTAL CLAVICLE RESECTION Left 01/26/2022   Procedure: SHOULDER ARTHROSCOPY WITH DISTAL CLAVICLE EXCISION/ DEBRIDEMENT;  Surgeon: Bjorn Pippin, MD;  Location: [facility];  Service: Orthopedics;  Laterality: Left;   SHOULDER ARTHROSCOPY WITH SUBACROMIAL DECOMPRESSION, ROTATOR CUFF REPAIR AND BICEP TENDON REPAIR Left 01/26/2022   Procedure: SHOULDER ARTHROSCOPY WITH SUBACROMIAL DECOMPRESSION, ROTATOR CUFF REPAIR;  Surgeon: Bjorn Pippin, MD;  Location: [facility];  Service: Orthopedics;  Laterality: Left;   TONSILLECTOMY     TOTAL KNEE ARTHROPLASTY Left 02/14/2023   Procedure: TOTAL KNEE ARTHROPLASTY;  Surgeon: Joen Laura, MD;  Location: [facility];   Service: Orthopedics;  Laterality: Left;   TOTAL KNEE ARTHROPLASTY Right 07/02/2023   Procedure: TOTAL KNEE ARTHROPLASTY;  Surgeon: Joen Laura, MD;  Location: [facility];  Service: Orthopedics;  Laterality: Right;   TUBAL LIGATION     Patient Active Problem List   Diagnosis Date Noted   Chronic right shoulder pain 02/08/2023   Lumbar degenerative disc disease 11/30/2021   Status post total shoulder arthroplasty, left 03/14/2021   Primary osteoarthritis of both knees 03/14/2021    PCP: April Manson, NP  REFERRING PROVIDER: Joen Laura, MD  REFERRING DIAG: POST OP RIGHT TOTAL KNEE REPLACEMENT  THERAPY DIAG:  Stiffness of right knee, not elsewhere classified  Acute pain of right knee  Localized edema  Rationale for Evaluation and Treatment: Rehabilitation  ONSET DATE: 07/02/23  SUBJECTIVE:   SUBJECTIVE STATEMENT: Patient reports that her knee is hurting a little today. However, she was told by her referring physician that her knee looked good, but she needed to work on her extension.   PERTINENT HISTORY: Osteoarthritis, anxiety, chronic kidney disease, and hypertension PAIN:  Are you having pain? Yes: NPRS scale: 5/10 Pain location: right thigh radiating down to her right ankle Pain description: constant aching and throbbing  Aggravating factors: none known Relieving factors: none   PRECAUTIONS: None  RED FLAGS: None   WEIGHT BEARING RESTRICTIONS: No  FALLS:  Has patient fallen  in last 6 months? No  LIVING ENVIRONMENT: Lives with: lives with their family Lives in: House/apartment Stairs: Yes: Internal: 12-14 steps; on right going up and External: 7 steps; can reach both; patient reports a step to pattern ascending stairs and sitting down and sliding down her stairs to go down Has following equipment at home: Walker - 2 wheeled  OCCUPATION: not working  PLOF: Independent  PATIENT GOALS: reduced pain, improved mobility, and be able to watch  her grandchildren (the oldest is 28 years old and the youngest is 57 years old)   NEXT MD VISIT: 07/17/23  OBJECTIVE:   DIAGNOSTIC FINDINGS: 07/02/23 right knee x-ray IMPRESSION: Right knee arthroplasty without immediate postoperative complication.  PATIENT SURVEYS:  LEFS 16/80  COGNITION: Overall cognitive status: Within functional limits for tasks assessed     SENSATION: Patient reports no numbness or tingling  EDEMA:  Circumferential: Right tibiofemoral joint line: 46.5 cm Left tibiofemoral joint line: 42.0 cm   PALPATION: TTP: right hamstrings insertion, gastrocnemius/soleus, and medial and lateral joint lines  LOWER EXTREMITY ROM:  Active ROM Right eval Left eval  Hip flexion    Hip extension    Hip abduction    Hip adduction    Hip internal rotation    Hip external rotation    Knee flexion 73/ 84; limited by pain  93  Knee extension 14; limited by pain  0  Ankle dorsiflexion    Ankle plantarflexion    Ankle inversion    Ankle eversion     (Blank rows = not tested)  LOWER EXTREMITY MMT: not tested due to surgical condition  LOWER EXTREMITY SPECIAL TESTS:  Not tested due to surgical condition  FUNCTIONAL TESTS:  Significant difficulty with sit to stand transfers  GAIT: Assistive device utilized: Environmental consultant - 2 wheeled Level of assistance: Modified independence Comments: decreased gait speed and stride length   TODAY'S TREATMENT:                                                                                                                              DATE:                                    07/19/23 EXERCISE LOG  Exercise Repetitions and Resistance Comments  Rocker board  5 minutes    Marching on foam  3 minutes    Standing HS stretch  4 x 30 seconds  RLE only   Nustep  L4 x 17 minutes; seat 8-5   Lunge onto step  8" step x 3 minutes    Standing HS curl  15 reps each     Blank cell = exercise not performed today  07/16/23 EXERCISE LOG  Exercise Repetitions and Resistance Comments  Nustep Lvl 2 x 20; seat 7 - 6   QS 3 mins   HS 3 mins   Ankle Pumps 3 mins   SAQ 4 mins   Heel Slides Heel Glide x 4 mins    Blank cell = exercise not performed today   Manual Therapy Soft Tissue Mobilization: right knee, STW/M to right medial and lateral knee to decrease pain and tone    PATIENT EDUCATION:  Education details: POC, benefits of HEP, healing, prognosis, and goals for therapy Person educated: Patient Education method: Explanation Education comprehension: verbalized understanding  HOME EXERCISE PROGRAM:   ASSESSMENT:  CLINICAL IMPRESSION: Patient was introduced to multiple new interventions for improved right knee mobility with moderate difficulty. She required minimal cueing with today's new interventions for proper positioning to facilitate improved soft tissue extensibility. She experienced no significant increase in pain or discomfort with any of today's interventions. She reported that her knee felt alright upon the conclusion of treatment. She continues to require skilled physical therapy to address her remaining impairments to return to her prior level of function.   OBJECTIVE IMPAIRMENTS: Abnormal gait, decreased activity tolerance, decreased mobility, difficulty walking, decreased ROM, decreased strength, hypomobility, increased edema, impaired tone, and pain.   ACTIVITY LIMITATIONS: carrying, lifting, sitting, standing, squatting, sleeping, stairs, transfers, bed mobility, and locomotion level  PARTICIPATION LIMITATIONS: meal prep, cleaning, laundry, driving, shopping, community activity, and yard work  PERSONAL FACTORS: Transportation and 3+ comorbidities: Osteoarthritis, anxiety, chronic kidney disease, and hypertension  are also affecting patient's functional outcome.   REHAB POTENTIAL: Good  CLINICAL DECISION MAKING: Evolving/moderate complexity  EVALUATION COMPLEXITY:  Moderate   GOALS: Goals reviewed with patient? Yes  SHORT TERM GOALS: Target date: 07/30/23 Patient will be independent with her initial HEP.  Baseline: Goal status: INITIAL  2.  Patient will be able to improve her right knee extension within 10 degrees of neutral for improved gait mechanics.  Baseline:  Goal status: INITIAL  3.  Patient will be able to demonstrate right knee flexion to 85 degrees or greater for improved function squatting.  Baseline:  Goal status: INITIAL  4.  Patient will improve her LEFS score to 26/80 or greater for improved perceived functional mobility.  Baseline:  Goal status: INITIAL  LONG TERM GOALS: Target date: 08/20/23  Patient will be independent with her advanced HEP.  Baseline:  Goal status: INITIAL  2.  Patient will improve her active right knee extension within 5 degrees of neutral for improved gait mechanics.  Baseline:  Goal status: INITIAL  3.  Patient will improve her right knee flexion to 105 degrees or greater for improved function navigating stairs.  Baseline:  Goal status: INITIAL  4.  Patient will be able to descend stairs with at least a step to pattern for improved household mobility.  Baseline:  Goal status: INITIAL  5.  Patient will improve her LEFS score to 36/80 or greater for improved function with her daily activities.  Baseline:  Goal status: INITIAL  PLAN:  PT FREQUENCY: 2x/week  PT DURATION: 6 weeks  PLANNED INTERVENTIONS: Therapeutic exercises, Therapeutic activity, Neuromuscular re-education, Balance training, Gait training, Patient/Family education, Self Care, Joint mobilization, Stair training, Manual therapy, and Re-evaluation  PLAN FOR NEXT SESSION: NuStep, quad set, heel slide, manual therapy, and update HEP as needed   Granville Lewis, PT 07/19/2023, 12:54 PM

## 2023-07-23 ENCOUNTER — Ambulatory Visit: Payer: Medicaid Other

## 2023-07-23 DIAGNOSIS — M25661 Stiffness of right knee, not elsewhere classified: Secondary | ICD-10-CM

## 2023-07-23 DIAGNOSIS — R600 Localized edema: Secondary | ICD-10-CM

## 2023-07-23 DIAGNOSIS — M25561 Pain in right knee: Secondary | ICD-10-CM

## 2023-07-23 NOTE — Therapy (Signed)
OUTPATIENT PHYSICAL THERAPY LOWER EXTREMITY TREATMENT   Patient Name: Patricia Carrillo MRN: [id number] DOB:1966-01-25, 57 y.o., female Today's Date: 07/23/2023  END OF SESSION:  PT End of Session - 07/23/23 0828     Visit Number 5    Number of Visits 12    Date for PT Re-Evaluation 09/07/23    PT Start Time 0800    PT Stop Time 0842    PT Time Calculation (min) 42 min    Activity Tolerance Patient tolerated treatment well    Behavior During Therapy [facility] for tasks assessed/performed               Past Medical History:  Diagnosis Date   Anxiety    Arthritis    Chronic kidney disease    "stage 3" per pt   Fatigue    Headache    History of kidney stones    Hypertension    Obesity 01/16/2022   takes metformin and ozempic "for weight loss, not diabetic"   Pre-diabetes    Sleep apnea    dx 15 years ago, never used CPAP   Past Surgical History:  Procedure Laterality Date   CHOLECYSTECTOMY     LAPAROSCOPIC HYSTERECTOMY     muiltiple knee arthroscopies bilateral      REVERSE SHOULDER ARTHROPLASTY Left 08/17/2022   Procedure: REVERSE SHOULDER ARTHROPLASTY;  Surgeon: Bjorn Pippin, MD;  Location: [facility];  Service: Orthopedics;  Laterality: Left;   SHOULDER ARTHROSCOPY WITH DISTAL CLAVICLE RESECTION Left 01/26/2022   Procedure: SHOULDER ARTHROSCOPY WITH DISTAL CLAVICLE EXCISION/ DEBRIDEMENT;  Surgeon: Bjorn Pippin, MD;  Location: [facility];  Service: Orthopedics;  Laterality: Left;   SHOULDER ARTHROSCOPY WITH SUBACROMIAL DECOMPRESSION, ROTATOR CUFF REPAIR AND BICEP TENDON REPAIR Left 01/26/2022   Procedure: SHOULDER ARTHROSCOPY WITH SUBACROMIAL DECOMPRESSION, ROTATOR CUFF REPAIR;  Surgeon: Bjorn Pippin, MD;  Location: [facility];  Service: Orthopedics;  Laterality: Left;   TONSILLECTOMY     TOTAL KNEE ARTHROPLASTY Left 02/14/2023   Procedure: TOTAL KNEE ARTHROPLASTY;  Surgeon: Joen Laura, MD;  Location: [facility];   Service: Orthopedics;  Laterality: Left;   TOTAL KNEE ARTHROPLASTY Right 07/02/2023   Procedure: TOTAL KNEE ARTHROPLASTY;  Surgeon: Joen Laura, MD;  Location: [facility];  Service: Orthopedics;  Laterality: Right;   TUBAL LIGATION     Patient Active Problem List   Diagnosis Date Noted   Chronic right shoulder pain 02/08/2023   Lumbar degenerative disc disease 11/30/2021   Status post total shoulder arthroplasty, left 03/14/2021   Primary osteoarthritis of both knees 03/14/2021    PCP: April Manson, NP  REFERRING PROVIDER: Joen Laura, MD  REFERRING DIAG: POST OP RIGHT TOTAL KNEE REPLACEMENT  THERAPY DIAG:  Stiffness of right knee, not elsewhere classified  Acute pain of right knee  Localized edema  Rationale for Evaluation and Treatment: Rehabilitation  ONSET DATE: 07/02/23  SUBJECTIVE:   SUBJECTIVE STATEMENT: Patient reports that her knee is a little sore today. She notes that she has not had any problems since her last appointment.   PERTINENT HISTORY: Osteoarthritis, anxiety, chronic kidney disease, and hypertension PAIN:  Are you having pain? Yes: NPRS scale: 2/10 Pain location: right thigh radiating down to her right ankle Pain description: constant aching and throbbing  Aggravating factors: none known Relieving factors: none   PRECAUTIONS: None  RED FLAGS: None   WEIGHT BEARING RESTRICTIONS: No  FALLS:  Has patient fallen in last 6 months? No  LIVING  ENVIRONMENT: Lives with: lives with their family Lives in: House/apartment Stairs: Yes: Internal: 12-14 steps; on right going up and External: 7 steps; can reach both; patient reports a step to pattern ascending stairs and sitting down and sliding down her stairs to go down Has following equipment at home: Walker - 2 wheeled  OCCUPATION: not working  PLOF: Independent  PATIENT GOALS: reduced pain, improved mobility, and be able to watch her grandchildren (the oldest is 68 years old and  the youngest is 57 years old)   NEXT MD VISIT: 07/17/23  OBJECTIVE:   DIAGNOSTIC FINDINGS: 07/02/23 right knee x-ray IMPRESSION: Right knee arthroplasty without immediate postoperative complication.  PATIENT SURVEYS:  LEFS 16/80  COGNITION: Overall cognitive status: Within functional limits for tasks assessed     SENSATION: Patient reports no numbness or tingling  EDEMA:  Circumferential: Right tibiofemoral joint line: 46.5 cm Left tibiofemoral joint line: 42.0 cm   PALPATION: TTP: right hamstrings insertion, gastrocnemius/soleus, and medial and lateral joint lines  LOWER EXTREMITY ROM:  Active ROM Right eval Left eval  Hip flexion    Hip extension    Hip abduction    Hip adduction    Hip internal rotation    Hip external rotation    Knee flexion 73/ 84; limited by pain  93  Knee extension 14; limited by pain  0  Ankle dorsiflexion    Ankle plantarflexion    Ankle inversion    Ankle eversion     (Blank rows = not tested)  LOWER EXTREMITY MMT: not tested due to surgical condition  LOWER EXTREMITY SPECIAL TESTS:  Not tested due to surgical condition  FUNCTIONAL TESTS:  Significant difficulty with sit to stand transfers  GAIT: Assistive device utilized: Environmental consultant - 2 wheeled Level of assistance: Modified independence Comments: decreased gait speed and stride length   TODAY'S TREATMENT:                                                                                                                              DATE:                                    07/23/23 EXERCISE LOG  Exercise Repetitions and Resistance Comments  Nustep  L4 x 16 minutes; seat 7-5   Rocker board  4.5 minutes    Mini squat  20 reps  BUE support  LAQ 20 reps  RLE only   Heel slide  3 minutes     Blank cell = exercise not performed today                                    07/19/23 EXERCISE LOG  Exercise Repetitions and Resistance Comments  Rocker board  5 minutes    Marching on foam   3 minutes  Standing HS stretch  4 x 30 seconds  RLE only   Nustep  L4 x 17 minutes; seat 8-5   Lunge onto step  8" step x 3 minutes    Standing HS curl  15 reps each     Blank cell = exercise not performed today                                     07/16/23 EXERCISE LOG  Exercise Repetitions and Resistance Comments  Nustep Lvl 2 x 20; seat 7 - 6   QS 3 mins   HS 3 mins   Ankle Pumps 3 mins   SAQ 4 mins   Heel Slides Heel Glide x 4 mins    Blank cell = exercise not performed today   Manual Therapy Soft Tissue Mobilization: right knee, STW/M to right medial and lateral knee to decrease pain and tone    PATIENT EDUCATION:  Education details: POC, benefits of HEP, healing, prognosis, and goals for therapy Person educated: Patient Education method: Explanation Education comprehension: verbalized understanding  HOME EXERCISE PROGRAM:   ASSESSMENT:  CLINICAL IMPRESSION: Patient was progressed with mini squats in addition to familiar interventions for improved right lower extremity strength and mobility needed for functional activities. She required minimal cueing with today's interventions for proper exercise performance. Her goals were reassessed and she was able to significantly improve her LEFS score since her initial evaluation. However, her right knee active range of motion continues to be significantly limited which limits her functional mobility. She reported that her knee felt alright upon the conclusion of treatment. She continues to require skilled physical therapy to address her remaining impairments to return to her prior level of function.   OBJECTIVE IMPAIRMENTS: Abnormal gait, decreased activity tolerance, decreased mobility, difficulty walking, decreased ROM, decreased strength, hypomobility, increased edema, impaired tone, and pain.   ACTIVITY LIMITATIONS: carrying, lifting, sitting, standing, squatting, sleeping, stairs, transfers, bed mobility, and locomotion  level  PARTICIPATION LIMITATIONS: meal prep, cleaning, laundry, driving, shopping, community activity, and yard work  PERSONAL FACTORS: Transportation and 3+ comorbidities: Osteoarthritis, anxiety, chronic kidney disease, and hypertension  are also affecting patient's functional outcome.   REHAB POTENTIAL: Good  CLINICAL DECISION MAKING: Evolving/moderate complexity  EVALUATION COMPLEXITY: Moderate   GOALS: Goals reviewed with patient? Yes  SHORT TERM GOALS: Target date: 07/30/23 Patient will be independent with her initial HEP.  Baseline: Goal status: MET  2.  Patient will be able to improve her right knee extension within 10 degrees of neutral for improved gait mechanics.  Baseline: 12 degrees Goal status: ON GOING  3.  Patient will be able to demonstrate right knee flexion to 85 degrees or greater for improved function squatting.  Baseline: 81 degrees Goal status: ON GOING  4.  Patient will improve her LEFS score to 26/80 or greater for improved perceived functional mobility.  Baseline: 38/80 on 07/23/23 Goal status: MET  LONG TERM GOALS: Target date: 08/20/23  Patient will be independent with her advanced HEP.  Baseline:  Goal status: ON GOING  2.  Patient will improve her active right knee extension within 5 degrees of neutral for improved gait mechanics.  Baseline:  Goal status: ON GOING  3.  Patient will improve her right knee flexion to 105 degrees or greater for improved function navigating stairs.  Baseline:  Goal status: ON GOING  4.  Patient will be able to descend  stairs with at least a step to pattern for improved household mobility.  Baseline:  Goal status: ON GOING  5.  Patient will improve her LEFS score to 36/80 or greater for improved function with her daily activities.  Baseline: 38/80 on 07/23/23 Goal status: MET  PLAN:  PT FREQUENCY: 2x/week  PT DURATION: 6 weeks  PLANNED INTERVENTIONS: Therapeutic exercises, Therapeutic activity,  Neuromuscular re-education, Balance training, Gait training, Patient/Family education, Self Care, Joint mobilization, Stair training, Manual therapy, and Re-evaluation  PLAN FOR NEXT SESSION: NuStep, quad set, heel slide, manual therapy, and update HEP as needed   Granville Lewis, PT 07/23/2023, 12:41 PM

## 2023-07-24 ENCOUNTER — Other Ambulatory Visit: Payer: Self-pay | Admitting: Sports Medicine

## 2023-07-24 DIAGNOSIS — G8929 Other chronic pain: Secondary | ICD-10-CM

## 2023-07-26 ENCOUNTER — Ambulatory Visit: Payer: Medicaid Other | Admitting: *Deleted

## 2023-07-26 ENCOUNTER — Encounter: Payer: Self-pay | Admitting: *Deleted

## 2023-07-26 DIAGNOSIS — M25661 Stiffness of right knee, not elsewhere classified: Secondary | ICD-10-CM | POA: Diagnosis not present

## 2023-07-26 DIAGNOSIS — R600 Localized edema: Secondary | ICD-10-CM

## 2023-07-26 DIAGNOSIS — M25561 Pain in right knee: Secondary | ICD-10-CM

## 2023-07-26 NOTE — Therapy (Signed)
OUTPATIENT PHYSICAL THERAPY LOWER EXTREMITY TREATMENT   Patient Name: Andreana Sigur MRN: [id number] DOB:1966/10/03, 57 y.o., female Today's Date: 07/26/2023  END OF SESSION:  PT End of Session - 07/26/23 0755     Visit Number 6    Number of Visits 12    Date for PT Re-Evaluation 09/07/23    PT Start Time 0800    PT Stop Time 0852    PT Time Calculation (min) 52 min               Past Medical History:  Diagnosis Date   Anxiety    Arthritis    Chronic kidney disease    "stage 3" per pt   Fatigue    Headache    History of kidney stones    Hypertension    Obesity 01/16/2022   takes metformin and ozempic "for weight loss, not diabetic"   Pre-diabetes    Sleep apnea    dx 15 years ago, never used CPAP   Past Surgical History:  Procedure Laterality Date   CHOLECYSTECTOMY     LAPAROSCOPIC HYSTERECTOMY     muiltiple knee arthroscopies bilateral      REVERSE SHOULDER ARTHROPLASTY Left 08/17/2022   Procedure: REVERSE SHOULDER ARTHROPLASTY;  Surgeon: Bjorn Pippin, MD;  Location: [facility];  Service: Orthopedics;  Laterality: Left;   SHOULDER ARTHROSCOPY WITH DISTAL CLAVICLE RESECTION Left 01/26/2022   Procedure: SHOULDER ARTHROSCOPY WITH DISTAL CLAVICLE EXCISION/ DEBRIDEMENT;  Surgeon: Bjorn Pippin, MD;  Location: [facility];  Service: Orthopedics;  Laterality: Left;   SHOULDER ARTHROSCOPY WITH SUBACROMIAL DECOMPRESSION, ROTATOR CUFF REPAIR AND BICEP TENDON REPAIR Left 01/26/2022   Procedure: SHOULDER ARTHROSCOPY WITH SUBACROMIAL DECOMPRESSION, ROTATOR CUFF REPAIR;  Surgeon: Bjorn Pippin, MD;  Location: [facility];  Service: Orthopedics;  Laterality: Left;   TONSILLECTOMY     TOTAL KNEE ARTHROPLASTY Left 02/14/2023   Procedure: TOTAL KNEE ARTHROPLASTY;  Surgeon: Joen Laura, MD;  Location: [facility];  Service: Orthopedics;  Laterality: Left;   TOTAL KNEE ARTHROPLASTY Right 07/02/2023   Procedure: TOTAL KNEE  ARTHROPLASTY;  Surgeon: Joen Laura, MD;  Location: [facility];  Service: Orthopedics;  Laterality: Right;   TUBAL LIGATION     Patient Active Problem List   Diagnosis Date Noted   Chronic right shoulder pain 02/08/2023   Lumbar degenerative disc disease 11/30/2021   Status post total shoulder arthroplasty, left 03/14/2021   Primary osteoarthritis of both knees 03/14/2021    PCP: April Manson, NP  REFERRING PROVIDER: Joen Laura, MD  REFERRING DIAG: POST OP RIGHT TOTAL KNEE REPLACEMENT  THERAPY DIAG:  Stiffness of right knee, not elsewhere classified  Acute pain of right knee  Localized edema  Rationale for Evaluation and Treatment: Rehabilitation  ONSET DATE: 07/02/23  SUBJECTIVE:   SUBJECTIVE STATEMENT: Patient reports that her RT knee is a  hurting today 9/10. She notes that she has not had any problems since her last appointment.   PERTINENT HISTORY: Osteoarthritis, anxiety, chronic kidney disease, and hypertension PAIN:  Are you having pain? Yes: NPRS scale: 2/10 Pain location: right thigh radiating down to her right ankle Pain description: constant aching and throbbing  Aggravating factors: none known Relieving factors: none   PRECAUTIONS: None  RED FLAGS: None   WEIGHT BEARING RESTRICTIONS: No  FALLS:  Has patient fallen in last 6 months? No  LIVING ENVIRONMENT: Lives with: lives with their family Lives in: House/apartment Stairs: Yes: Internal: 12-14 steps; on right  going up and External: 7 steps; can reach both; patient reports a step to pattern ascending stairs and sitting down and sliding down her stairs to go down Has following equipment at home: Walker - 2 wheeled  OCCUPATION: not working  PLOF: Independent  PATIENT GOALS: reduced pain, improved mobility, and be able to watch her grandchildren (the oldest is 39 years old and the youngest is 57 years old)   NEXT MD VISIT: 07/17/23  OBJECTIVE:   DIAGNOSTIC FINDINGS: 07/02/23  right knee x-ray IMPRESSION: Right knee arthroplasty without immediate postoperative complication.  PATIENT SURVEYS:  LEFS 16/80  COGNITION: Overall cognitive status: Within functional limits for tasks assessed     SENSATION: Patient reports no numbness or tingling  EDEMA:  Circumferential: Right tibiofemoral joint line: 46.5 cm Left tibiofemoral joint line: 42.0 cm   PALPATION: TTP: right hamstrings insertion, gastrocnemius/soleus, and medial and lateral joint lines  LOWER EXTREMITY ROM:  Active ROM Right eval Left eval  Hip flexion    Hip extension    Hip abduction    Hip adduction    Hip internal rotation    Hip external rotation    Knee flexion 73/ 84; limited by pain  93  Knee extension 14; limited by pain  0  Ankle dorsiflexion    Ankle plantarflexion    Ankle inversion    Ankle eversion     (Blank rows = not tested)  LOWER EXTREMITY MMT: not tested due to surgical condition  LOWER EXTREMITY SPECIAL TESTS:  Not tested due to surgical condition  FUNCTIONAL TESTS:  Significant difficulty with sit to stand transfers  GAIT: Assistive device utilized: Environmental consultant - 2 wheeled Level of assistance: Modified independence Comments: decreased gait speed and stride length   TODAY'S TREATMENT:                                                                                                                              DATE:                                    07/26/23 EXERCISE LOG    RT knee  Exercise Repetitions and Resistance Comments  Nustep  L4 x 16 minutes; seat 7-5   Rocker board  4.5 minutes    6in step up 2x10 with UE assist   Mini squat   BUE support  LAQ 20 reps 2# RLE only   Heel slide  3 minutes     Blank cell = exercise not performed today  Manual PROM for flexion and extension RT knee                                     07/19/23 EXERCISE LOG  Exercise Repetitions and Resistance Comments  Rocker board  5 minutes    Marching on  foam  3 minutes     Standing HS stretch  4 x 30 seconds  RLE only   Nustep  L4 x 17 minutes; seat 8-5   Lunge onto step  8" step x 3 minutes    Standing HS curl  15 reps each     Blank cell = exercise not performed today                                     07/16/23 EXERCISE LOG  Exercise Repetitions and Resistance Comments  Nustep Lvl 2 x 20; seat 7 - 6   QS 3 mins   HS 3 mins   Ankle Pumps 3 mins   SAQ 4 mins   Heel Slides Heel Glide x 4 mins    Blank cell = exercise not performed today   Manual Therapy Soft Tissue Mobilization: right knee, STW/M to right medial and lateral knee to decrease pain and tone    PATIENT EDUCATION:  Education details: POC, benefits of HEP, healing, prognosis, and goals for therapy Person educated: Patient Education method: Explanation Education comprehension: verbalized understanding  HOME EXERCISE PROGRAM:   ASSESSMENT:  CLINICAL IMPRESSION: Patient  arrived today with some increased pain RT knee , but was able to continue with all exs to improve ROM and strengthening. PROM RT knee flexion 93 degrees and extension -5 degrees today.    OBJECTIVE IMPAIRMENTS: Abnormal gait, decreased activity tolerance, decreased mobility, difficulty walking, decreased ROM, decreased strength, hypomobility, increased edema, impaired tone, and pain.   ACTIVITY LIMITATIONS: carrying, lifting, sitting, standing, squatting, sleeping, stairs, transfers, bed mobility, and locomotion level  PARTICIPATION LIMITATIONS: meal prep, cleaning, laundry, driving, shopping, community activity, and yard work  PERSONAL FACTORS: Transportation and 3+ comorbidities: Osteoarthritis, anxiety, chronic kidney disease, and hypertension  are also affecting patient's functional outcome.   REHAB POTENTIAL: Good  CLINICAL DECISION MAKING: Evolving/moderate complexity  EVALUATION COMPLEXITY: Moderate   GOALS: Goals reviewed with patient? Yes  SHORT TERM GOALS: Target date: 07/30/23 Patient will be  independent with her initial HEP.  Baseline: Goal status: MET  2.  Patient will be able to improve her right knee extension within 10 degrees of neutral for improved gait mechanics.  Baseline:   Goal status: MET  3.  Patient will be able to demonstrate right knee flexion to 85 degrees or greater for improved function squatting.  Baseline: 81 degrees Goal status: MET  4.  Patient will improve her LEFS score to 26/80 or greater for improved perceived functional mobility.  Baseline: 38/80 on 07/23/23 Goal status: MET  LONG TERM GOALS: Target date: 08/20/23  Patient will be independent with her advanced HEP.  Baseline:  Goal status: ON GOING  2.  Patient will improve her active right knee extension within 5 degrees of neutral for improved gait mechanics.  Baseline:  Goal status: ON GOING  3.  Patient will improve her right knee flexion to 105 degrees or greater for improved function navigating stairs.  Baseline:  Goal status: ON GOING  4.  Patient will be able to descend stairs with at least a step to pattern for improved household mobility.  Baseline:  Goal status: ON GOING  5.  Patient will improve her LEFS score to 36/80 or greater for improved function with her daily activities.  Baseline: 38/80 on 07/23/23 Goal status: MET  PLAN:  PT FREQUENCY: 2x/week  PT DURATION: 6 weeks  PLANNED  INTERVENTIONS: Therapeutic exercises, Therapeutic activity, Neuromuscular re-education, Balance training, Gait training, Patient/Family education, Self Care, Joint mobilization, Stair training, Manual therapy, and Re-evaluation  PLAN FOR NEXT SESSION: NuStep, quad set, heel slide, manual therapy, and update HEP as needed   Bevan Disney,CHRIS, PTA 07/26/2023, 8:58 AM

## 2023-07-31 ENCOUNTER — Ambulatory Visit: Payer: Medicaid Other

## 2023-07-31 DIAGNOSIS — R600 Localized edema: Secondary | ICD-10-CM

## 2023-07-31 DIAGNOSIS — M25561 Pain in right knee: Secondary | ICD-10-CM

## 2023-07-31 DIAGNOSIS — M25661 Stiffness of right knee, not elsewhere classified: Secondary | ICD-10-CM

## 2023-07-31 NOTE — Therapy (Signed)
OUTPATIENT PHYSICAL THERAPY LOWER EXTREMITY TREATMENT   Patient Name: Patricia Carrillo MRN: [id number] DOB:June 21, 1966, 57 y.o., female Today's Date: 07/31/2023  END OF SESSION:  PT End of Session - 07/31/23 0804     Visit Number 7    Number of Visits 12    Date for PT Re-Evaluation 09/07/23    PT Start Time 0800    PT Stop Time 0849    PT Time Calculation (min) 49 min               Past Medical History:  Diagnosis Date   Anxiety    Arthritis    Chronic kidney disease    "stage 3" per pt   Fatigue    Headache    History of kidney stones    Hypertension    Obesity 01/16/2022   takes metformin and ozempic "for weight loss, not diabetic"   Pre-diabetes    Sleep apnea    dx 15 years ago, never used CPAP   Past Surgical History:  Procedure Laterality Date   CHOLECYSTECTOMY     LAPAROSCOPIC HYSTERECTOMY     muiltiple knee arthroscopies bilateral      REVERSE SHOULDER ARTHROPLASTY Left 08/17/2022   Procedure: REVERSE SHOULDER ARTHROPLASTY;  Surgeon: Bjorn Pippin, MD;  Location: [facility];  Service: Orthopedics;  Laterality: Left;   SHOULDER ARTHROSCOPY WITH DISTAL CLAVICLE RESECTION Left 01/26/2022   Procedure: SHOULDER ARTHROSCOPY WITH DISTAL CLAVICLE EXCISION/ DEBRIDEMENT;  Surgeon: Bjorn Pippin, MD;  Location: [facility];  Service: Orthopedics;  Laterality: Left;   SHOULDER ARTHROSCOPY WITH SUBACROMIAL DECOMPRESSION, ROTATOR CUFF REPAIR AND BICEP TENDON REPAIR Left 01/26/2022   Procedure: SHOULDER ARTHROSCOPY WITH SUBACROMIAL DECOMPRESSION, ROTATOR CUFF REPAIR;  Surgeon: Bjorn Pippin, MD;  Location: [facility];  Service: Orthopedics;  Laterality: Left;   TONSILLECTOMY     TOTAL KNEE ARTHROPLASTY Left 02/14/2023   Procedure: TOTAL KNEE ARTHROPLASTY;  Surgeon: Joen Laura, MD;  Location: [facility];  Service: Orthopedics;  Laterality: Left;   TOTAL KNEE ARTHROPLASTY Right 07/02/2023   Procedure: TOTAL KNEE  ARTHROPLASTY;  Surgeon: Joen Laura, MD;  Location: [facility];  Service: Orthopedics;  Laterality: Right;   TUBAL LIGATION     Patient Active Problem List   Diagnosis Date Noted   Chronic right shoulder pain 02/08/2023   Lumbar degenerative disc disease 11/30/2021   Status post total shoulder arthroplasty, left 03/14/2021   Primary osteoarthritis of both knees 03/14/2021    PCP: April Manson, NP  REFERRING PROVIDER: Joen Laura, MD  REFERRING DIAG: POST OP RIGHT TOTAL KNEE REPLACEMENT  THERAPY DIAG:  Stiffness of right knee, not elsewhere classified  Acute pain of right knee  Localized edema  Rationale for Evaluation and Treatment: Rehabilitation  ONSET DATE: 07/02/23  SUBJECTIVE:   SUBJECTIVE STATEMENT: Pt reports 8/10 right knee pain today.  Pt reports constant distal quad pain.   PERTINENT HISTORY: Osteoarthritis, anxiety, chronic kidney disease, and hypertension PAIN:  Are you having pain? Yes: NPRS scale: 8/10 Pain location: right thigh radiating down to her right ankle Pain description: constant aching and throbbing  Aggravating factors: none known Relieving factors: none   PRECAUTIONS: None  RED FLAGS: None   WEIGHT BEARING RESTRICTIONS: No  FALLS:  Has patient fallen in last 6 months? No  LIVING ENVIRONMENT: Lives with: lives with their family Lives in: House/apartment Stairs: Yes: Internal: 12-14 steps; on right going up and External: 7 steps; can reach both; patient reports  a step to pattern ascending stairs and sitting down and sliding down her stairs to go down Has following equipment at home: Walker - 2 wheeled  OCCUPATION: not working  PLOF: Independent  PATIENT GOALS: reduced pain, improved mobility, and be able to watch her grandchildren (the oldest is 41 years old and the youngest is 57 years old)   NEXT MD VISIT: 07/17/23  OBJECTIVE:   DIAGNOSTIC FINDINGS: 07/02/23 right knee x-ray IMPRESSION: Right knee  arthroplasty without immediate postoperative complication.  PATIENT SURVEYS:  LEFS 16/80  COGNITION: Overall cognitive status: Within functional limits for tasks assessed     SENSATION: Patient reports no numbness or tingling  EDEMA:  Circumferential: Right tibiofemoral joint line: 46.5 cm Left tibiofemoral joint line: 42.0 cm   PALPATION: TTP: right hamstrings insertion, gastrocnemius/soleus, and medial and lateral joint lines  LOWER EXTREMITY ROM:  Active ROM Right eval Left eval  Hip flexion    Hip extension    Hip abduction    Hip adduction    Hip internal rotation    Hip external rotation    Knee flexion 73/ 84; limited by pain  93  Knee extension 14; limited by pain  0  Ankle dorsiflexion    Ankle plantarflexion    Ankle inversion    Ankle eversion     (Blank rows = not tested)  LOWER EXTREMITY MMT: not tested due to surgical condition  LOWER EXTREMITY SPECIAL TESTS:  Not tested due to surgical condition  FUNCTIONAL TESTS:  Significant difficulty with sit to stand transfers  GAIT: Assistive device utilized: Environmental consultant - 2 wheeled Level of assistance: Modified independence Comments: decreased gait speed and stride length   TODAY'S TREATMENT:                                                                                                                              DATE:                                    07/31/23 EXERCISE LOG    RT knee  Exercise Repetitions and Resistance Comments  Nustep  L4 x 16 minutes; seat 7-5   Rocker board  5 minutes    Lunge 12" box x 3 mins   6in step up 2x10 with UE assist   Mini squat   BUE support  LAQ 20 reps 2# RLE only   Heel slide  3 minutes     Blank cell = exercise not performed today   Manual Therapy Soft Tissue Mobilization: distal quad, STW/M to right distal quad to decrease pain and tone with pt positioned in supine for comfort                                      07/19/23 EXERCISE LOG  Exercise  Repetitions and Resistance Comments  Rocker board  5 minutes    Marching on foam  3 minutes    Standing HS stretch  4 x 30 seconds  RLE only   Nustep  L4 x 17 minutes; seat 8-5   Lunge onto step  8" step x 3 minutes    Standing HS curl  15 reps each     Blank cell = exercise not performed today                                     07/16/23 EXERCISE LOG  Exercise Repetitions and Resistance Comments  Nustep Lvl 2 x 20; seat 7 - 6   QS 3 mins   HS 3 mins   Ankle Pumps 3 mins   SAQ 4 mins   Heel Slides Heel Glide x 4 mins    Blank cell = exercise not performed today   Manual Therapy Soft Tissue Mobilization: right knee, STW/M to right medial and lateral knee to decrease pain and tone    PATIENT EDUCATION:  Education details: POC, benefits of HEP, healing, prognosis, and goals for therapy Person educated: Patient Education method: Explanation Education comprehension: verbalized understanding  HOME EXERCISE PROGRAM:   ASSESSMENT:  CLINICAL IMPRESSION: Pt arrives for today's treatment session reporting 8/10 right knee pain.  Pt reports constant right distal quad pain with intermittent medial knee pain that is shooting, but short in duration.  Pt able to tolerate increased time with all exercises performed today.  STW/M performed to distal right quad to decrease pain and tone with good results.  Pt reported 4/10 right knee pain at completion of today's treatment session.    OBJECTIVE IMPAIRMENTS: Abnormal gait, decreased activity tolerance, decreased mobility, difficulty walking, decreased ROM, decreased strength, hypomobility, increased edema, impaired tone, and pain.   ACTIVITY LIMITATIONS: carrying, lifting, sitting, standing, squatting, sleeping, stairs, transfers, bed mobility, and locomotion level  PARTICIPATION LIMITATIONS: meal prep, cleaning, laundry, driving, shopping, community activity, and yard work  PERSONAL FACTORS: Transportation and 3+ comorbidities: Osteoarthritis,  anxiety, chronic kidney disease, and hypertension  are also affecting patient's functional outcome.   REHAB POTENTIAL: Good  CLINICAL DECISION MAKING: Evolving/moderate complexity  EVALUATION COMPLEXITY: Moderate   GOALS: Goals reviewed with patient? Yes  SHORT TERM GOALS: Target date: 07/30/23 Patient will be independent with her initial HEP.  Baseline: Goal status: MET  2.  Patient will be able to improve her right knee extension within 10 degrees of neutral for improved gait mechanics.  Baseline:   Goal status: MET  3.  Patient will be able to demonstrate right knee flexion to 85 degrees or greater for improved function squatting.  Baseline: 81 degrees Goal status: MET  4.  Patient will improve her LEFS score to 26/80 or greater for improved perceived functional mobility.  Baseline: 38/80 on 07/23/23 Goal status: MET  LONG TERM GOALS: Target date: 08/20/23  Patient will be independent with her advanced HEP.  Baseline:  Goal status: ON GOING  2.  Patient will improve her active right knee extension within 5 degrees of neutral for improved gait mechanics.  Baseline:  Goal status: ON GOING  3.  Patient will improve her right knee flexion to 105 degrees or greater for improved function navigating stairs.  Baseline:  Goal status: ON GOING  4.  Patient will be able to descend stairs with at least a step to pattern for improved  household mobility.  Baseline:  Goal status: ON GOING  5.  Patient will improve her LEFS score to 36/80 or greater for improved function with her daily activities.  Baseline: 38/80 on 07/23/23 Goal status: MET  PLAN:  PT FREQUENCY: 2x/week  PT DURATION: 6 weeks  PLANNED INTERVENTIONS: Therapeutic exercises, Therapeutic activity, Neuromuscular re-education, Balance training, Gait training, Patient/Family education, Self Care, Joint mobilization, Stair training, Manual therapy, and Re-evaluation  PLAN FOR NEXT SESSION: NuStep, quad set,  heel slide, manual therapy, and update HEP as needed   Newman Pies, PTA 07/31/2023, 8:56 AM

## 2023-08-03 ENCOUNTER — Ambulatory Visit: Payer: Medicaid Other

## 2023-08-03 DIAGNOSIS — M25561 Pain in right knee: Secondary | ICD-10-CM

## 2023-08-03 DIAGNOSIS — M25661 Stiffness of right knee, not elsewhere classified: Secondary | ICD-10-CM | POA: Diagnosis not present

## 2023-08-03 DIAGNOSIS — R600 Localized edema: Secondary | ICD-10-CM

## 2023-08-03 NOTE — Therapy (Signed)
OUTPATIENT PHYSICAL THERAPY LOWER EXTREMITY TREATMENT   Patient Name: Patricia Carrillo MRN: [id number] DOB:[birth year], 57 y.o., female Today's Date: 08/03/2023  END OF SESSION:  PT End of Session - 08/03/23 0803     Visit Number 8    Number of Visits 12    Date for PT Re-Evaluation 09/07/23    PT Start Time 0800    PT Stop Time 0845    PT Time Calculation (min) 45 min               Past Medical History:  Diagnosis Date   Anxiety    Arthritis    Chronic kidney disease    "stage 3" per pt   Fatigue    Headache    History of kidney stones    Hypertension    Obesity 01/16/2022   takes metformin and ozempic "for weight loss, not diabetic"   Pre-diabetes    Sleep apnea    dx 15 years ago, never used CPAP   Past Surgical History:  Procedure Laterality Date   CHOLECYSTECTOMY     LAPAROSCOPIC HYSTERECTOMY     muiltiple knee arthroscopies bilateral      REVERSE SHOULDER ARTHROPLASTY Left 08/17/2022   Procedure: REVERSE SHOULDER ARTHROPLASTY;  Surgeon: Bjorn Pippin, MD;  Location: [facility];  Service: Orthopedics;  Laterality: Left;   SHOULDER ARTHROSCOPY WITH DISTAL CLAVICLE RESECTION Left 01/26/2022   Procedure: SHOULDER ARTHROSCOPY WITH DISTAL CLAVICLE EXCISION/ DEBRIDEMENT;  Surgeon: Bjorn Pippin, MD;  Location: [facility];  Service: Orthopedics;  Laterality: Left;   SHOULDER ARTHROSCOPY WITH SUBACROMIAL DECOMPRESSION, ROTATOR CUFF REPAIR AND BICEP TENDON REPAIR Left 01/26/2022   Procedure: SHOULDER ARTHROSCOPY WITH SUBACROMIAL DECOMPRESSION, ROTATOR CUFF REPAIR;  Surgeon: Bjorn Pippin, MD;  Location: [facility];  Service: Orthopedics;  Laterality: Left;   TONSILLECTOMY     TOTAL KNEE ARTHROPLASTY Left 02/14/2023   Procedure: TOTAL KNEE ARTHROPLASTY;  Surgeon: Joen Laura, MD;  Location: [facility];  Service: Orthopedics;  Laterality: Left;   TOTAL KNEE ARTHROPLASTY Right 07/02/2023   Procedure: TOTAL KNEE  ARTHROPLASTY;  Surgeon: Joen Laura, MD;  Location: [facility];  Service: Orthopedics;  Laterality: Right;   TUBAL LIGATION     Patient Active Problem List   Diagnosis Date Noted   Chronic right shoulder pain 02/08/2023   Lumbar degenerative disc disease 11/30/2021   Status post total shoulder arthroplasty, left 03/14/2021   Primary osteoarthritis of both knees 03/14/2021    PCP: April Manson, NP  REFERRING PROVIDER: Joen Laura, MD  REFERRING DIAG: POST OP RIGHT TOTAL KNEE REPLACEMENT  THERAPY DIAG:  Stiffness of right knee, not elsewhere classified  Acute pain of right knee  Localized edema  Rationale for Evaluation and Treatment: Rehabilitation  ONSET DATE: 07/02/23  SUBJECTIVE:   SUBJECTIVE STATEMENT: Pt reports 5/10 right knee pain today.   PERTINENT HISTORY: Osteoarthritis, anxiety, chronic kidney disease, and hypertension PAIN:  Are you having pain? Yes: NPRS scale: 5/10 Pain location: right thigh radiating down to her right ankle Pain description: constant aching and throbbing  Aggravating factors: none known Relieving factors: none   PRECAUTIONS: None  RED FLAGS: None   WEIGHT BEARING RESTRICTIONS: No  FALLS:  Has patient fallen in last 6 months? No  LIVING ENVIRONMENT: Lives with: lives with their family Lives in: House/apartment Stairs: Yes: Internal: 12-14 steps; on right going up and External: 7 steps; can reach both; patient reports a step to pattern ascending stairs and  sitting down and sliding down her stairs to go down Has following equipment at home: Walker - 2 wheeled  OCCUPATION: not working  PLOF: Independent  PATIENT GOALS: reduced pain, improved mobility, and be able to watch her grandchildren (the oldest is 29 years old and the youngest is 57 years old)   NEXT MD VISIT: 07/17/23  OBJECTIVE:   DIAGNOSTIC FINDINGS: 07/02/23 right knee x-ray IMPRESSION: Right knee arthroplasty without immediate  postoperative complication.  PATIENT SURVEYS:  LEFS 16/80  COGNITION: Overall cognitive status: Within functional limits for tasks assessed     SENSATION: Patient reports no numbness or tingling  EDEMA:  Circumferential: Right tibiofemoral joint line: 46.5 cm Left tibiofemoral joint line: 42.0 cm   PALPATION: TTP: right hamstrings insertion, gastrocnemius/soleus, and medial and lateral joint lines  LOWER EXTREMITY ROM:  Active ROM Right eval Left eval  Hip flexion    Hip extension    Hip abduction    Hip adduction    Hip internal rotation    Hip external rotation    Knee flexion 73/ 84; limited by pain  93  Knee extension 14; limited by pain  0  Ankle dorsiflexion    Ankle plantarflexion    Ankle inversion    Ankle eversion     (Blank rows = not tested)  LOWER EXTREMITY MMT: not tested due to surgical condition  LOWER EXTREMITY SPECIAL TESTS:  Not tested due to surgical condition  FUNCTIONAL TESTS:  Significant difficulty with sit to stand transfers  GAIT: Assistive device utilized: Environmental consultant - 2 wheeled Level of assistance: Modified independence Comments: decreased gait speed and stride length   TODAY'S TREATMENT:                                                                                                                              DATE:                                    08/03/23 EXERCISE LOG    RT knee  Exercise Repetitions and Resistance Comments  Nustep  L4 x 15 minutes; seat 7-5   Rocker board  5 minutes    Lunge 12" box x 3 mins   Seated Hip Abduction Red x 3 mins   Seated Marches 2# x 25 reps BUE support  LAQ 2# x 25 reps RLE only   Heel slide  3 minutes     Blank cell = exercise not performed today                                    07/19/23 EXERCISE LOG  Exercise Repetitions and Resistance Comments  Rocker board  5 minutes    Marching on foam  3 minutes    Standing HS stretch  4 x 30 seconds  RLE  only   Nustep  L4 x 17 minutes; seat  8-5   Lunge onto step  8" step x 3 minutes    Standing HS curl  15 reps each     Blank cell = exercise not performed today                                      PATIENT EDUCATION:  Education details: POC, benefits of HEP, healing, prognosis, and goals for therapy Person educated: Patient Education method: Explanation Education comprehension: verbalized understanding  HOME EXERCISE PROGRAM:   ASSESSMENT:  CLINICAL IMPRESSION: Pt arrives for today's treatment session reporting 5/10 right knee pain.  Pt able to tolerate increased reps with seated exercises today with minimal discomfort and fatigue.  Pt able to tolerate increased height in step for performance of lunges today.  Pt encouraged to continue performing exercises at home to assist with increasing ROM and function.  Pt reported 3/10 right knee pain at completion of today's treatment session.    OBJECTIVE IMPAIRMENTS: Abnormal gait, decreased activity tolerance, decreased mobility, difficulty walking, decreased ROM, decreased strength, hypomobility, increased edema, impaired tone, and pain.   ACTIVITY LIMITATIONS: carrying, lifting, sitting, standing, squatting, sleeping, stairs, transfers, bed mobility, and locomotion level  PARTICIPATION LIMITATIONS: meal prep, cleaning, laundry, driving, shopping, community activity, and yard work  PERSONAL FACTORS: Transportation and 3+ comorbidities: Osteoarthritis, anxiety, chronic kidney disease, and hypertension  are also affecting patient's functional outcome.   REHAB POTENTIAL: Good  CLINICAL DECISION MAKING: Evolving/moderate complexity  EVALUATION COMPLEXITY: Moderate   GOALS: Goals reviewed with patient? Yes  SHORT TERM GOALS: Target date: 07/30/23 Patient will be independent with her initial HEP.  Baseline: Goal status: MET  2.  Patient will be able to improve her right knee extension within 10 degrees of neutral for improved gait mechanics.  Baseline:   Goal status:  MET  3.  Patient will be able to demonstrate right knee flexion to 85 degrees or greater for improved function squatting.  Baseline: 81 degrees Goal status: MET  4.  Patient will improve her LEFS score to 26/80 or greater for improved perceived functional mobility.  Baseline: 38/80 on 07/23/23 Goal status: MET  LONG TERM GOALS: Target date: 08/20/23  Patient will be independent with her advanced HEP.  Baseline:  Goal status: ON GOING  2.  Patient will improve her active right knee extension within 5 degrees of neutral for improved gait mechanics.  Baseline:  Goal status: ON GOING  3.  Patient will improve her right knee flexion to 105 degrees or greater for improved function navigating stairs.  Baseline:  Goal status: ON GOING  4.  Patient will be able to descend stairs with at least a step to pattern for improved household mobility.  Baseline:  Goal status: ON GOING  5.  Patient will improve her LEFS score to 36/80 or greater for improved function with her daily activities.  Baseline: 38/80 on 07/23/23 Goal status: MET  PLAN:  PT FREQUENCY: 2x/week  PT DURATION: 6 weeks  PLANNED INTERVENTIONS: Therapeutic exercises, Therapeutic activity, Neuromuscular re-education, Balance training, Gait training, Patient/Family education, Self Care, Joint mobilization, Stair training, Manual therapy, and Re-evaluation  PLAN FOR NEXT SESSION: NuStep, quad set, heel slide, manual therapy, and update HEP as needed   Newman Pies, PTA 08/03/2023, 9:22 AM

## 2023-08-07 ENCOUNTER — Ambulatory Visit: Payer: Medicaid Other

## 2023-08-07 DIAGNOSIS — R600 Localized edema: Secondary | ICD-10-CM

## 2023-08-07 DIAGNOSIS — M25661 Stiffness of right knee, not elsewhere classified: Secondary | ICD-10-CM | POA: Diagnosis not present

## 2023-08-07 DIAGNOSIS — M25561 Pain in right knee: Secondary | ICD-10-CM

## 2023-08-07 NOTE — Therapy (Signed)
OUTPATIENT PHYSICAL THERAPY LOWER EXTREMITY TREATMENT   Patient Name: Patricia Carrillo MRN: [id number] DOB:1966-09-21, 57 y.o., female Today's Date: 08/07/2023  END OF SESSION:  PT End of Session - 08/07/23 0806     Visit Number 9    Number of Visits 12    Date for PT Re-Evaluation 09/07/23    PT Start Time 0800    PT Stop Time 0845    PT Time Calculation (min) 45 min    Activity Tolerance Patient tolerated treatment well    Behavior During Therapy [facility] for tasks assessed/performed               Past Medical History:  Diagnosis Date   Anxiety    Arthritis    Chronic kidney disease    "stage 3" per pt   Fatigue    Headache    History of kidney stones    Hypertension    Obesity 01/16/2022   takes metformin and ozempic "for weight loss, not diabetic"   Pre-diabetes    Sleep apnea    dx 15 years ago, never used CPAP   Past Surgical History:  Procedure Laterality Date   CHOLECYSTECTOMY     LAPAROSCOPIC HYSTERECTOMY     muiltiple knee arthroscopies bilateral      REVERSE SHOULDER ARTHROPLASTY Left 08/17/2022   Procedure: REVERSE SHOULDER ARTHROPLASTY;  Surgeon: Bjorn Pippin, MD;  Location: [facility];  Service: Orthopedics;  Laterality: Left;   SHOULDER ARTHROSCOPY WITH DISTAL CLAVICLE RESECTION Left 01/26/2022   Procedure: SHOULDER ARTHROSCOPY WITH DISTAL CLAVICLE EXCISION/ DEBRIDEMENT;  Surgeon: Bjorn Pippin, MD;  Location: [facility];  Service: Orthopedics;  Laterality: Left;   SHOULDER ARTHROSCOPY WITH SUBACROMIAL DECOMPRESSION, ROTATOR CUFF REPAIR AND BICEP TENDON REPAIR Left 01/26/2022   Procedure: SHOULDER ARTHROSCOPY WITH SUBACROMIAL DECOMPRESSION, ROTATOR CUFF REPAIR;  Surgeon: Bjorn Pippin, MD;  Location: [facility];  Service: Orthopedics;  Laterality: Left;   TONSILLECTOMY     TOTAL KNEE ARTHROPLASTY Left 02/14/2023   Procedure: TOTAL KNEE ARTHROPLASTY;  Surgeon: Joen Laura, MD;  Location: [facility];   Service: Orthopedics;  Laterality: Left;   TOTAL KNEE ARTHROPLASTY Right 07/02/2023   Procedure: TOTAL KNEE ARTHROPLASTY;  Surgeon: Joen Laura, MD;  Location: [facility];  Service: Orthopedics;  Laterality: Right;   TUBAL LIGATION     Patient Active Problem List   Diagnosis Date Noted   Chronic right shoulder pain 02/08/2023   Lumbar degenerative disc disease 11/30/2021   Status post total shoulder arthroplasty, left 03/14/2021   Primary osteoarthritis of both knees 03/14/2021    PCP: April Manson, NP  REFERRING PROVIDER: Joen Laura, MD  REFERRING DIAG: POST OP RIGHT TOTAL KNEE REPLACEMENT  THERAPY DIAG:  Stiffness of right knee, not elsewhere classified  Acute pain of right knee  Localized edema  Rationale for Evaluation and Treatment: Rehabilitation  ONSET DATE: 07/02/23  SUBJECTIVE:   SUBJECTIVE STATEMENT: Pt reports 2/10 right knee pain today.   PERTINENT HISTORY: Osteoarthritis, anxiety, chronic kidney disease, and hypertension PAIN:  Are you having pain? Yes: NPRS scale: 2/10 Pain location: right thigh radiating down to her right ankle Pain description: constant aching and throbbing  Aggravating factors: none known Relieving factors: none   PRECAUTIONS: None  RED FLAGS: None   WEIGHT BEARING RESTRICTIONS: No  FALLS:  Has patient fallen in last 6 months? No  LIVING ENVIRONMENT: Lives with: lives with their family Lives in: House/apartment Stairs: Yes: Internal: 12-14 steps; on  right going up and External: 7 steps; can reach both; patient reports a step to pattern ascending stairs and sitting down and sliding down her stairs to go down Has following equipment at home: Walker - 2 wheeled  OCCUPATION: not working  PLOF: Independent  PATIENT GOALS: reduced pain, improved mobility, and be able to watch her grandchildren (the oldest is 56 years old and the youngest is 57 years old)   NEXT MD VISIT: 07/17/23  OBJECTIVE:   DIAGNOSTIC  FINDINGS: 07/02/23 right knee x-ray IMPRESSION: Right knee arthroplasty without immediate postoperative complication.  PATIENT SURVEYS:  LEFS 16/80  COGNITION: Overall cognitive status: Within functional limits for tasks assessed     SENSATION: Patient reports no numbness or tingling  EDEMA:  Circumferential: Right tibiofemoral joint line: 46.5 cm Left tibiofemoral joint line: 42.0 cm   PALPATION: TTP: right hamstrings insertion, gastrocnemius/soleus, and medial and lateral joint lines  LOWER EXTREMITY ROM:  Active ROM Right eval Left eval  Hip flexion    Hip extension    Hip abduction    Hip adduction    Hip internal rotation    Hip external rotation    Knee flexion 73/ 84; limited by pain  93  Knee extension 14; limited by pain  0  Ankle dorsiflexion    Ankle plantarflexion    Ankle inversion    Ankle eversion     (Blank rows = not tested)  LOWER EXTREMITY MMT: not tested due to surgical condition  LOWER EXTREMITY SPECIAL TESTS:  Not tested due to surgical condition  FUNCTIONAL TESTS:  Significant difficulty with sit to stand transfers  GAIT: Assistive device utilized: Environmental consultant - 2 wheeled Level of assistance: Modified independence Comments: decreased gait speed and stride length   TODAY'S TREATMENT:                                                                                                                              DATE:                                    08/07/23 EXERCISE LOG    RT knee  Exercise Repetitions and Resistance Comments  Nustep  L4 x 15 minutes; seat 7-5   Rocker board  3 minutes    Lunge 14" box x 3 mins   Forward Step Ups 6" box x 25 reps   Seated Hip Abduction Red x 3 mins   Seated Marches 3# x 25 reps BUE support  LAQ 3# x 25 reps RLE only   Seated Ham Curls Red x 25 reps    Blank cell = exercise not performed today                                    07/19/23 EXERCISE LOG  Exercise Repetitions and  Resistance Comments   Rocker board  5 minutes    Marching on foam  3 minutes    Standing HS stretch  4 x 30 seconds  RLE only   Nustep  L4 x 17 minutes; seat 8-5   Lunge onto step  8" step x 3 minutes    Standing HS curl  15 reps each     Blank cell = exercise not performed today                                      PATIENT EDUCATION:  Education details: POC, benefits of HEP, healing, prognosis, and goals for therapy Person educated: Patient Education method: Explanation Education comprehension: verbalized understanding  HOME EXERCISE PROGRAM:   ASSESSMENT:  CLINICAL IMPRESSION: Pt arrives for today's treatment session reporting 2/10 right knee pain.  Pt able to tolerate increased resistance and/or weight with all exercises performed today.  Pt reports that she is pleased with her progress at this point.  Progress note needed at next session.  Pt reported very minimal pain at completion of today's treatment session.   OBJECTIVE IMPAIRMENTS: Abnormal gait, decreased activity tolerance, decreased mobility, difficulty walking, decreased ROM, decreased strength, hypomobility, increased edema, impaired tone, and pain.   ACTIVITY LIMITATIONS: carrying, lifting, sitting, standing, squatting, sleeping, stairs, transfers, bed mobility, and locomotion level  PARTICIPATION LIMITATIONS: meal prep, cleaning, laundry, driving, shopping, community activity, and yard work  PERSONAL FACTORS: Transportation and 3+ comorbidities: Osteoarthritis, anxiety, chronic kidney disease, and hypertension  are also affecting patient's functional outcome.   REHAB POTENTIAL: Good  CLINICAL DECISION MAKING: Evolving/moderate complexity  EVALUATION COMPLEXITY: Moderate   GOALS: Goals reviewed with patient? Yes  SHORT TERM GOALS: Target date: 07/30/23 Patient will be independent with her initial HEP.  Baseline: Goal status: MET  2.  Patient will be able to improve her right knee extension within 10 degrees of neutral for  improved gait mechanics.  Baseline:   Goal status: MET  3.  Patient will be able to demonstrate right knee flexion to 85 degrees or greater for improved function squatting.  Baseline: 81 degrees Goal status: MET  4.  Patient will improve her LEFS score to 26/80 or greater for improved perceived functional mobility.  Baseline: 38/80 on 07/23/23 Goal status: MET  LONG TERM GOALS: Target date: 08/20/23  Patient will be independent with her advanced HEP.  Baseline:  Goal status: ON GOING  2.  Patient will improve her active right knee extension within 5 degrees of neutral for improved gait mechanics.  Baseline:  Goal status: ON GOING  3.  Patient will improve her right knee flexion to 105 degrees or greater for improved function navigating stairs.  Baseline:  Goal status: ON GOING  4.  Patient will be able to descend stairs with at least a step to pattern for improved household mobility.  Baseline:  Goal status: ON GOING  5.  Patient will improve her LEFS score to 36/80 or greater for improved function with her daily activities.  Baseline: 38/80 on 07/23/23 Goal status: MET  PLAN:  PT FREQUENCY: 2x/week  PT DURATION: 6 weeks  PLANNED INTERVENTIONS: Therapeutic exercises, Therapeutic activity, Neuromuscular re-education, Balance training, Gait training, Patient/Family education, Self Care, Joint mobilization, Stair training, Manual therapy, and Re-evaluation  PLAN FOR NEXT SESSION: Progress Note, ROM   Newman Pies, PTA 08/07/2023, 9:13 AM

## 2023-08-10 ENCOUNTER — Ambulatory Visit: Payer: Medicaid Other | Attending: Orthopedic Surgery

## 2023-08-10 DIAGNOSIS — M25561 Pain in right knee: Secondary | ICD-10-CM | POA: Insufficient documentation

## 2023-08-10 DIAGNOSIS — R600 Localized edema: Secondary | ICD-10-CM | POA: Diagnosis present

## 2023-08-10 DIAGNOSIS — M25661 Stiffness of right knee, not elsewhere classified: Secondary | ICD-10-CM | POA: Diagnosis present

## 2023-08-10 NOTE — Therapy (Signed)
OUTPATIENT PHYSICAL THERAPY LOWER EXTREMITY TREATMENT   Patient Name: Patricia Carrillo MRN: [id number] DOB:1965-11-17, 57 y.o., female Today's Date: 08/10/2023  END OF SESSION:  PT End of Session - 08/10/23 0806     Visit Number 10    Number of Visits 12    Date for PT Re-Evaluation 09/07/23    PT Start Time 0800    PT Stop Time 0845    PT Time Calculation (min) 45 min    Activity Tolerance Patient tolerated treatment well    Behavior During Therapy [facility] for tasks assessed/performed               Past Medical History:  Diagnosis Date   Anxiety    Arthritis    Chronic kidney disease    "stage 3" per pt   Fatigue    Headache    History of kidney stones    Hypertension    Obesity 01/16/2022   takes metformin and ozempic "for weight loss, not diabetic"   Pre-diabetes    Sleep apnea    dx 15 years ago, never used CPAP   Past Surgical History:  Procedure Laterality Date   CHOLECYSTECTOMY     LAPAROSCOPIC HYSTERECTOMY     muiltiple knee arthroscopies bilateral      REVERSE SHOULDER ARTHROPLASTY Left 08/17/2022   Procedure: REVERSE SHOULDER ARTHROPLASTY;  Surgeon: Bjorn Pippin, MD;  Location: [facility];  Service: Orthopedics;  Laterality: Left;   SHOULDER ARTHROSCOPY WITH DISTAL CLAVICLE RESECTION Left 01/26/2022   Procedure: SHOULDER ARTHROSCOPY WITH DISTAL CLAVICLE EXCISION/ DEBRIDEMENT;  Surgeon: Bjorn Pippin, MD;  Location: [facility];  Service: Orthopedics;  Laterality: Left;   SHOULDER ARTHROSCOPY WITH SUBACROMIAL DECOMPRESSION, ROTATOR CUFF REPAIR AND BICEP TENDON REPAIR Left 01/26/2022   Procedure: SHOULDER ARTHROSCOPY WITH SUBACROMIAL DECOMPRESSION, ROTATOR CUFF REPAIR;  Surgeon: Bjorn Pippin, MD;  Location: [facility];  Service: Orthopedics;  Laterality: Left;   TONSILLECTOMY     TOTAL KNEE ARTHROPLASTY Left 02/14/2023   Procedure: TOTAL KNEE ARTHROPLASTY;  Surgeon: Joen Laura, MD;  Location: [facility];   Service: Orthopedics;  Laterality: Left;   TOTAL KNEE ARTHROPLASTY Right 07/02/2023   Procedure: TOTAL KNEE ARTHROPLASTY;  Surgeon: Joen Laura, MD;  Location: [facility];  Service: Orthopedics;  Laterality: Right;   TUBAL LIGATION     Patient Active Problem List   Diagnosis Date Noted   Chronic right shoulder pain 02/08/2023   Lumbar degenerative disc disease 11/30/2021   Status post total shoulder arthroplasty, left 03/14/2021   Primary osteoarthritis of both knees 03/14/2021    PCP: April Manson, NP  REFERRING PROVIDER: Joen Laura, MD  REFERRING DIAG: POST OP RIGHT TOTAL KNEE REPLACEMENT  THERAPY DIAG:  Stiffness of right knee, not elsewhere classified  Acute pain of right knee  Localized edema  Rationale for Evaluation and Treatment: Rehabilitation  ONSET DATE: 07/02/23  SUBJECTIVE:   SUBJECTIVE STATEMENT: Pt reports 2/10 right knee pain today.   PERTINENT HISTORY: Osteoarthritis, anxiety, chronic kidney disease, and hypertension PAIN:  Are you having pain? Yes: NPRS scale: 2/10 Pain location: right thigh radiating down to her right ankle Pain description: constant aching and throbbing  Aggravating factors: none known Relieving factors: none   PRECAUTIONS: None  RED FLAGS: None   WEIGHT BEARING RESTRICTIONS: No  FALLS:  Has patient fallen in last 6 months? No  LIVING ENVIRONMENT: Lives with: lives with their family Lives in: House/apartment Stairs: Yes: Internal: 12-14 steps; on  right going up and External: 7 steps; can reach both; patient reports a step to pattern ascending stairs and sitting down and sliding down her stairs to go down Has following equipment at home: Walker - 2 wheeled  OCCUPATION: not working  PLOF: Independent  PATIENT GOALS: reduced pain, improved mobility, and be able to watch her grandchildren (the oldest is 19 years old and the youngest is 57 years old)   NEXT MD VISIT: 07/17/23  OBJECTIVE:   DIAGNOSTIC  FINDINGS: 07/02/23 right knee x-ray IMPRESSION: Right knee arthroplasty without immediate postoperative complication.  PATIENT SURVEYS:  LEFS 16/80  COGNITION: Overall cognitive status: Within functional limits for tasks assessed     SENSATION: Patient reports no numbness or tingling  EDEMA:  Circumferential: Right tibiofemoral joint line: 46.5 cm Left tibiofemoral joint line: 42.0 cm   PALPATION: TTP: right hamstrings insertion, gastrocnemius/soleus, and medial and lateral joint lines  LOWER EXTREMITY ROM:  Active ROM Right eval Left eval  Hip flexion    Hip extension    Hip abduction    Hip adduction    Hip internal rotation    Hip external rotation    Knee flexion 73/ 84; limited by pain  93  Knee extension 14; limited by pain  0  Ankle dorsiflexion    Ankle plantarflexion    Ankle inversion    Ankle eversion     (Blank rows = not tested)  LOWER EXTREMITY MMT: not tested due to surgical condition  LOWER EXTREMITY SPECIAL TESTS:  Not tested due to surgical condition  FUNCTIONAL TESTS:  Significant difficulty with sit to stand transfers  GAIT: Assistive device utilized: Environmental consultant - 2 wheeled Level of assistance: Modified independence Comments: decreased gait speed and stride length   TODAY'S TREATMENT:                                                                                                                              DATE:                                    08/10/23 EXERCISE LOG    RT knee  Exercise Repetitions and Resistance Comments  Nustep  L4 x 15 minutes; seat 6-5   Rocker board  5 minutes    Lunge 14" box x 5 mins   Forward Step Ups 6" box x 25 reps   Seated Hip Abduction    Standing Marches Airex x 2 mins BUE support  LAQ 4# x 25 reps  RLE only   Seated Ham Curls Green x 25 reps    Blank cell = exercise not performed today                                    07/19/23 EXERCISE LOG  Exercise Repetitions and Resistance Comments  Rocker  board  5 minutes    Marching on foam  3 minutes    Standing HS stretch  4 x 30 seconds  RLE only   Nustep  L4 x 17 minutes; seat 8-5   Lunge onto step  8" step x 3 minutes    Standing HS curl  15 reps each     Blank cell = exercise not performed today                                      PATIENT EDUCATION:  Education details: POC, benefits of HEP, healing, prognosis, and goals for therapy Person educated: Patient Education method: Explanation Education comprehension: verbalized understanding  HOME EXERCISE PROGRAM:   ASSESSMENT:  CLINICAL IMPRESSION: Pt arrives for today's treatment session reporting 2/10 right knee pain.  Pt able to demonstrate 102 degrees of right knee flexion and -4 degrees of right knee extension.  Pt has met her LTG for extension at this time.  Pt able to tolerate increased reps or resistance with all exercises performed today.  Pt denied any change in pain at completion of today's treatment session.  OBJECTIVE IMPAIRMENTS: Abnormal gait, decreased activity tolerance, decreased mobility, difficulty walking, decreased ROM, decreased strength, hypomobility, increased edema, impaired tone, and pain.   ACTIVITY LIMITATIONS: carrying, lifting, sitting, standing, squatting, sleeping, stairs, transfers, bed mobility, and locomotion level  PARTICIPATION LIMITATIONS: meal prep, cleaning, laundry, driving, shopping, community activity, and yard work  PERSONAL FACTORS: Transportation and 3+ comorbidities: Osteoarthritis, anxiety, chronic kidney disease, and hypertension  are also affecting patient's functional outcome.   REHAB POTENTIAL: Good  CLINICAL DECISION MAKING: Evolving/moderate complexity  EVALUATION COMPLEXITY: Moderate   GOALS: Goals reviewed with patient? Yes  SHORT TERM GOALS: Target date: 07/30/23 Patient will be independent with her initial HEP.  Baseline: Goal status: MET  2.  Patient will be able to improve her right knee extension within 10  degrees of neutral for improved gait mechanics.  Baseline:   Goal status: MET  3.  Patient will be able to demonstrate right knee flexion to 85 degrees or greater for improved function squatting.  Baseline: 81 degrees Goal status: MET  4.  Patient will improve her LEFS score to 26/80 or greater for improved perceived functional mobility.  Baseline: 38/80 on 07/23/23 Goal status: MET  LONG TERM GOALS: Target date: 08/20/23  Patient will be independent with her advanced HEP.  Baseline:  Goal status: ON GOING  2.  Patient will improve her active right knee extension within 5 degrees of neutral for improved gait mechanics.  Baseline: 11/1: -4 degrees Goal status: MET  3.  Patient will improve her right knee flexion to 105 degrees or greater for improved function navigating stairs.  Baseline: 11/1: 102 degrees Goal status: ON GOING  4.  Patient will be able to descend stairs with at least a step to pattern for improved household mobility.  Baseline:  Goal status: ON GOING  5.  Patient will improve her LEFS score to 36/80 or greater for improved function with her daily activities.  Baseline: 38/80 on 07/23/23 Goal status: MET  PLAN:  PT FREQUENCY: 2x/week  PT DURATION: 6 weeks  PLANNED INTERVENTIONS: Therapeutic exercises, Therapeutic activity, Neuromuscular re-education, Balance training, Gait training, Patient/Family education, Self Care, Joint mobilization, Stair training, Manual therapy, and Re-evaluation  PLAN FOR NEXT SESSION: Progress Note, ROM   Newman Pies, PTA 08/10/2023, 9:01  AM

## 2023-08-13 ENCOUNTER — Ambulatory Visit: Payer: Medicaid Other

## 2023-08-13 DIAGNOSIS — R600 Localized edema: Secondary | ICD-10-CM

## 2023-08-13 DIAGNOSIS — M25561 Pain in right knee: Secondary | ICD-10-CM

## 2023-08-13 DIAGNOSIS — M25661 Stiffness of right knee, not elsewhere classified: Secondary | ICD-10-CM | POA: Diagnosis not present

## 2023-08-13 NOTE — Therapy (Signed)
OUTPATIENT PHYSICAL THERAPY LOWER EXTREMITY TREATMENT   Patient Name: Patricia Carrillo MRN: [id number] DOB:10-02-1966, 57 y.o., female Today's Date: 08/13/2023  END OF SESSION:  PT End of Session - 08/13/23 0804     Visit Number 11    Number of Visits 12    Date for PT Re-Evaluation 09/07/23    PT Start Time 0800    PT Stop Time 0845    PT Time Calculation (min) 45 min    Activity Tolerance Patient tolerated treatment well    Behavior During Therapy [facility] for tasks assessed/performed               Past Medical History:  Diagnosis Date   Anxiety    Arthritis    Chronic kidney disease    "stage 3" per pt   Fatigue    Headache    History of kidney stones    Hypertension    Obesity 01/16/2022   takes metformin and ozempic "for weight loss, not diabetic"   Pre-diabetes    Sleep apnea    dx 15 years ago, never used CPAP   Past Surgical History:  Procedure Laterality Date   CHOLECYSTECTOMY     LAPAROSCOPIC HYSTERECTOMY     muiltiple knee arthroscopies bilateral      REVERSE SHOULDER ARTHROPLASTY Left 08/17/2022   Procedure: REVERSE SHOULDER ARTHROPLASTY;  Surgeon: Bjorn Pippin, MD;  Location: [facility];  Service: Orthopedics;  Laterality: Left;   SHOULDER ARTHROSCOPY WITH DISTAL CLAVICLE RESECTION Left 01/26/2022   Procedure: SHOULDER ARTHROSCOPY WITH DISTAL CLAVICLE EXCISION/ DEBRIDEMENT;  Surgeon: Bjorn Pippin, MD;  Location: [facility];  Service: Orthopedics;  Laterality: Left;   SHOULDER ARTHROSCOPY WITH SUBACROMIAL DECOMPRESSION, ROTATOR CUFF REPAIR AND BICEP TENDON REPAIR Left 01/26/2022   Procedure: SHOULDER ARTHROSCOPY WITH SUBACROMIAL DECOMPRESSION, ROTATOR CUFF REPAIR;  Surgeon: Bjorn Pippin, MD;  Location: [facility];  Service: Orthopedics;  Laterality: Left;   TONSILLECTOMY     TOTAL KNEE ARTHROPLASTY Left 02/14/2023   Procedure: TOTAL KNEE ARTHROPLASTY;  Surgeon: Joen Laura, MD;  Location: [facility];   Service: Orthopedics;  Laterality: Left;   TOTAL KNEE ARTHROPLASTY Right 07/02/2023   Procedure: TOTAL KNEE ARTHROPLASTY;  Surgeon: Joen Laura, MD;  Location: [facility];  Service: Orthopedics;  Laterality: Right;   TUBAL LIGATION     Patient Active Problem List   Diagnosis Date Noted   Chronic right shoulder pain 02/08/2023   Lumbar degenerative disc disease 11/30/2021   Status post total shoulder arthroplasty, left 03/14/2021   Primary osteoarthritis of both knees 03/14/2021    PCP: April Manson, NP  REFERRING PROVIDER: Joen Laura, MD  REFERRING DIAG: POST OP RIGHT TOTAL KNEE REPLACEMENT  THERAPY DIAG:  Stiffness of right knee, not elsewhere classified  Acute pain of right knee  Localized edema  Rationale for Evaluation and Treatment: Rehabilitation  ONSET DATE: 07/02/23  SUBJECTIVE:   SUBJECTIVE STATEMENT: Pt reports 2/10 right knee pain today.   PERTINENT HISTORY: Osteoarthritis, anxiety, chronic kidney disease, and hypertension PAIN:  Are you having pain? Yes: NPRS scale: 2/10 Pain location: right thigh radiating down to her right ankle Pain description: constant aching and throbbing  Aggravating factors: none known Relieving factors: none   PRECAUTIONS: None  RED FLAGS: None   WEIGHT BEARING RESTRICTIONS: No  FALLS:  Has patient fallen in last 6 months? No  LIVING ENVIRONMENT: Lives with: lives with their family Lives in: House/apartment Stairs: Yes: Internal: 12-14 steps; on  right going up and External: 7 steps; can reach both; patient reports a step to pattern ascending stairs and sitting down and sliding down her stairs to go down Has following equipment at home: Walker - 2 wheeled  OCCUPATION: not working  PLOF: Independent  PATIENT GOALS: reduced pain, improved mobility, and be able to watch her grandchildren (the oldest is 31 years old and the youngest is 57 years old)   NEXT MD VISIT: 07/17/23  OBJECTIVE:   DIAGNOSTIC  FINDINGS: 07/02/23 right knee x-ray IMPRESSION: Right knee arthroplasty without immediate postoperative complication.  PATIENT SURVEYS:  LEFS 16/80  COGNITION: Overall cognitive status: Within functional limits for tasks assessed     SENSATION: Patient reports no numbness or tingling  EDEMA:  Circumferential: Right tibiofemoral joint line: 46.5 cm Left tibiofemoral joint line: 42.0 cm   PALPATION: TTP: right hamstrings insertion, gastrocnemius/soleus, and medial and lateral joint lines  LOWER EXTREMITY ROM:  Active ROM Right eval Left eval  Hip flexion    Hip extension    Hip abduction    Hip adduction    Hip internal rotation    Hip external rotation    Knee flexion 73/ 84; limited by pain  93  Knee extension 14; limited by pain  0  Ankle dorsiflexion    Ankle plantarflexion    Ankle inversion    Ankle eversion     (Blank rows = not tested)  LOWER EXTREMITY MMT: not tested due to surgical condition  LOWER EXTREMITY SPECIAL TESTS:  Not tested due to surgical condition  FUNCTIONAL TESTS:  Significant difficulty with sit to stand transfers  GAIT: Assistive device utilized: Environmental consultant - 2 wheeled Level of assistance: Modified independence Comments: decreased gait speed and stride length   TODAY'S TREATMENT:                                                                                                                              DATE:                                    08/13/23 EXERCISE LOG    RT knee  Exercise Repetitions and Resistance Comments  Nustep  L4 x 15 minutes; seat 6-5   Rocker board  5 minutes    Lunge 14" box x 5 mins   Forward Step Ups 6" box x 30 reps   Heel Slides With over pressure to fatigue   Standing Marches Airex x 2 mins BUE support  LAQ 4# x 30 reps  RLE only   Seated Ham Curls Green x 30 reps    Blank cell = exercise not performed today                                    07/19/23 EXERCISE LOG  Exercise Repetitions  and Resistance  Comments  Rocker board  5 minutes    Marching on foam  3 minutes    Standing HS stretch  4 x 30 seconds  RLE only   Nustep  L4 x 17 minutes; seat 8-5   Lunge onto step  8" step x 3 minutes    Standing HS curl  15 reps each     Blank cell = exercise not performed today                                      PATIENT EDUCATION:  Education details: POC, benefits of HEP, healing, prognosis, and goals for therapy Person educated: Patient Education method: Explanation Education comprehension: verbalized understanding  HOME EXERCISE PROGRAM:   ASSESSMENT:  CLINICAL IMPRESSION: Pt arrives for today's treatment session reporting 2/10 right knee pain.  Pt able to tolerate increased reps with all exercises performed today.  Heel slides performed with manual over pressure and gentle hold at end range.  Pt ready for discharge at next treatment session.  Pt denied any pain at completion of today's treatment session.  OBJECTIVE IMPAIRMENTS: Abnormal gait, decreased activity tolerance, decreased mobility, difficulty walking, decreased ROM, decreased strength, hypomobility, increased edema, impaired tone, and pain.   ACTIVITY LIMITATIONS: carrying, lifting, sitting, standing, squatting, sleeping, stairs, transfers, bed mobility, and locomotion level  PARTICIPATION LIMITATIONS: meal prep, cleaning, laundry, driving, shopping, community activity, and yard work  PERSONAL FACTORS: Transportation and 3+ comorbidities: Osteoarthritis, anxiety, chronic kidney disease, and hypertension  are also affecting patient's functional outcome.   REHAB POTENTIAL: Good  CLINICAL DECISION MAKING: Evolving/moderate complexity  EVALUATION COMPLEXITY: Moderate   GOALS: Goals reviewed with patient? Yes  SHORT TERM GOALS: Target date: 07/30/23 Patient will be independent with her initial HEP.  Baseline: Goal status: MET  2.  Patient will be able to improve her right knee extension within 10 degrees of neutral for  improved gait mechanics.  Baseline:   Goal status: MET  3.  Patient will be able to demonstrate right knee flexion to 85 degrees or greater for improved function squatting.  Baseline: 81 degrees Goal status: MET  4.  Patient will improve her LEFS score to 26/80 or greater for improved perceived functional mobility.  Baseline: 38/80 on 07/23/23 Goal status: MET  LONG TERM GOALS: Target date: 08/20/23  Patient will be independent with her advanced HEP.  Baseline:  Goal status: ON GOING  2.  Patient will improve her active right knee extension within 5 degrees of neutral for improved gait mechanics.  Baseline: 11/1: -4 degrees Goal status: MET  3.  Patient will improve her right knee flexion to 105 degrees or greater for improved function navigating stairs.  Baseline: 11/1: 102 degrees Goal status: ON GOING  4.  Patient will be able to descend stairs with at least a step to pattern for improved household mobility.  Baseline:  Goal status: ON GOING  5.  Patient will improve her LEFS score to 36/80 or greater for improved function with her daily activities.  Baseline: 38/80 on 07/23/23 Goal status: MET  PLAN:  PT FREQUENCY: 2x/week  PT DURATION: 6 weeks  PLANNED INTERVENTIONS: Therapeutic exercises, Therapeutic activity, Neuromuscular re-education, Balance training, Gait training, Patient/Family education, Self Care, Joint mobilization, Stair training, Manual therapy, and Re-evaluation  PLAN FOR NEXT SESSION: Progress Note, ROM   Newman Pies, PTA 08/13/2023, 9:30 AM

## 2023-08-15 ENCOUNTER — Ambulatory Visit: Payer: Medicaid Other

## 2023-08-15 DIAGNOSIS — M25661 Stiffness of right knee, not elsewhere classified: Secondary | ICD-10-CM | POA: Diagnosis not present

## 2023-08-15 DIAGNOSIS — R600 Localized edema: Secondary | ICD-10-CM

## 2023-08-15 DIAGNOSIS — M25561 Pain in right knee: Secondary | ICD-10-CM

## 2023-08-15 NOTE — Therapy (Addendum)
OUTPATIENT PHYSICAL THERAPY LOWER EXTREMITY TREATMENT   Patient Name: Patricia Carrillo MRN: [id number] DOB:13-Sep-1966, 57 y.o., female Today's Date: 08/15/2023  END OF SESSION:  PT End of Session - 08/15/23 0813     Visit Number 12    Number of Visits 12    Date for PT Re-Evaluation 09/07/23    PT Start Time 0800    PT Stop Time 0845    PT Time Calculation (min) 45 min    Activity Tolerance Patient tolerated treatment well    Behavior During Therapy [facility] for tasks assessed/performed               Past Medical History:  Diagnosis Date   Anxiety    Arthritis    Chronic kidney disease    "stage 3" per pt   Fatigue    Headache    History of kidney stones    Hypertension    Obesity 01/16/2022   takes metformin and ozempic "for weight loss, not diabetic"   Pre-diabetes    Sleep apnea    dx 15 years ago, never used CPAP   Past Surgical History:  Procedure Laterality Date   CHOLECYSTECTOMY     LAPAROSCOPIC HYSTERECTOMY     muiltiple knee arthroscopies bilateral      REVERSE SHOULDER ARTHROPLASTY Left 08/17/2022   Procedure: REVERSE SHOULDER ARTHROPLASTY;  Surgeon: Bjorn Pippin, MD;  Location: [facility];  Service: Orthopedics;  Laterality: Left;   SHOULDER ARTHROSCOPY WITH DISTAL CLAVICLE RESECTION Left 01/26/2022   Procedure: SHOULDER ARTHROSCOPY WITH DISTAL CLAVICLE EXCISION/ DEBRIDEMENT;  Surgeon: Bjorn Pippin, MD;  Location: [facility];  Service: Orthopedics;  Laterality: Left;   SHOULDER ARTHROSCOPY WITH SUBACROMIAL DECOMPRESSION, ROTATOR CUFF REPAIR AND BICEP TENDON REPAIR Left 01/26/2022   Procedure: SHOULDER ARTHROSCOPY WITH SUBACROMIAL DECOMPRESSION, ROTATOR CUFF REPAIR;  Surgeon: Bjorn Pippin, MD;  Location: [facility];  Service: Orthopedics;  Laterality: Left;   TONSILLECTOMY     TOTAL KNEE ARTHROPLASTY Left 02/14/2023   Procedure: TOTAL KNEE ARTHROPLASTY;  Surgeon: Joen Laura, MD;  Location: [facility];   Service: Orthopedics;  Laterality: Left;   TOTAL KNEE ARTHROPLASTY Right 07/02/2023   Procedure: TOTAL KNEE ARTHROPLASTY;  Surgeon: Joen Laura, MD;  Location: [facility];  Service: Orthopedics;  Laterality: Right;   TUBAL LIGATION     Patient Active Problem List   Diagnosis Date Noted   Chronic right shoulder pain 02/08/2023   Lumbar degenerative disc disease 11/30/2021   Status post total shoulder arthroplasty, left 03/14/2021   Primary osteoarthritis of both knees 03/14/2021    PCP: April Manson, NP  REFERRING PROVIDER: Joen Laura, MD  REFERRING DIAG: POST OP RIGHT TOTAL KNEE REPLACEMENT  THERAPY DIAG:  Stiffness of right knee, not elsewhere classified  Acute pain of right knee  Localized edema  Rationale for Evaluation and Treatment: Rehabilitation  ONSET DATE: 07/02/23  SUBJECTIVE:   SUBJECTIVE STATEMENT: Pt reports 2/10 right knee pain today.  Pt reports that PA yesterday mentioned manipulation, but she is pleased with her progress and happy with the activities she is able to perform.   PERTINENT HISTORY: Osteoarthritis, anxiety, chronic kidney disease, and hypertension PAIN:  Are you having pain? Yes: NPRS scale: 2/10 Pain location: right thigh radiating down to her right ankle Pain description: constant aching and throbbing  Aggravating factors: none known Relieving factors: none   PRECAUTIONS: None  RED FLAGS: None   WEIGHT BEARING RESTRICTIONS: No  FALLS:  Has  patient fallen in last 6 months? No  LIVING ENVIRONMENT: Lives with: lives with their family Lives in: House/apartment Stairs: Yes: Internal: 12-14 steps; on right going up and External: 7 steps; can reach both; patient reports a step to pattern ascending stairs and sitting down and sliding down her stairs to go down Has following equipment at home: Walker - 2 wheeled  OCCUPATION: not working  PLOF: Independent  PATIENT GOALS: reduced pain, improved mobility, and be able  to watch her grandchildren (the oldest is 33 years old and the youngest is 57 years old)   NEXT MD VISIT: 07/17/23  OBJECTIVE:   DIAGNOSTIC FINDINGS: 07/02/23 right knee x-ray IMPRESSION: Right knee arthroplasty without immediate postoperative complication.  PATIENT SURVEYS:  LEFS 16/80  COGNITION: Overall cognitive status: Within functional limits for tasks assessed     SENSATION: Patient reports no numbness or tingling  EDEMA:  Circumferential: Right tibiofemoral joint line: 46.5 cm Left tibiofemoral joint line: 42.0 cm   PALPATION: TTP: right hamstrings insertion, gastrocnemius/soleus, and medial and lateral joint lines  LOWER EXTREMITY ROM:  Active ROM Right eval Left eval  Hip flexion    Hip extension    Hip abduction    Hip adduction    Hip internal rotation    Hip external rotation    Knee flexion 73/ 84; limited by pain  93  Knee extension 14; limited by pain  0  Ankle dorsiflexion    Ankle plantarflexion    Ankle inversion    Ankle eversion     (Blank rows = not tested)  LOWER EXTREMITY MMT: not tested due to surgical condition  LOWER EXTREMITY SPECIAL TESTS:  Not tested due to surgical condition  FUNCTIONAL TESTS:  Significant difficulty with sit to stand transfers  GAIT: Assistive device utilized: Environmental consultant - 2 wheeled Level of assistance: Modified independence Comments: decreased gait speed and stride length   TODAY'S TREATMENT:                                                                                                                              DATE:                                    08/15/23 EXERCISE LOG    RT knee  Exercise Repetitions and Resistance Comments  Nustep  L4 x 15 minutes; seat 6-5   Rocker board  5 minutes    Lunge 14" box x 5 mins   Forward Step Ups    Heel Slides With over pressure to fatigue   Standing Marches Airex x 2 mins BUE support  LAQ 5# x 30 reps  RLE only   Seated Ham Curls Green x 30 reps    Blank cell =  exercise not performed today  07/19/23 EXERCISE LOG  Exercise Repetitions and Resistance Comments  Rocker board  5 minutes    Marching on foam  3 minutes    Standing HS stretch  4 x 30 seconds  RLE only   Nustep  L4 x 17 minutes; seat 8-5   Lunge onto step  8" step x 3 minutes    Standing HS curl  15 reps each     Blank cell = exercise not performed today                                      PATIENT EDUCATION:  Education details: POC, benefits of HEP, healing, prognosis, and goals for therapy Person educated: Patient Education method: Explanation Education comprehension: verbalized understanding  HOME EXERCISE PROGRAM:   ASSESSMENT:  CLINICAL IMPRESSION: Pt arrives for today's treatment session reporting 2/10 right knee pain.  Pt able to navigate four steps with reciprocal pattern today while using one hand rail, surpassing her LTG.  Pt able to demonstrate 105 degrees of right knee flexion meeting her LTG for ROM as well.  Pt pleased with her progress and is able to perform all functional activities throughout her day without issue.  Pt encouraged to call the facility with any questions or concerns.  Pt denied any change in pain at completion of today's treatment session.  Pt ready for discharge at this time.   PHYSICAL THERAPY DISCHARGE SUMMARY  Visits from Start of Care: 12  Current functional level related to goals / functional outcomes: Patient was able to meet all her goals for skilled physical therapy and felt comfortable being discharged at this time.    Remaining deficits: none   Education / Equipment: HEP   Patient agrees to discharge. Patient goals were met. Patient is being discharged due to meeting the stated rehab goals.  Candi Leash, PT, DPT    OBJECTIVE IMPAIRMENTS: Abnormal gait, decreased activity tolerance, decreased mobility, difficulty walking, decreased ROM, decreased strength, hypomobility, increased edema,  impaired tone, and pain.   ACTIVITY LIMITATIONS: carrying, lifting, sitting, standing, squatting, sleeping, stairs, transfers, bed mobility, and locomotion level  PARTICIPATION LIMITATIONS: meal prep, cleaning, laundry, driving, shopping, community activity, and yard work  PERSONAL FACTORS: Transportation and 3+ comorbidities: Osteoarthritis, anxiety, chronic kidney disease, and hypertension  are also affecting patient's functional outcome.   REHAB POTENTIAL: Good  CLINICAL DECISION MAKING: Evolving/moderate complexity  EVALUATION COMPLEXITY: Moderate   GOALS: Goals reviewed with patient? Yes  SHORT TERM GOALS: Target date: 07/30/23 Patient will be independent with her initial HEP.  Baseline: Goal status: MET  2.  Patient will be able to improve her right knee extension within 10 degrees of neutral for improved gait mechanics.  Baseline:   Goal status: MET  3.  Patient will be able to demonstrate right knee flexion to 85 degrees or greater for improved function squatting.  Baseline: 81 degrees Goal status: MET  4.  Patient will improve her LEFS score to 26/80 or greater for improved perceived functional mobility.  Baseline: 38/80 on 07/23/23 Goal status: MET  LONG TERM GOALS: Target date: 08/20/23  Patient will be independent with her advanced HEP.  Baseline:  Goal status: MET  2.  Patient will improve her active right knee extension within 5 degrees of neutral for improved gait mechanics.  Baseline: 11/1: -4 degrees Goal status: MET  3.  Patient will improve her right knee flexion to 105 degrees  or greater for improved function navigating stairs.  Baseline: 11/1: 102 degrees; 11/6: 105 degrees Goal status: MET  4.  Patient will be able to descend stairs with at least a step to pattern for improved household mobility.  Baseline: 11/5: reciprocal pattern Goal status: MET  5.  Patient will improve her LEFS score to 36/80 or greater for improved function with her  daily activities.  Baseline: 38/80 on 07/23/23 Goal status: MET  PLAN:  PT FREQUENCY: 2x/week  PT DURATION: 6 weeks  PLANNED INTERVENTIONS: Therapeutic exercises, Therapeutic activity, Neuromuscular re-education, Balance training, Gait training, Patient/Family education, Self Care, Joint mobilization, Stair training, Manual therapy, and Re-evaluation  PLAN FOR NEXT SESSION: Progress Note, ROM   Newman Pies, PTA 08/15/2023, 10:02 AM

## 2024-03-07 ENCOUNTER — Other Ambulatory Visit (INDEPENDENT_AMBULATORY_CARE_PROVIDER_SITE_OTHER)

## 2024-03-07 ENCOUNTER — Ambulatory Visit: Admitting: Sports Medicine

## 2024-03-07 DIAGNOSIS — M25511 Pain in right shoulder: Secondary | ICD-10-CM

## 2024-03-07 DIAGNOSIS — G8929 Other chronic pain: Secondary | ICD-10-CM

## 2024-03-07 MED ORDER — TRIAMCINOLONE ACETONIDE 40 MG/ML IJ SUSP
40.0000 mg | Freq: Once | INTRAMUSCULAR | Status: AC
Start: 2024-03-07 — End: 2024-03-07
  Administered 2024-03-07: 40 mg via INTRA_ARTICULAR

## 2024-03-07 NOTE — Assessment & Plan Note (Signed)
 Chronic right shoulder pain localized anterior and deltoid, only minimally positive impingement signs, x-rays did show some osteoarthritis, we added home physical therapy, x-rays, we doubled her Neurontin , she gets her narcotics from a pain management provider. Today due to insufficient improvement in nocturnal symptoms we injected her glenohumeral joint today. At the 4-6-week follow-up if she does not improve dramatically we can do a subacromial injection.

## 2024-03-07 NOTE — Addendum Note (Signed)
 Addended by: Burton Casey L on: 03/07/2024 11:35 AM   Modules accepted: Orders

## 2024-03-07 NOTE — Progress Notes (Signed)
    Procedures performed today:    Procedure: Real-time Ultrasound Guided injection of the right glenohumeral joint Device: Samsung HS60  Verbal informed consent obtained.  Time-out conducted.  Noted no overlying erythema, induration, or other signs of local infection.  Skin prepped in a sterile fashion.  Local anesthesia: Topical Ethyl chloride.  With sterile technique and under real time ultrasound guidance: Trace arthritic changes noted, 1 cc Kenalog  40, 2 cc lidocaine , 2 cc bupivacaine  injected easily Completed without difficulty  Advised to call if fevers/chills, erythema, induration, drainage, or persistent bleeding.  Images permanently stored and available for review in PACS.  Impression: Technically successful ultrasound guided injection.  Independent interpretation of notes and tests performed by another provider:   None.  Brief History, Exam, Impression, and Recommendations:    Chronic right shoulder pain Chronic right shoulder pain localized anterior and deltoid, only minimally positive impingement signs, x-rays did show some osteoarthritis, we added home physical therapy, x-rays, we doubled her Neurontin , she gets her narcotics from a pain management provider. Today due to insufficient improvement in nocturnal symptoms we injected her glenohumeral joint today. At the 4-6-week follow-up if she does not improve dramatically we can do a subacromial injection.    ____________________________________________ Joselyn Nicely. Sandy Crumb, M.D., ABFM., CAQSM., AME. Primary Care and Sports Medicine [facility]  Adjunct Professor of [facility] Medicine  [employer]  Restaurant manager, fast food

## 2024-04-16 ENCOUNTER — Ambulatory Visit: Admitting: Sports Medicine

## 2024-04-16 DIAGNOSIS — M25511 Pain in right shoulder: Secondary | ICD-10-CM | POA: Diagnosis not present

## 2024-04-16 DIAGNOSIS — M5136 Other intervertebral disc degeneration, lumbar region with discogenic back pain only: Secondary | ICD-10-CM | POA: Diagnosis not present

## 2024-04-16 DIAGNOSIS — G8929 Other chronic pain: Secondary | ICD-10-CM | POA: Diagnosis not present

## 2024-04-16 MED ORDER — ACETAMINOPHEN ER 650 MG PO TBCR
650.0000 mg | EXTENDED_RELEASE_TABLET | Freq: Three times a day (TID) | ORAL | Status: AC | PRN
Start: 2024-04-16 — End: ?

## 2024-04-16 NOTE — Assessment & Plan Note (Signed)
 Also having chronic axial low back pain, right sided localized over the quadratus lumborum better with flexion. We treated her back in 2023, at that time she was having left-sided L5 distribution radiculitis, she had a left L5-S1 transforaminal epidural, she had about 2 weeks of complete relief and then the pain returned, the procedural pain however was quite severe. She was working with her pain management doctor, she was taking oxycodone , she is on gabapentin  1600 mg twice a day. Now having recurrence of pain, as it is of right sided quadratus lumborum and better with flexion I suspect more of a spinal stenosis versus facetogenic pain generator. She will add arthritis Tylenol  to her other medications and we will add additional home physical therapy for 6 weeks before considering repeat injections. Based on MRI I would initially suggest an interlaminar epidural at L5-S1 followed by right-sided facet joint injections if insufficient improvement.

## 2024-04-16 NOTE — Progress Notes (Signed)
    Procedures performed today:    None.  Independent interpretation of notes and tests performed by another provider:   I did personally review her MRI lumbar spine, small disc protrusions L4-L5 L5-S1 with mild biforaminal stenosis, there is also multilevel facet arthropathy, mild.  Brief History, Exam, Impression, and Recommendations:    Chronic right shoulder pain Chronic right shoulder pain, she had only had minimal impingement signs on exam, x-rays did show some osteoarthritis, at the last visit she had more glenohumeral type pain, I injected her glenohumeral joint with ultrasound guidance and she returns today 90% better with regards to her shoulder pain, she will continue home conditioning and return as needed for this.  Lumbar degenerative disc disease Also having chronic axial low back pain, right sided localized over the quadratus lumborum better with flexion. We treated her back in 2023, at that time she was having left-sided L5 distribution radiculitis, she had a left L5-S1 transforaminal epidural, she had about 2 weeks of complete relief and then the pain returned, the procedural pain however was quite severe. She was working with her pain management doctor, she was taking oxycodone , she is on gabapentin  1600 mg twice a day. Now having recurrence of pain, as it is of right sided quadratus lumborum and better with flexion I suspect more of a spinal stenosis versus facetogenic pain generator. She will add arthritis Tylenol  to her other medications and we will add additional home physical therapy for 6 weeks before considering repeat injections. Based on MRI I would initially suggest an interlaminar epidural at L5-S1 followed by right-sided facet joint injections if insufficient improvement.    ____________________________________________ Debby PARAS. Curtis, M.D., ABFM., CAQSM., AME. Primary Care and Sports Medicine [facility]  Adjunct Professor of  [facility] Medicine  [employer]  Restaurant manager, fast food

## 2024-04-16 NOTE — Assessment & Plan Note (Signed)
 Chronic right shoulder pain, she had only had minimal impingement signs on exam, x-rays did show some osteoarthritis, at the last visit she had more glenohumeral type pain, I injected her glenohumeral joint with ultrasound guidance and she returns today 90% better with regards to her shoulder pain, she will continue home conditioning and return as needed for this.

## 2024-05-28 ENCOUNTER — Ambulatory Visit: Admitting: Sports Medicine

## 2024-05-28 DIAGNOSIS — G8929 Other chronic pain: Secondary | ICD-10-CM

## 2024-05-28 DIAGNOSIS — M25511 Pain in right shoulder: Secondary | ICD-10-CM | POA: Diagnosis not present

## 2024-05-28 NOTE — Assessment & Plan Note (Signed)
 Pleasant 58 year old female returns, chronic right shoulder pain, she had impingement signs on exam initially. We did an injection, glenohumeral May 30 of this year. She had over 90% relief at the follow-up early July. She has had a recurrence of pain. At this point she has failed over 6 weeks of physician directed conservative treatment including home physical therapy, she has done home physical therapy including rotator cuff conditioning, [place] exercises with strengthening, range of motion 15 reps 3 times daily. She has been doing this for over 6 weeks, x-rays unrevealing, oral medication and injection not sufficiently efficacious so we will proceed with MRI looking for rotator cuff tearing. I have encouraged her to continue and work harder with her home physical therapy and she can return for another injection if need be sometime early September, whether we do glenohumeral or subacromial will depend on what structure declares itself as the primary pain generator. She is status post left shoulder arthroplasty.

## 2024-05-28 NOTE — Progress Notes (Signed)
    Procedures performed today:    None.  Independent interpretation of notes and tests performed by another provider:   None.  Brief History, Exam, Impression, and Recommendations:    Chronic right shoulder pain Pleasant 58 year old female returns, chronic right shoulder pain, she had impingement signs on exam initially. We did an injection, glenohumeral May 30 of this year. She had over 90% relief at the follow-up early July. She has had a recurrence of pain. At this point she has failed over 6 weeks of physician directed conservative treatment including home physical therapy, she has done home physical therapy including rotator cuff conditioning, [place] exercises with strengthening, range of motion 15 reps 3 times daily. She has been doing this for over 6 weeks, x-rays unrevealing, oral medication and injection not sufficiently efficacious so we will proceed with MRI looking for rotator cuff tearing. I have encouraged her to continue and work harder with her home physical therapy and she can return for another injection if need be sometime early September, whether we do glenohumeral or subacromial will depend on what structure declares itself as the primary pain generator. She is status post left shoulder arthroplasty.    ____________________________________________ Debby PARAS. Curtis, M.D., ABFM., CAQSM., AME. Primary Care and Sports Medicine [facility]  Adjunct Professor of [facility] Medicine  [employer]  Restaurant manager, fast food

## 2024-05-29 ENCOUNTER — Encounter: Payer: Self-pay | Admitting: Sports Medicine

## 2024-05-29 DIAGNOSIS — M7551 Bursitis of right shoulder: Secondary | ICD-10-CM

## 2024-05-29 DIAGNOSIS — M778 Other enthesopathies, not elsewhere classified: Secondary | ICD-10-CM

## 2024-05-30 MED ORDER — TRIAZOLAM 0.25 MG PO TABS: ORAL_TABLET | ORAL | 0 refills | Status: DC

## 2024-05-30 NOTE — Telephone Encounter (Signed)
 Pt is requesting medication to do her MRI Sunday

## 2024-06-01 ENCOUNTER — Ambulatory Visit

## 2024-06-01 DIAGNOSIS — M25511 Pain in right shoulder: Secondary | ICD-10-CM | POA: Diagnosis not present

## 2024-06-01 DIAGNOSIS — G8929 Other chronic pain: Secondary | ICD-10-CM | POA: Diagnosis not present

## 2024-06-03 ENCOUNTER — Ambulatory Visit: Payer: Self-pay | Admitting: Family Medicine

## 2024-06-03 NOTE — Progress Notes (Signed)
 HI Ginny, I am covering for Dr. ONEIDA as he is no longer at our practice.  Your MRI shows some inflammation of the supraspinatus and infraspinatus tendons in the rotator cuff of your shoulder  there is also some bursitis of the shoulder and some mild arthritis the AC joint. Next step would be formal physical therapy ( we can place a referral and possible injection into the bursa in September with our sports med doc in [place].

## 2024-06-04 NOTE — Telephone Encounter (Signed)
 Orders Placed This Encounter  Procedures  . Ambulatory referral to Physical Therapy    Referral Priority:   Routine    Referral Type:   Physical Medicine    Referral Reason:   Specialty Services Required    Requested Specialty:   Physical Therapy    Number of Visits Requested:   1

## 2024-06-04 NOTE — Addendum Note (Signed)
 Addended by: Juergen Hardenbrook D on: 06/04/2024 04:24 PM   Modules accepted: Orders

## 2024-06-10 ENCOUNTER — Encounter: Payer: Self-pay | Admitting: Sports Medicine

## 2024-06-16 ENCOUNTER — Ambulatory Visit: Admitting: Physical Therapy

## 2024-07-04 ENCOUNTER — Other Ambulatory Visit: Payer: Self-pay

## 2024-07-04 DIAGNOSIS — G8929 Other chronic pain: Secondary | ICD-10-CM

## 2024-07-04 MED ORDER — GABAPENTIN 800 MG PO TABS: 800.0000 mg | ORAL_TABLET | Freq: Two times a day (BID) | ORAL | 0 refills | Status: AC

## 2024-07-08 ENCOUNTER — Ambulatory Visit

## 2024-07-08 ENCOUNTER — Other Ambulatory Visit: Payer: Self-pay

## 2024-07-08 VITALS — BP 110/80

## 2024-07-08 DIAGNOSIS — M7551 Bursitis of right shoulder: Secondary | ICD-10-CM | POA: Diagnosis present

## 2024-07-08 DIAGNOSIS — M7541 Impingement syndrome of right shoulder: Secondary | ICD-10-CM

## 2024-07-08 MED ORDER — METHYLPREDNISOLONE ACETATE 40 MG/ML IJ SUSP
40.0000 mg | Freq: Once | INTRAMUSCULAR | Status: AC
Start: 2024-07-08 — End: 2024-07-08
  Administered 2024-07-08: 40 mg via INTRA_ARTICULAR

## 2024-07-08 NOTE — Progress Notes (Signed)
   Subjective:    Patient ID: Patricia Carrillo, female    DOB: 58 y.o., 1966/05/12   MRN: [id number]  HPI  Chief Complaint: Right shoulder follow-up  Patient reports previously being followed by Dr. Curtis in his office for right shoulder pain. She has received injections in her glenohumeral joint by 1 time which provided about 1 month relief for her. She has not received any other injections. 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     Objective:   Physical Exam Vitals:   07/08/24 1101  BP: 110/80    Right shoulder ( compared to normal ) Inspection: - swelling, - scapular dyskinesis Palpation: TTP equivocal greater tuberosity, - AC joint, - biceps tendon, + posterior shoulder AROM/PROM: 160 forward flexion, 160 abduction, 45 external rotation, internal rotation to SI joint Strength: 5/5 lift off (though painful), 5/5 empty can (though painful), 5/5 external rotation, 5/5 flexion, - drop arm test Special tests:    -Rotator Cuff: ++ Neer's, ++ Hawkin's, - empty can, + painful arc   -Labrum: - O'brien's, - Jerk   -Biceps: + speed's, - yergason's    -AC Joint: - cross arm testing    Assessment & Plan:   Kayle is a pleasant 58 y.o. female presenting for follow-up on right shoulder pain.  After receiving about 4 weeks of relief from the intra-articular glenohumeral joint injection on that side, she has gotten an MRI which shows prominent subacromial bursitis which corroborates a picture of shoulder impingement which I also have an impression of based on my exam today.  I offered her a  subacromial injection and recommend involving physical therapy for further management.  Will schedule follow-up in 6-8 weeks to reassess.  If worsens in the interim, consider oral anti-inflammatory course.  Persisting at time of follow-up, can consider injecting AC joint.

## 2024-07-09 ENCOUNTER — Ambulatory Visit: Attending: Family Medicine | Admitting: Physical Therapy

## 2024-07-09 ENCOUNTER — Other Ambulatory Visit: Payer: Self-pay

## 2024-07-09 DIAGNOSIS — M7551 Bursitis of right shoulder: Secondary | ICD-10-CM | POA: Diagnosis not present

## 2024-07-09 DIAGNOSIS — M62838 Other muscle spasm: Secondary | ICD-10-CM | POA: Diagnosis present

## 2024-07-09 DIAGNOSIS — M25511 Pain in right shoulder: Secondary | ICD-10-CM | POA: Diagnosis present

## 2024-07-09 DIAGNOSIS — G8929 Other chronic pain: Secondary | ICD-10-CM | POA: Insufficient documentation

## 2024-07-09 DIAGNOSIS — M7581 Other shoulder lesions, right shoulder: Secondary | ICD-10-CM | POA: Insufficient documentation

## 2024-07-09 NOTE — Therapy (Signed)
 OUTPATIENT PHYSICAL THERAPY SHOULDER EVALUATION   Patient Name: Patricia Carrillo MRN: [id number] DOB:1966-05-11, 58 y.o., female Today's Date: 07/09/2024  END OF SESSION:  PT End of Session - 07/09/24 0920     Visit Number 1    Number of Visits 8    Date for Recertification  08/06/24    PT Start Time 0843    PT Stop Time 0903    PT Time Calculation (min) 20 min    Activity Tolerance Patient tolerated treatment well    Behavior During Therapy [facility] for tasks assessed/performed          Past Medical History:  Diagnosis Date   Anxiety    Arthritis    Chronic kidney disease    stage 3 per pt   Fatigue    Headache    History of kidney stones    Hypertension    Obesity 01/16/2022   takes metformin and ozempic for weight loss, not diabetic   Pre-diabetes    Sleep apnea    dx 15 years ago, never used CPAP   Past Surgical History:  Procedure Laterality Date   CHOLECYSTECTOMY     LAPAROSCOPIC HYSTERECTOMY     muiltiple knee arthroscopies bilateral      REVERSE SHOULDER ARTHROPLASTY Left 08/17/2022   Procedure: REVERSE SHOULDER ARTHROPLASTY;  Surgeon: Cristy Bonner DASEN, MD;  Location: [facility];  Service: Orthopedics;  Laterality: Left;   SHOULDER ARTHROSCOPY WITH DISTAL CLAVICLE RESECTION Left 01/26/2022   Procedure: SHOULDER ARTHROSCOPY WITH DISTAL CLAVICLE EXCISION/ DEBRIDEMENT;  Surgeon: Cristy Bonner DASEN, MD;  Location: [facility];  Service: Orthopedics;  Laterality: Left;   SHOULDER ARTHROSCOPY WITH SUBACROMIAL DECOMPRESSION, ROTATOR CUFF REPAIR AND BICEP TENDON REPAIR Left 01/26/2022   Procedure: SHOULDER ARTHROSCOPY WITH SUBACROMIAL DECOMPRESSION, ROTATOR CUFF REPAIR;  Surgeon: Cristy Bonner DASEN, MD;  Location: [facility];  Service: Orthopedics;  Laterality: Left;   TONSILLECTOMY     TOTAL KNEE ARTHROPLASTY Left 02/14/2023   Procedure: TOTAL KNEE ARTHROPLASTY;  Surgeon: Edna Toribio LABOR, MD;  Location: [facility];  Service: Orthopedics;   Laterality: Left;   TOTAL KNEE ARTHROPLASTY Right 07/02/2023   Procedure: TOTAL KNEE ARTHROPLASTY;  Surgeon: Edna Toribio LABOR, MD;  Location: [facility];  Service: Orthopedics;  Laterality: Right;   TUBAL LIGATION     Patient Active Problem List   Diagnosis Date Noted   Chronic right shoulder pain 02/08/2023   Lumbar degenerative disc disease 11/30/2021   Status post total shoulder arthroplasty, left 03/14/2021   Primary osteoarthritis of both knees 03/14/2021   REFERRING PROVIDER: Dorothyann Boas  REFERRING DIAG: Acute right shoulder bursitis, tendonitis.    THERAPY DIAG:  Chronic right shoulder pain  Other muscle spasm  Rationale for Evaluation and Treatment: Rehabilitation  ONSET DATE: 4-5 months.    SUBJECTIVE:  SUBJECTIVE STATEMENT: The patient presents to the clinic with c/o right shoulder pain that has been ongoing for 4-5 months.  An earlier injection provided her with about a month of relief.  She also received an injection yesterday.  Bandage intact over left anterior shoulder region.  Her pain is rated at a 5-6/10 today and can rise to higher levels with certain motions, such as reaching into the dryer.  Sometimes heat and medications decrease pain.    PERTINENT HISTORY: Bilateral TKA's, left TSA.    PAIN:  Are you having pain? Yes: NPRS scale: 5-6/10.   Pain location: Right shoulder. Pain description: Ache, sore, throbbing, sharp.   Aggravating factors: As above.   Relieving factors: As above.    PRECAUTIONS: None  RED FLAGS: None   WEIGHT BEARING RESTRICTIONS: No  FALLS:  Has patient fallen in last 6 months? No  LIVING ENVIRONMENT: Lives in: House/apartment Has following equipment at home: None  OCCUPATION: Disabled.    PLOF: Independent  PATIENT GOALS: Use right  shoulder without pain.    NEXT MD VISIT:   OBJECTIVE:  Note: Objective measures were completed at Evaluation unless otherwise noted.  DIAGNOSTIC FINDINGS:  06/01/24:  IMPRESSION: Mild insertional tendinopathy of the supraspinatus and infraspinatus tendons without tear.   Mild to moderate bursal fluid. Correlate for bursitis.   Mild acromioclavicular osteoarthritis.  PATIENT SURVEYS:  Quick DASH:  34 points/52.27%   UPPER EXTREMITY ROM:   Active right shoulder antigravity flexion 120 degrees and full passive.  ER and behind back is full.    UPPER EXTREMITY MMT:  Right shoulder deltoid, IR/ER tested with right elbow by side is 4+/5.   SHOULDER SPECIAL TESTS: (-) right Drop Arm test. No significant pain reproduction with Impingement testing.   PALPATION:  Patient's CC is right shoulder anterior shoulder pain (where Band-Aid is).                                                                                                                               TREATMENT DATE:    PATIENT EDUCATION: Education details:  Person educated:  International aid/development worker:  Education comprehension:   HOME EXERCISE PROGRAM:   ASSESSMENT:  CLINICAL IMPRESSION: The patient presents to [facility] with c/o right shoulder pain.  Her CC is over the right anterior shoulder region.  She had an injection yesterday.  She has no significant strength deficits.  She had limited antigravity flexion due to pain but full passively.  Her Quick DASH score is 34 points/52.27%.  Patient will benefit from skilled physical therapy intervention to address pain and deficits.  OBJECTIVE IMPAIRMENTS: decreased activity tolerance, decreased ROM, and pain.   ACTIVITY LIMITATIONS: carrying, lifting, and reach over head  PARTICIPATION LIMITATIONS: meal prep, cleaning, and laundry  PERSONAL FACTORS: Time since onset of injury/illness/exacerbation are also affecting patient's functional outcome.   REHAB POTENTIAL:  Good  CLINICAL DECISION MAKING: Stable/uncomplicated  EVALUATION COMPLEXITY: Low   GOALS:  SHORT  TERM GOALS: Target date: 08/06/24  Ind with a HEP Goal status: INITIAL  2.  Full antigravity right shoulder flexion.  Goal status: INITIAL  3.  Perform ADL's with pain not > 2-3/10.  Goal status: INITIAL  4.  Improve Quick DASH score by at least 5 points.  Goal status: INITIAL  PLAN:  PT FREQUENCY: 2x/week  PT DURATION: 4 weeks  PLANNED INTERVENTIONS: 97110-Therapeutic exercises, 97530- Therapeutic activity, [id number]- Neuromuscular re-education, 97535- Self Care, [id number]- Manual therapy, and Patient/Family education  PLAN FOR NEXT SESSION: STW/M, RW4, SDLY ER.  PRE's.   Shealyn Sean, [place], PT 07/09/2024, 10:45 AM

## 2024-07-15 ENCOUNTER — Ambulatory Visit: Admitting: Physical Therapy

## 2024-07-15 ENCOUNTER — Other Ambulatory Visit: Payer: Self-pay

## 2024-07-15 DIAGNOSIS — G8929 Other chronic pain: Secondary | ICD-10-CM

## 2024-07-15 DIAGNOSIS — M25511 Pain in right shoulder: Secondary | ICD-10-CM | POA: Diagnosis not present

## 2024-07-15 NOTE — Therapy (Addendum)
 OUTPATIENT PHYSICAL THERAPY SHOULDER TREATMENT   Patient Name: Patricia Carrillo MRN: [id number] DOB:1966/05/04, 58 y.o., female Today's Date: 07/15/2024  END OF SESSION:  PT End of Session - 07/15/24 0903     Visit Number 2    Number of Visits 8    Date for Recertification  08/06/24    PT Start Time 0755    PT Stop Time 0841    PT Time Calculation (min) 46 min    Activity Tolerance Patient tolerated treatment well    Behavior During Therapy [facility] for tasks assessed/performed           Past Medical History:  Diagnosis Date   Anxiety    Arthritis    Chronic kidney disease    stage 3 per pt   Fatigue    Headache    History of kidney stones    Hypertension    Obesity 01/16/2022   takes metformin and ozempic for weight loss, not diabetic   Pre-diabetes    Sleep apnea    dx 15 years ago, never used CPAP   Past Surgical History:  Procedure Laterality Date   CHOLECYSTECTOMY     LAPAROSCOPIC HYSTERECTOMY     muiltiple knee arthroscopies bilateral      REVERSE SHOULDER ARTHROPLASTY Left 08/17/2022   Procedure: REVERSE SHOULDER ARTHROPLASTY;  Surgeon: Cristy Bonner DASEN, MD;  Location: [facility];  Service: Orthopedics;  Laterality: Left;   SHOULDER ARTHROSCOPY WITH DISTAL CLAVICLE RESECTION Left 01/26/2022   Procedure: SHOULDER ARTHROSCOPY WITH DISTAL CLAVICLE EXCISION/ DEBRIDEMENT;  Surgeon: Cristy Bonner DASEN, MD;  Location: [facility];  Service: Orthopedics;  Laterality: Left;   SHOULDER ARTHROSCOPY WITH SUBACROMIAL DECOMPRESSION, ROTATOR CUFF REPAIR AND BICEP TENDON REPAIR Left 01/26/2022   Procedure: SHOULDER ARTHROSCOPY WITH SUBACROMIAL DECOMPRESSION, ROTATOR CUFF REPAIR;  Surgeon: Cristy Bonner DASEN, MD;  Location: [facility];  Service: Orthopedics;  Laterality: Left;   TONSILLECTOMY     TOTAL KNEE ARTHROPLASTY Left 02/14/2023   Procedure: TOTAL KNEE ARTHROPLASTY;  Surgeon: Edna Toribio LABOR, MD;  Location: [facility];  Service:  Orthopedics;  Laterality: Left;   TOTAL KNEE ARTHROPLASTY Right 07/02/2023   Procedure: TOTAL KNEE ARTHROPLASTY;  Surgeon: Edna Toribio LABOR, MD;  Location: [facility];  Service: Orthopedics;  Laterality: Right;   TUBAL LIGATION     Patient Active Problem List   Diagnosis Date Noted   Chronic right shoulder pain 02/08/2023   Lumbar degenerative disc disease 11/30/2021   Status post total shoulder arthroplasty, left 03/14/2021   Primary osteoarthritis of both knees 03/14/2021   REFERRING PROVIDER: Dorothyann Boas  REFERRING DIAG: Acute right shoulder bursitis, tendonitis.    THERAPY DIAG:  Chronic right shoulder pain  Rationale for Evaluation and Treatment: Rehabilitation  ONSET DATE: 4-5 months.    SUBJECTIVE:  SUBJECTIVE STATEMENT: Pain low today.  PERTINENT HISTORY: Bilateral TKA's, left TSA.    PAIN:  Are you having pain? Yes: NPRS scale: 1/10.   Pain location: Right shoulder. Pain description: Ache, sore, throbbing, sharp.   Aggravating factors: As above.   Relieving factors: As above.    PRECAUTIONS: None  RED FLAGS: None   WEIGHT BEARING RESTRICTIONS: No  FALLS:  Has patient fallen in last 6 months? No  LIVING ENVIRONMENT: Lives in: House/apartment Has following equipment at home: None  OCCUPATION: Disabled.    PLOF: Independent  PATIENT GOALS: Use right shoulder without pain.    NEXT MD VISIT:   OBJECTIVE:  Note: Objective measures were completed at Evaluation unless otherwise noted.  DIAGNOSTIC FINDINGS:  06/01/24:  IMPRESSION: Mild insertional tendinopathy of the supraspinatus and infraspinatus tendons without tear.   Mild to moderate bursal fluid. Correlate for bursitis.   Mild acromioclavicular osteoarthritis.  PATIENT SURVEYS:  Quick DASH:  34  points/52.27%   UPPER EXTREMITY ROM:   Active right shoulder antigravity flexion 120 degrees and full passive.  ER and behind back is full.    UPPER EXTREMITY MMT:  Right shoulder deltoid, IR/ER tested with right elbow by side is 4+/5.   SHOULDER SPECIAL TESTS: (-) right Drop Arm test. No significant pain reproduction with Impingement testing.   PALPATION:  Patient's CC is right shoulder anterior shoulder pain (where Band-Aid is).                                                                                                                               TREATMENT DATE:   07/15/24:                                       EXERCISE LOG  Exercise Repetitions and Resistance Comments  UBE 120 RPM's x 10 minutes   RW4 Yellow band all motion performed to fatigue x 2   Standing bicep curls Yellow band performed to fatigue x 2   Standing Tricep extension Yellow band performed to fatigue x 2       STW/M  x 18 minutes to patient's right anterior shoulder, UT/Supraspinatus, middle deltoid and posterior cuff musculature.  PATIENT EDUCATION: Education details: RW79 Person educated: Patient. Education method: Demo Education comprehension: Very good.  HOME EXERCISE PROGRAM:   ASSESSMENT:  CLINICAL IMPRESSION: Patient is highly motivated and did very well with therex today.  Yellow theraband provided for HEP.    OBJECTIVE IMPAIRMENTS: decreased activity tolerance, decreased ROM, and pain.   ACTIVITY LIMITATIONS: carrying, lifting, and reach over head  PARTICIPATION LIMITATIONS: meal prep, cleaning, and laundry  PERSONAL FACTORS: Time since onset of injury/illness/exacerbation are also affecting patient's functional outcome.   REHAB POTENTIAL: Good  CLINICAL DECISION MAKING: Stable/uncomplicated  EVALUATION COMPLEXITY: Low   GOALS:  SHORT TERM GOALS: Target date: 08/06/24  Ind with a HEP  Goal status: INITIAL  2.  Full antigravity right shoulder flexion.  Goal status:  INITIAL  3.  Perform ADL's with pain not > 2-3/10.  Goal status: INITIAL  4.  Improve Quick DASH score by at least 5 points.  Goal status: INITIAL  PLAN:  PT FREQUENCY: 2x/week  PT DURATION: 4 weeks  PLANNED INTERVENTIONS: 97110-Therapeutic exercises, 97530- Therapeutic activity, [id number]- Neuromuscular re-education, 97535- Self Care, 02859- Manual therapy, and Patient/Family education  PLAN FOR NEXT SESSION: STW/M, RW4, SDLY ER.  PRE's.   Tayson Schnelle, [place], PT 07/15/2024, 9:04 AM

## 2024-07-17 ENCOUNTER — Ambulatory Visit

## 2024-07-17 DIAGNOSIS — M62838 Other muscle spasm: Secondary | ICD-10-CM

## 2024-07-17 DIAGNOSIS — G8929 Other chronic pain: Secondary | ICD-10-CM

## 2024-07-17 DIAGNOSIS — M25511 Pain in right shoulder: Secondary | ICD-10-CM | POA: Diagnosis not present

## 2024-07-17 NOTE — Therapy (Signed)
 OUTPATIENT PHYSICAL THERAPY SHOULDER TREATMENT   Patient Name: Patricia Carrillo MRN: [id number] DOB:Jun 24, 1966, 58 y.o., female Today's Date: 07/17/2024  END OF SESSION:  PT End of Session - 07/17/24 0801     Visit Number 3    Number of Visits 8    Date for Recertification  08/06/24    PT Start Time 0800    PT Stop Time 0840    PT Time Calculation (min) 40 min    Activity Tolerance Patient tolerated treatment well    Behavior During Therapy [facility] for tasks assessed/performed           Past Medical History:  Diagnosis Date   Anxiety    Arthritis    Chronic kidney disease    stage 3 per pt   Fatigue    Headache    History of kidney stones    Hypertension    Obesity 01/16/2022   takes metformin and ozempic for weight loss, not diabetic   Pre-diabetes    Sleep apnea    dx 15 years ago, never used CPAP   Past Surgical History:  Procedure Laterality Date   CHOLECYSTECTOMY     LAPAROSCOPIC HYSTERECTOMY     muiltiple knee arthroscopies bilateral      REVERSE SHOULDER ARTHROPLASTY Left 08/17/2022   Procedure: REVERSE SHOULDER ARTHROPLASTY;  Surgeon: Cristy Bonner DASEN, MD;  Location: [facility];  Service: Orthopedics;  Laterality: Left;   SHOULDER ARTHROSCOPY WITH DISTAL CLAVICLE RESECTION Left 01/26/2022   Procedure: SHOULDER ARTHROSCOPY WITH DISTAL CLAVICLE EXCISION/ DEBRIDEMENT;  Surgeon: Cristy Bonner DASEN, MD;  Location: [facility];  Service: Orthopedics;  Laterality: Left;   SHOULDER ARTHROSCOPY WITH SUBACROMIAL DECOMPRESSION, ROTATOR CUFF REPAIR AND BICEP TENDON REPAIR Left 01/26/2022   Procedure: SHOULDER ARTHROSCOPY WITH SUBACROMIAL DECOMPRESSION, ROTATOR CUFF REPAIR;  Surgeon: Cristy Bonner DASEN, MD;  Location: [facility];  Service: Orthopedics;  Laterality: Left;   TONSILLECTOMY     TOTAL KNEE ARTHROPLASTY Left 02/14/2023   Procedure: TOTAL KNEE ARTHROPLASTY;  Surgeon: Edna Toribio LABOR, MD;  Location: [facility];  Service:  Orthopedics;  Laterality: Left;   TOTAL KNEE ARTHROPLASTY Right 07/02/2023   Procedure: TOTAL KNEE ARTHROPLASTY;  Surgeon: Edna Toribio LABOR, MD;  Location: [facility];  Service: Orthopedics;  Laterality: Right;   TUBAL LIGATION     Patient Active Problem List   Diagnosis Date Noted   Chronic right shoulder pain 02/08/2023   Lumbar degenerative disc disease 11/30/2021   Status post total shoulder arthroplasty, left 03/14/2021   Primary osteoarthritis of both knees 03/14/2021   REFERRING PROVIDER: Dorothyann Boas  REFERRING DIAG: Acute right shoulder bursitis, tendonitis.    THERAPY DIAG:  Chronic right shoulder pain  Other muscle spasm  Rationale for Evaluation and Treatment: Rehabilitation  ONSET DATE: 4-5 months.    SUBJECTIVE:  SUBJECTIVE STATEMENT: Pt reports minimal pain this morning.  PERTINENT HISTORY: Bilateral TKA's, left TSA.    PAIN:  Are you having pain? Yes: NPRS scale: 1/10.   Pain location: Right shoulder. Pain description: Ache, sore, throbbing, sharp.   Aggravating factors: As above.   Relieving factors: As above.    PRECAUTIONS: None  RED FLAGS: None   WEIGHT BEARING RESTRICTIONS: No  FALLS:  Has patient fallen in last 6 months? No  LIVING ENVIRONMENT: Lives in: House/apartment Has following equipment at home: None  OCCUPATION: Disabled.    PLOF: Independent  PATIENT GOALS: Use right shoulder without pain.    NEXT MD VISIT:   OBJECTIVE:  Note: Objective measures were completed at Evaluation unless otherwise noted.  DIAGNOSTIC FINDINGS:  06/01/24:  IMPRESSION: Mild insertional tendinopathy of the supraspinatus and infraspinatus tendons without tear.   Mild to moderate bursal fluid. Correlate for bursitis.   Mild acromioclavicular  osteoarthritis.  PATIENT SURVEYS:  Quick DASH:  34 points/52.27%   UPPER EXTREMITY ROM:   Active right shoulder antigravity flexion 120 degrees and full passive.  ER and behind back is full.    UPPER EXTREMITY MMT:  Right shoulder deltoid, IR/ER tested with right elbow by side is 4+/5.   SHOULDER SPECIAL TESTS: (-) right Drop Arm test. No significant pain reproduction with Impingement testing.   PALPATION:  Patient's CC is right shoulder anterior shoulder pain (where Band-Aid is).                                                                                                                               TREATMENT DATE:   07/17/24:                                    EXERCISE LOG  Exercise Repetitions and Resistance Comments  UBE 120 RPM's x 10 minutes   Pulleys 6 mins   Ball on Wall 3 min    RW4 Yellow band all motion performed to fatigue x 2   Standing bicep curls Yellow band performed to fatigue x 2   Standing Tricep extension Yellow band performed to fatigue x 2         PATIENT EDUCATION: Education details: RW24 Person educated: Patient. Education method: Demo Education comprehension: Very good.  HOME EXERCISE PROGRAM:   ASSESSMENT:  CLINICAL IMPRESSION: Pt arrives for today's treatment session reporting 1/10 right shoulder pain.  Pt states that her shoulder is feeling much better since getting the injection.  Pt introduced to ball on the wall exercises today with min cues required for posture and technique.  Pt declined STW today.  Pt denied any pain at completion of today's treatment session.   OBJECTIVE IMPAIRMENTS: decreased activity tolerance, decreased ROM, and pain.   ACTIVITY LIMITATIONS: carrying, lifting, and reach over head  PARTICIPATION LIMITATIONS: meal prep, cleaning, and laundry  PERSONAL FACTORS: Time since  onset of injury/illness/exacerbation are also affecting patient's functional outcome.   REHAB POTENTIAL: Good  CLINICAL DECISION  MAKING: Stable/uncomplicated  EVALUATION COMPLEXITY: Low   GOALS:  SHORT TERM GOALS: Target date: 08/06/24  Ind with a HEP Goal status: INITIAL  2.  Full antigravity right shoulder flexion.  Goal status: INITIAL  3.  Perform ADL's with pain not > 2-3/10.  Goal status: INITIAL  4.  Improve Quick DASH score by at least 5 points.  Goal status: INITIAL  PLAN:  PT FREQUENCY: 2x/week  PT DURATION: 4 weeks  PLANNED INTERVENTIONS: 97110-Therapeutic exercises, 97530- Therapeutic activity, [id number]- Neuromuscular re-education, 97535- Self Care, [id number]- Manual therapy, and Patient/Family education  PLAN FOR NEXT SESSION: STW/M, RW4, SDLY ER.  PRE's.   Delon DELENA Gosling, PTA 07/17/2024, 8:54 AM

## 2024-07-22 ENCOUNTER — Ambulatory Visit

## 2024-07-22 DIAGNOSIS — M25511 Pain in right shoulder: Secondary | ICD-10-CM | POA: Diagnosis not present

## 2024-07-22 DIAGNOSIS — G8929 Other chronic pain: Secondary | ICD-10-CM

## 2024-07-22 DIAGNOSIS — M62838 Other muscle spasm: Secondary | ICD-10-CM

## 2024-07-22 NOTE — Therapy (Signed)
 OUTPATIENT PHYSICAL THERAPY SHOULDER TREATMENT   Patient Name: Patricia Carrillo MRN: [id number] DOB:1966/07/08, 58 y.o., female Today's Date: 07/22/2024  END OF SESSION:  PT End of Session - 07/22/24 0804     Visit Number 4    Number of Visits 8    Date for Recertification  08/06/24    Authorization - Number of Visits 6     PT Start Time 0800    PT Stop Time 0846    PT Time Calculation (min) 46 min    Activity Tolerance Patient tolerated treatment well    Behavior During Therapy [facility] for tasks assessed/performed           Past Medical History:  Diagnosis Date   Anxiety    Arthritis    Chronic kidney disease    stage 3 per pt   Fatigue    Headache    History of kidney stones    Hypertension    Obesity 01/16/2022   takes metformin and ozempic for weight loss, not diabetic   Pre-diabetes    Sleep apnea    dx 15 years ago, never used CPAP   Past Surgical History:  Procedure Laterality Date   CHOLECYSTECTOMY     LAPAROSCOPIC HYSTERECTOMY     muiltiple knee arthroscopies bilateral      REVERSE SHOULDER ARTHROPLASTY Left 08/17/2022   Procedure: REVERSE SHOULDER ARTHROPLASTY;  Surgeon: Cristy Bonner DASEN, MD;  Location: [facility];  Service: Orthopedics;  Laterality: Left;   SHOULDER ARTHROSCOPY WITH DISTAL CLAVICLE RESECTION Left 01/26/2022   Procedure: SHOULDER ARTHROSCOPY WITH DISTAL CLAVICLE EXCISION/ DEBRIDEMENT;  Surgeon: Cristy Bonner DASEN, MD;  Location: [facility];  Service: Orthopedics;  Laterality: Left;   SHOULDER ARTHROSCOPY WITH SUBACROMIAL DECOMPRESSION, ROTATOR CUFF REPAIR AND BICEP TENDON REPAIR Left 01/26/2022   Procedure: SHOULDER ARTHROSCOPY WITH SUBACROMIAL DECOMPRESSION, ROTATOR CUFF REPAIR;  Surgeon: Cristy Bonner DASEN, MD;  Location: [facility];  Service: Orthopedics;  Laterality: Left;   TONSILLECTOMY     TOTAL KNEE ARTHROPLASTY Left 02/14/2023   Procedure: TOTAL KNEE ARTHROPLASTY;  Surgeon: Edna Toribio LABOR, MD;   Location: [facility];  Service: Orthopedics;  Laterality: Left;   TOTAL KNEE ARTHROPLASTY Right 07/02/2023   Procedure: TOTAL KNEE ARTHROPLASTY;  Surgeon: Edna Toribio LABOR, MD;  Location: [facility];  Service: Orthopedics;  Laterality: Right;   TUBAL LIGATION     Patient Active Problem List   Diagnosis Date Noted   Chronic right shoulder pain 02/08/2023   Lumbar degenerative disc disease 11/30/2021   Status post total shoulder arthroplasty, left 03/14/2021   Primary osteoarthritis of both knees 03/14/2021   REFERRING PROVIDER: Dorothyann Boas  REFERRING DIAG: Acute right shoulder bursitis, tendonitis.    THERAPY DIAG:  Chronic right shoulder pain  Other muscle spasm  Rationale for Evaluation and Treatment: Rehabilitation  ONSET DATE: 4-5 months.    SUBJECTIVE:  SUBJECTIVE STATEMENT: Pt denies any pain today.    PERTINENT HISTORY: Bilateral TKA's, left TSA.    PAIN:  Are you having pain? No  PRECAUTIONS: None  RED FLAGS: None   WEIGHT BEARING RESTRICTIONS: No  FALLS:  Has patient fallen in last 6 months? No  LIVING ENVIRONMENT: Lives in: House/apartment Has following equipment at home: None  OCCUPATION: Disabled.    PLOF: Independent  PATIENT GOALS: Use right shoulder without pain.    NEXT MD VISIT:   OBJECTIVE:  Note: Objective measures were completed at Evaluation unless otherwise noted.  DIAGNOSTIC FINDINGS:  06/01/24:  IMPRESSION: Mild insertional tendinopathy of the supraspinatus and infraspinatus tendons without tear.   Mild to moderate bursal fluid. Correlate for bursitis.   Mild acromioclavicular osteoarthritis.  PATIENT SURVEYS:  Quick DASH:  34 points/52.27%   UPPER EXTREMITY ROM:   Active right shoulder antigravity flexion 120 degrees and full passive.  ER  and behind back is full.    UPPER EXTREMITY MMT:  Right shoulder deltoid, IR/ER tested with right elbow by side is 4+/5.   SHOULDER SPECIAL TESTS: (-) right Drop Arm test. No significant pain reproduction with Impingement testing.   PALPATION:  Patient's CC is right shoulder anterior shoulder pain (where Band-Aid is).                                                                                                                               TREATMENT DATE:   07/22/24:                                    EXERCISE LOG  Exercise Repetitions and Resistance Comments  UBE 120 RPM's x 10 minutes   Pulleys 6 mins   Ball on Wall 3 min    RW4 Red band all directions to fatigue x 2   Standing bicep curls Red band performed to fatigue x 2   Standing Tricep extension Red band performed to fatigue x 2         PATIENT EDUCATION: Education details: RW4 Person educated: Patient. Education method: Demo Education comprehension: Very good.  HOME EXERCISE PROGRAM:   ASSESSMENT:  CLINICAL IMPRESSION: Pt arrives for today's treatment session reporting denying any pain.  Pt able to demonstrate full ROM with right shoulder in all directions today.  Pt also able to decrease QuickDASH score to 18.18%.  Pt has met all of the goals set forth by physical therapy at this time.  Pt plans to go on hold at this time and will call the facility after returning to the MD.  Pt denies any pain at completion of today's treatment session.  OBJECTIVE IMPAIRMENTS: decreased activity tolerance, decreased ROM, and pain.   ACTIVITY LIMITATIONS: carrying, lifting, and reach over head  PARTICIPATION LIMITATIONS: meal prep, cleaning, and laundry  PERSONAL FACTORS: Time since onset of injury/illness/exacerbation are also affecting patient's functional  outcome.   REHAB POTENTIAL: Good  CLINICAL DECISION MAKING: Stable/uncomplicated  EVALUATION COMPLEXITY: Low   GOALS:  SHORT TERM GOALS: Target date:  08/06/24  Ind with a HEP Goal status: MET  2.  Full antigravity right shoulder flexion.  Goal status: MET  3.  Perform ADL's with pain not > 2-3/10.   10/14: 1-2/10 Goal status: MET  4.  Improve Quick DASH score by at least 5 points.   10/14: 18.18% Goal status: MET  PLAN:  PT FREQUENCY: 2x/week  PT DURATION: 4 weeks  PLANNED INTERVENTIONS: 97110-Therapeutic exercises, 97530- Therapeutic activity, [id number]- Neuromuscular re-education, 97535- Self Care, 02859- Manual therapy, and Patient/Family education  PLAN FOR NEXT SESSION: STW/M, RW4, SDLY ER.  PRE's.   Delon DELENA Gosling, PTA 07/22/2024, 9:23 AM

## 2024-07-23 ENCOUNTER — Ambulatory Visit

## 2024-07-23 VITALS — BP 120/80 | Ht 61.0 in | Wt 195.0 lb

## 2024-07-23 DIAGNOSIS — M67951 Unspecified disorder of synovium and tendon, right thigh: Secondary | ICD-10-CM | POA: Diagnosis not present

## 2024-07-23 DIAGNOSIS — M25551 Pain in right hip: Secondary | ICD-10-CM

## 2024-07-23 DIAGNOSIS — M5136 Other intervertebral disc degeneration, lumbar region with discogenic back pain only: Secondary | ICD-10-CM | POA: Diagnosis not present

## 2024-07-23 NOTE — Progress Notes (Signed)
 Subjective:    Patient ID: Patricia Carrillo, female    DOB: 58 y.o., October 05, 1966   MRN: [id number]  Chief Complaint: Back pain  Discussed the use of AI scribe software for clinical note transcription with the patient, who gave verbal consent to proceed.  History of Present Illness Patricia Carrillo is a 58 year old female who presents with hip pain following a recent fall.  Right hip pain - Dull ache in the right hip, intensifying to severe pain when getting into bed - Pain onset following a fall caused by a dog pulling on her, resulting in a strain - Pain worsened the morning after the fall and has remained constant since - Pain does not radiate down the leg - No associated numbness or tingling - Pain sometimes disrupts sleep at night - Pain is not exacerbated by getting into or out of her car  Analgesic use and response - Uses a heating pad for symptomatic relief - Takes oxycodone , which she has been on for a couple of years - Oxycodone  dose increased to 15 mg - Current hip pain is not effectively managed by oxycodone   Review of pertinent imaging: MRI of the lumbar spine obtained on 01/21/2022 revealing small left foraminal to extraforaminal disc protrusion at L4-L5 which does contact the exiting L4 nerve root.  Degenerative disc osteophyte with facet hypertrophy at L5-S1 with resultant moderate left L5 foraminal stenosis.  Mild to moderate facet hypertrophy at L3-4 and L4-5.     Objective:   Vitals:   07/23/24 0917  BP: 120/80   Right hip (compared to normal) -Inspection: No swelling or skin changes. No leg length discrepancy. No gait abnormalities with walking. -Palpation: TTP - level ASIS, - level AIIS, - adductor, + greater trochanter, + SI joint, - over piriformis -AROM/PROM: Flexion 110 deg, abduction 30  deg, adduction 20  deg, extension 5 deg, ER 40 deg, IR 30  deg, low hamstring flexibility -Strength: 5/5 flexion, 5/5 abduction, 5/5 adduction, 5/5 extension, unable to  tolerate one legged squat  -Special tests: +  FADIR (localizing laterally), -  log roll, - Thomas, +Ober, - Noble, + FABER, - piriformis testing, - SLR, - hop test    Extracorporeal Shockwave Therapy Procedure Following the description of risks including pain, bruising, local skin irritation, damage to surrounding structures, patient provided verbal/written consent for ESWT procedure. Palpation was used to identify the right greater trochanter. Patient and probe was sterilely prepped in the usual fashion with alcohol .  Total strikes: 2000 Intensity: 10 Frequency: 12  Patient tolerated well without complication. Precautions provided.     Assessment & Plan:    Assessment & Plan Right greater trochanteric pain syndrome (hip bursitis/tendinopathy)   Acute exacerbation is likely due to a partial tear of the gluteus medius tendon after a recent incident with a dog pulling on a leash. Pain is localized to the right hip, with significant tenderness and pain during certain movements. Previously referred to as hip bursitis, the pain is now attributed to tendinopathy of the gluteus medius tendon. She reports significant pain, especially when getting into bed, and difficulty with certain movements. This condition is distinct from her chronic low back pain. Administer one session of shockwave therapy pro bono today. Recommend physical therapy to strengthen the gluteus medius tendon once pain subsides. Consider using Voltaren gel over the affected area for pain management. Prescribe an anti-inflammatory medication. Discuss prolotherapy, covered by insurance, as an alternative to more expensive treatments. Advise against starting physical therapy until  the acute pain subsides.  Chronic low back pain with degenerative changes and facet arthropathy   Chronic low back pain is associated with degenerative changes and facet arthropathy, as shown by a previous MRI indicating facet hypertrophy, degenerative changes,  and narrowing of the neuro foramina. Pain is mostly localized to the lower back on the right side, with occasional hip pain that can be more severe than the back pain. The recent exacerbation is likely due to irritation of the chronic condition rather than a new injury. Continue current pain management strategies.

## 2024-07-29 ENCOUNTER — Ambulatory Visit

## 2024-07-31 ENCOUNTER — Other Ambulatory Visit: Payer: Self-pay

## 2024-07-31 ENCOUNTER — Encounter: Payer: Self-pay | Admitting: Physical Therapy

## 2024-07-31 ENCOUNTER — Ambulatory Visit: Admitting: Physical Therapy

## 2024-07-31 DIAGNOSIS — M67951 Unspecified disorder of synovium and tendon, right thigh: Secondary | ICD-10-CM | POA: Insufficient documentation

## 2024-07-31 DIAGNOSIS — M62838 Other muscle spasm: Secondary | ICD-10-CM | POA: Diagnosis present

## 2024-07-31 DIAGNOSIS — M6281 Muscle weakness (generalized): Secondary | ICD-10-CM | POA: Diagnosis present

## 2024-07-31 DIAGNOSIS — M25651 Stiffness of right hip, not elsewhere classified: Secondary | ICD-10-CM | POA: Diagnosis present

## 2024-07-31 DIAGNOSIS — M25551 Pain in right hip: Secondary | ICD-10-CM | POA: Insufficient documentation

## 2024-07-31 NOTE — Therapy (Addendum)
 OUTPATIENT PHYSICAL THERAPY LOWER EXTREMITY EVALUATION   Patient Name: Bobbyjo Marulanda MRN: [id number] DOB:[birth year], 58 y.o., female Today's Date: 07/31/2024  END OF SESSION:  PT End of Session - 07/31/24 0848     Visit Number 1    Number of Visits 16    Date for Recertification  09/25/24    Authorization Type Medicaid    PT Start Time 0848    PT Stop Time 0930    PT Time Calculation (min) 42 min          Past Medical History:  Diagnosis Date   Anxiety    Arthritis    Chronic kidney disease    stage 3 per pt   Fatigue    Headache    History of kidney stones    Hypertension    Obesity 01/16/2022   takes metformin and ozempic for weight loss, not diabetic   Pre-diabetes    Sleep apnea    dx 15 years ago, never used CPAP   Past Surgical History:  Procedure Laterality Date   CHOLECYSTECTOMY     LAPAROSCOPIC HYSTERECTOMY     muiltiple knee arthroscopies bilateral      REVERSE SHOULDER ARTHROPLASTY Left 08/17/2022   Procedure: REVERSE SHOULDER ARTHROPLASTY;  Surgeon: Cristy Bonner DASEN, MD;  Location: [facility];  Service: Orthopedics;  Laterality: Left;   SHOULDER ARTHROSCOPY WITH DISTAL CLAVICLE RESECTION Left 01/26/2022   Procedure: SHOULDER ARTHROSCOPY WITH DISTAL CLAVICLE EXCISION/ DEBRIDEMENT;  Surgeon: Cristy Bonner DASEN, MD;  Location: [facility];  Service: Orthopedics;  Laterality: Left;   SHOULDER ARTHROSCOPY WITH SUBACROMIAL DECOMPRESSION, ROTATOR CUFF REPAIR AND BICEP TENDON REPAIR Left 01/26/2022   Procedure: SHOULDER ARTHROSCOPY WITH SUBACROMIAL DECOMPRESSION, ROTATOR CUFF REPAIR;  Surgeon: Cristy Bonner DASEN, MD;  Location: [facility];  Service: Orthopedics;  Laterality: Left;   TONSILLECTOMY     TOTAL KNEE ARTHROPLASTY Left 02/14/2023   Procedure: TOTAL KNEE ARTHROPLASTY;  Surgeon: Edna Toribio LABOR, MD;  Location: [facility];  Service: Orthopedics;  Laterality: Left;   TOTAL KNEE ARTHROPLASTY Right 07/02/2023   Procedure:  TOTAL KNEE ARTHROPLASTY;  Surgeon: Edna Toribio LABOR, MD;  Location: [facility];  Service: Orthopedics;  Laterality: Right;   TUBAL LIGATION     Patient Active Problem List   Diagnosis Date Noted   Chronic right shoulder pain 02/08/2023   Lumbar degenerative disc disease 11/30/2021   Status post total shoulder arthroplasty, left 03/14/2021   Primary osteoarthritis of both knees 03/14/2021    PCP: Teresa Aldona CROME, NP  REFERRING PROVIDER: Charles Redell LABOR, DO  REFERRING DIAG: M25.551 (ICD-10-CM) - Greater trochanteric pain syndrome of right lower extremity M67.951 (ICD-10-CM) - Tendinopathy of right gluteus medius  THERAPY DIAG:  Pain in right hip - Plan: PT plan of care cert/re-cert  Stiffness of right hip, not elsewhere classified - Plan: PT plan of care cert/re-cert  Other muscle spasm - Plan: PT plan of care cert/re-cert  Muscle weakness (generalized) - Plan: PT plan of care cert/re-cert  Rationale for Evaluation and Treatment: Rehabilitation  ONSET DATE: 4-5 months  SUBJECTIVE:   SUBJECTIVE STATEMENT: Pt reports back is an ongoing issue. Pt states she has degenerative disc disease -- sometimes it's good sometimes it's not. Pt states the hip issue is new. Doctor wanted her to try shockwave therapy which did help for 3-4 days but financially unable to continue it. Pt reports first noticed it while trying to get out of bed and it has slowly worsened. Has tried sleeping  with heating pad. Ice has been not as effective  PERTINENT HISTORY: Bilat knee replacement, rotator cuff, shoulder replacement, high blood pressure  PAIN:  Are you having pain? Yes: NPRS scale: 4 or 5 at rest; at worst 8/10  Pain location: R lateral hip Pain description: sometimes sharp; constant soreness/throbbing Aggravating factors: Getting in/out of bed Relieving factors: extra pain medicine  PRECAUTIONS: None  RED FLAGS: None   WEIGHT BEARING RESTRICTIONS: No  FALLS:  Has patient fallen in last  6 months? 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  DIAGNOSTIC FINDINGS: No recent imaging seen for hip  PATIENT SURVEYS:  LEFS  Extreme difficulty/unable (0), Quite a bit of difficulty (1), Moderate difficulty (2), Little difficulty (3), No difficulty (4) Survey date:  10/23/252  Any of your usual work, housework or school activities 2  2. Usual hobbies, recreational or sporting activities 1  3. Getting into/out of the bath 3  4. Walking between rooms 2  5. Putting on socks/shoes 1  6. Squatting  2  7. Lifting an object, like a bag of groceries from the floor 3  8. Performing light activities around your home 3  9. Performing heavy activities around your home 3  10. Getting into/out of a car 4  11. Walking 2 blocks 3  12. Walking 1 mile 3  13. Going up/down 10 stairs (1 flight) 4  14. Standing for 1 hour 4  15.  sitting for 1 hour 2  16. Running on even ground 2  17. Running on uneven ground 2  18. Making sharp turns while running fast 2  19. Hopping  2  20. Rolling over in bed 2  Score total:  50/80     COGNITION: Overall cognitive status: Within functional limits for tasks assessed     SENSATION: WFL  EDEMA:  None  MUSCLE LENGTH: Hamstrings: WNL bilat  POSTURE: No Significant postural limitations  PALPATION: TTP R glute med  LOWER EXTREMITY ROM: R hip IR more limited than L   LOWER EXTREMITY MMT:  MMT Right eval Left eval  Hip flexion 3+ 3+  Hip extension 4 4  Hip abduction 3+ 3+  Hip adduction     Hip internal rotation 4- 5  Hip external rotation 3+ 4  Knee flexion 5 5  Knee extension 5 5  Ankle dorsiflexion    Ankle plantarflexion    Ankle inversion    Ankle eversion     (Blank rows = not tested)  LOWER EXTREMITY SPECIAL TESTS:  Hip special tests: Belvie (FABER) test: negative  FUNCTIONAL TESTS:  Did not assess  GAIT: Distance walked: Into clinic Assistive device utilized: None Level of assistance: Complete Independence Comments: Hip instability/trendelenburg pattern notable  TREATMENT DATE: 07/31/24 See HEP below    PATIENT EDUCATION:  Education details: Exam findings, POC, initial HEP/options; discussed sleep positioning/using pillow between knees to decrease valgus stress on R lateral hip Person educated: Patient Education method: Explanation, Demonstration, and Handouts Education comprehension: verbalized understanding, returned demonstration, and needs further education  HOME EXERCISE PROGRAM: Access Code: [facility] URL: [email or web address] Date: 07/31/2024 Prepared by: Corbitt Cloke April Earnie Starring  Exercises - Supine Piriformis Stretch with Foot on Ground  - 1 x daily - 7 x weekly - 2 sets - 30 sec hold - Supine Bilateral Hip Internal Rotation Stretch  - 1 x daily - 7 x weekly - 2 sets - 10 reps - Bent Knee Fallouts  - 1 x daily - 7 x weekly - 2 sets - 10 reps  ASSESSMENT:  CLINICAL IMPRESSION: Patient is a 58 y.o. F who was seen today for physical therapy evaluation and treatment for R hip pain. Assessment is significant for glute med trigger points and weakness as well as R>L hip hypomobility. S/S appear consistent with glute med tendinopathy causing greater trochanteric bursitis limiting bed mobility/transfers. Pt will benefit from PT to address these deficits for return to PLOF.   OBJECTIVE IMPAIRMENTS:  Abnormal gait, decreased endurance, decreased mobility, difficulty walking, decreased strength, hypomobility, increased fascial restrictions, increased muscle spasms, improper body mechanics, postural dysfunction, and pain.   ACTIVITY LIMITATIONS: standing, sleeping, transfers, bed mobility, and locomotion level  PARTICIPATION LIMITATIONS: meal prep, cleaning, laundry, and community activity  PERSONAL FACTORS: Age, Fitness, Past/current experiences, and Time since onset of injury/illness/exacerbation are also affecting patient's functional outcome.   REHAB POTENTIAL: Good  CLINICAL DECISION MAKING: Evolving/moderate complexity  EVALUATION COMPLEXITY: Moderate   GOALS: Goals reviewed with patient? Yes  SHORT TERM GOALS: Target date: 08/28/2024  Pt will be ind with initial HEP Baseline: Goal status: INITIAL  2.  Pt will report improved pain by >/=25% Baseline:  Goal status: INITIAL   LONG TERM GOALS: Target date: 09/25/2024   Pt will be ind with management and progression of HEP Baseline:  Goal status: INITIAL  2.  Pt will have R = L hip IR/ER AROM to demo decreased hip muscle tightness Baseline:  Goal status: INITIAL  3.  Pt will demo at least 4/5 hip strength Baseline:  Goal status: INITIAL  4.  Pt will have increased LEFS to >=62/80 to demo MCID Baseline: 50 Goal status: INITIAL     PLAN:  PT FREQUENCY: 1-2x/week  PT DURATION: 8 weeks  PLANNED INTERVENTIONS: 97164- PT Re-evaluation, 97750- Physical Performance Testing, 97110-Therapeutic exercises, 97530- Therapeutic activity, 97112- Neuromuscular re-education, 97535- Self Care, 02859- Manual therapy, 97116- Gait training, [phone] (1-2 muscles), 20561 (3+ muscles)- Dry Needling, Patient/Family education, Balance training, Stair training, Taping, Joint mobilization, and Spinal mobilization  ** [employer] Medicaid doesn't cover vaso, e-stim, U/S, traction **  PLAN FOR NEXT SESSION: Assess response to HEP.  Manual therapy as indicated for glute med. Stretch hips/glutes. Strengthen hips/core.    Maday Guarino April Ma L Beyounce Dickens, PT, DPT 07/31/2024, 3:13 PM

## 2024-08-05 ENCOUNTER — Ambulatory Visit

## 2024-08-05 DIAGNOSIS — M6281 Muscle weakness (generalized): Secondary | ICD-10-CM

## 2024-08-05 DIAGNOSIS — M62838 Other muscle spasm: Secondary | ICD-10-CM

## 2024-08-05 DIAGNOSIS — M25551 Pain in right hip: Secondary | ICD-10-CM

## 2024-08-05 DIAGNOSIS — M25651 Stiffness of right hip, not elsewhere classified: Secondary | ICD-10-CM

## 2024-08-05 NOTE — Therapy (Signed)
 OUTPATIENT PHYSICAL THERAPY LOWER EXTREMITY TREATMENT   Patient Name: Patricia Carrillo MRN: [id number] DOB:Jun 14, 1966, 58 y.o., female Today's Date: 08/05/2024  END OF SESSION:  PT End of Session - 08/05/24 0847     Visit Number 2    Number of Visits 16    Date for Recertification  09/25/24    Authorization Type Medicaid    PT Start Time 0845    PT Stop Time 0933    PT Time Calculation (min) 48 min          Past Medical History:  Diagnosis Date   Anxiety    Arthritis    Chronic kidney disease    stage 3 per pt   Fatigue    Headache    History of kidney stones    Hypertension    Obesity 01/16/2022   takes metformin and ozempic for weight loss, not diabetic   Pre-diabetes    Sleep apnea    dx 15 years ago, never used CPAP   Past Surgical History:  Procedure Laterality Date   CHOLECYSTECTOMY     LAPAROSCOPIC HYSTERECTOMY     muiltiple knee arthroscopies bilateral      REVERSE SHOULDER ARTHROPLASTY Left 08/17/2022   Procedure: REVERSE SHOULDER ARTHROPLASTY;  Surgeon: Cristy Bonner DASEN, MD;  Location: [facility];  Service: Orthopedics;  Laterality: Left;   SHOULDER ARTHROSCOPY WITH DISTAL CLAVICLE RESECTION Left 01/26/2022   Procedure: SHOULDER ARTHROSCOPY WITH DISTAL CLAVICLE EXCISION/ DEBRIDEMENT;  Surgeon: Cristy Bonner DASEN, MD;  Location: [facility];  Service: Orthopedics;  Laterality: Left;   SHOULDER ARTHROSCOPY WITH SUBACROMIAL DECOMPRESSION, ROTATOR CUFF REPAIR AND BICEP TENDON REPAIR Left 01/26/2022   Procedure: SHOULDER ARTHROSCOPY WITH SUBACROMIAL DECOMPRESSION, ROTATOR CUFF REPAIR;  Surgeon: Cristy Bonner DASEN, MD;  Location: [facility];  Service: Orthopedics;  Laterality: Left;   TONSILLECTOMY     TOTAL KNEE ARTHROPLASTY Left 02/14/2023   Procedure: TOTAL KNEE ARTHROPLASTY;  Surgeon: Edna Toribio LABOR, MD;  Location: [facility];  Service: Orthopedics;  Laterality: Left;   TOTAL KNEE ARTHROPLASTY Right 07/02/2023   Procedure:  TOTAL KNEE ARTHROPLASTY;  Surgeon: Edna Toribio LABOR, MD;  Location: [facility];  Service: Orthopedics;  Laterality: Right;   TUBAL LIGATION     Patient Active Problem List   Diagnosis Date Noted   Chronic right shoulder pain 02/08/2023   Lumbar degenerative disc disease 11/30/2021   Status post total shoulder arthroplasty, left 03/14/2021   Primary osteoarthritis of both knees 03/14/2021    PCP: Teresa Aldona CROME, NP  REFERRING PROVIDER: Teresa Aldona CROME, NP  REFERRING DIAG: M25.551 (ICD-10-CM) - Greater trochanteric pain syndrome of right lower extremity M67.951 (ICD-10-CM) - Tendinopathy of right gluteus medius  THERAPY DIAG:  Pain in right hip  Stiffness of right hip, not elsewhere classified  Other muscle spasm  Muscle weakness (generalized)  Rationale for Evaluation and Treatment: Rehabilitation  ONSET DATE: 4-5 months  SUBJECTIVE:   SUBJECTIVE STATEMENT: Pt reports 8/10 right hip and low back pain today.  Pt states that she fell Saturday afternoon trying to break up a dog fight.  PERTINENT HISTORY: Bilat knee replacement, rotator cuff, shoulder replacement, high blood pressure  PAIN:  Are you having pain? Yes: NPRS scale: 8/10 Pain location: R lateral hip Pain description: sometimes sharp; constant soreness/throbbing Aggravating factors: Getting in/out of bed Relieving factors: extra pain medicine  PRECAUTIONS: None  RED FLAGS: None   WEIGHT BEARING RESTRICTIONS: No  FALLS:  Has patient fallen in last 6 months? 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  DIAGNOSTIC FINDINGS: No recent imaging seen for hip  PATIENT SURVEYS:  LEFS  Extreme difficulty/unable (0), Quite a bit of difficulty (1), Moderate difficulty (2), Little difficulty (3), No difficulty (4) Survey date:  10/23/252  Any of your usual work, housework or school activities 2  2. Usual hobbies, recreational or sporting activities 1  3. Getting into/out of the bath 3  4. Walking between rooms 2  5. Putting on socks/shoes 1  6. Squatting  2  7. Lifting an object, like a bag of groceries from the floor 3  8. Performing light activities around your home 3  9. Performing heavy activities around your home 3  10. Getting into/out of a car 4  11. Walking 2 blocks 3  12. Walking 1 mile 3  13. Going up/down 10 stairs (1 flight) 4  14. Standing for 1 hour 4  15.  sitting for 1 hour 2  16. Running on even ground 2  17. Running on uneven ground 2  18. Making sharp turns while running fast 2  19. Hopping  2  20. Rolling over in bed 2  Score total:  50/80     COGNITION: Overall cognitive status: Within functional limits for tasks assessed     SENSATION: WFL  EDEMA:  None  MUSCLE LENGTH: Hamstrings: WNL bilat  POSTURE: No Significant postural limitations  PALPATION: TTP R glute med  LOWER EXTREMITY ROM: R hip IR more limited than L   LOWER EXTREMITY MMT:  MMT Right eval Left eval  Hip flexion 3+ 3+  Hip extension 4 4  Hip abduction 3+ 3+  Hip adduction    Hip internal rotation 4- 5  Hip external rotation 3+ 4  Knee flexion 5 5  Knee extension 5 5  Ankle dorsiflexion    Ankle plantarflexion    Ankle inversion    Ankle eversion     (Blank rows = not tested)  LOWER EXTREMITY SPECIAL TESTS:  Hip special tests: Belvie (FABER) test: negative  FUNCTIONAL TESTS:  Did not assess  GAIT: Distance walked: Into clinic Assistive device utilized: None Level of assistance: Complete Independence Comments:  Hip instability/trendelenburg pattern notable                                                                                                                                TREATMENT DATE:   08/05/24  EXERCISE LOG  Exercise Repetitions and Resistance Comments  Nustep Lvl 3 x 15 mins   SKTC 10 reps with 10 sec hold bil   LTR 10 reps bil   Supine Ball Squeeze 3 mins   Bridge with [employer] 2 sets of 10 reps   Supine Clam Shells Red x 3 mins   SL Hip Abduction 2 sets of 10 reps     Blank cell = exercise not performed today   Manual Therapy Soft Tissue Mobilization: Right hip, STW/M to right hip musculature to decrease pain and tone with pt in left side-lying for comfort   07/31/24 See HEP below    PATIENT EDUCATION:  Education details: Exam findings, POC, initial HEP/options; discussed sleep positioning/using pillow between knees to decrease valgus stress on R lateral hip Person educated: Patient Education method: Explanation, Demonstration, and Handouts Education comprehension: verbalized understanding, returned demonstration, and needs further education  HOME EXERCISE PROGRAM: Access Code: [facility] URL: [email or web address] Date: 07/31/2024 Prepared by: Gellen April Earnie Starring  Exercises - Supine Piriformis Stretch with Foot on Ground  - 1 x daily - 7 x weekly - 2 sets - 30 sec hold - Supine Bilateral Hip Internal Rotation Stretch  - 1 x daily - 7 x weekly - 2 sets - 10 reps - Bent Knee Fallouts  - 1 x daily - 7 x weekly - 2 sets - 10 reps  ASSESSMENT:  CLINICAL IMPRESSION: Pt arrives for today's treatment session reporting 8/10 right hip pain.  Reviewed HEP with minimal cues required for technique and hold time.  Pt introduced to supine and side-lying right hip strengthening exercises with min cues for proper technique.  Pt challenged by supine bridge with ball squeeze.  STW/M performed to right hip musculature to decrease pain  and tone.  Pt reported 5/10 right hip pain at completion of today's treatment session.    OBJECTIVE IMPAIRMENTS: Abnormal gait, decreased endurance, decreased mobility, difficulty walking, decreased strength, hypomobility, increased fascial restrictions, increased muscle spasms, improper body mechanics, postural dysfunction, and pain.   ACTIVITY LIMITATIONS: standing, sleeping, transfers, bed mobility, and locomotion level  PARTICIPATION LIMITATIONS: meal prep, cleaning, laundry, and community activity  PERSONAL FACTORS: Age, Fitness, Past/current experiences, and Time since onset of injury/illness/exacerbation are also affecting patient's functional outcome.   REHAB POTENTIAL: Good  CLINICAL DECISION MAKING: Evolving/moderate complexity  EVALUATION COMPLEXITY: Moderate   GOALS: Goals reviewed with patient? Yes  SHORT TERM GOALS: Target date: 08/28/2024  Pt will be ind with initial HEP Baseline: Goal status: INITIAL  2.  Pt will report improved pain by >/=25% Baseline:  Goal status: INITIAL   LONG TERM GOALS: Target date: 09/25/2024   Pt will be ind with management and progression of HEP Baseline:  Goal status: INITIAL  2.  Pt will have R = L hip IR/ER AROM to demo decreased hip muscle tightness Baseline:  Goal status: INITIAL  3.  Pt will demo at least 4/5 hip strength Baseline:  Goal status: INITIAL  4.  Pt will have increased LEFS to >=62/80 to demo MCID Baseline: 50 Goal status: INITIAL     PLAN:  PT FREQUENCY: 1-2x/week  PT DURATION: 8 weeks  PLANNED INTERVENTIONS: 97164- PT Re-evaluation, 97750- Physical Performance Testing, 97110-Therapeutic exercises, 97530- Therapeutic activity, 97112- Neuromuscular re-education, 97535- Self Care, 02859- Manual therapy, 97116- Gait training, [phone] (1-2 muscles), 20561 (3+ muscles)- Dry Needling, Patient/Family education, Balance training, Stair training, Taping, Joint mobilization, and Spinal mobilization  **  Healthy [employer] Medicaid doesn't cover vaso, e-stim,  U/S, traction **  PLAN FOR NEXT SESSION: Assess response to HEP. Manual therapy as indicated for glute med. Stretch hips/glutes. Strengthen hips/core.    Delon DELENA Gosling, PTA, DPT 08/05/2024, 10:47 AM

## 2024-08-07 ENCOUNTER — Ambulatory Visit

## 2024-08-07 DIAGNOSIS — M25551 Pain in right hip: Secondary | ICD-10-CM

## 2024-08-07 DIAGNOSIS — M25651 Stiffness of right hip, not elsewhere classified: Secondary | ICD-10-CM

## 2024-08-07 DIAGNOSIS — M62838 Other muscle spasm: Secondary | ICD-10-CM

## 2024-08-07 DIAGNOSIS — M6281 Muscle weakness (generalized): Secondary | ICD-10-CM

## 2024-08-07 NOTE — Therapy (Signed)
 OUTPATIENT PHYSICAL THERAPY LOWER EXTREMITY TREATMENT   Patient Name: Patricia Carrillo MRN: [id number] DOB:24-Jan-1966, 58 y.o., female Today's Date: 08/07/2024  END OF SESSION:  PT End of Session - 08/07/24 0855     Visit Number 3    Number of Visits 16    Date for Recertification  09/25/24    Authorization Type Medicaid    Authorization - Number of Visits 6     PT Start Time 0845          Past Medical History:  Diagnosis Date   Anxiety    Arthritis    Chronic kidney disease    stage 3 per pt   Fatigue    Headache    History of kidney stones    Hypertension    Obesity 01/16/2022   takes metformin and ozempic for weight loss, not diabetic   Pre-diabetes    Sleep apnea    dx 15 years ago, never used CPAP   Past Surgical History:  Procedure Laterality Date   CHOLECYSTECTOMY     LAPAROSCOPIC HYSTERECTOMY     muiltiple knee arthroscopies bilateral      REVERSE SHOULDER ARTHROPLASTY Left 08/17/2022   Procedure: REVERSE SHOULDER ARTHROPLASTY;  Surgeon: Cristy Bonner DASEN, MD;  Location: [facility];  Service: Orthopedics;  Laterality: Left;   SHOULDER ARTHROSCOPY WITH DISTAL CLAVICLE RESECTION Left 01/26/2022   Procedure: SHOULDER ARTHROSCOPY WITH DISTAL CLAVICLE EXCISION/ DEBRIDEMENT;  Surgeon: Cristy Bonner DASEN, MD;  Location: [facility];  Service: Orthopedics;  Laterality: Left;   SHOULDER ARTHROSCOPY WITH SUBACROMIAL DECOMPRESSION, ROTATOR CUFF REPAIR AND BICEP TENDON REPAIR Left 01/26/2022   Procedure: SHOULDER ARTHROSCOPY WITH SUBACROMIAL DECOMPRESSION, ROTATOR CUFF REPAIR;  Surgeon: Cristy Bonner DASEN, MD;  Location:  [facility];  Service: Orthopedics;  Laterality: Left;   TONSILLECTOMY     TOTAL KNEE ARTHROPLASTY Left 02/14/2023   Procedure: TOTAL KNEE ARTHROPLASTY;  Surgeon: Edna Toribio LABOR, MD;  Location: [facility];  Service: Orthopedics;  Laterality: Left;   TOTAL KNEE ARTHROPLASTY Right 07/02/2023   Procedure: TOTAL KNEE  ARTHROPLASTY;  Surgeon: Edna Toribio LABOR, MD;  Location: [facility];  Service: Orthopedics;  Laterality: Right;   TUBAL LIGATION     Patient Active Problem List   Diagnosis Date Noted   Chronic right shoulder pain 02/08/2023   Lumbar degenerative disc disease 11/30/2021   Status post total shoulder arthroplasty, left 03/14/2021   Primary osteoarthritis of both knees 03/14/2021    PCP: Teresa Aldona CROME, NP  REFERRING PROVIDER: Teresa Aldona CROME, NP  REFERRING DIAG: M25.551 (ICD-10-CM) - Greater trochanteric pain syndrome of right lower extremity M67.951 (ICD-10-CM) - Tendinopathy of right gluteus medius  THERAPY DIAG:  Pain in right hip  Stiffness of right hip, not elsewhere classified  Other muscle spasm  Muscle weakness (generalized)  Rationale for Evaluation and Treatment: Rehabilitation  ONSET DATE: 4-5 months  SUBJECTIVE:   SUBJECTIVE STATEMENT: Pt reports 7/10 right hip and low back pain today.  Pt reports feeling sore after last treatment session.  PERTINENT HISTORY: Bilat knee replacement, rotator cuff, shoulder replacement, high blood pressure  PAIN:  Are you having pain? Yes: NPRS scale: 8/10 Pain location: R lateral hip Pain description: sometimes sharp; constant soreness/throbbing Aggravating factors: Getting in/out of bed Relieving factors: extra pain medicine  PRECAUTIONS: None  RED FLAGS: None   WEIGHT BEARING RESTRICTIONS: No  FALLS:  Has patient fallen in last 6 months? 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  DIAGNOSTIC FINDINGS: No recent imaging seen for hip  PATIENT SURVEYS:  LEFS  Extreme difficulty/unable (0), Quite a bit of difficulty (1), Moderate difficulty (2), Little difficulty (3), No difficulty (4) Survey date:  10/23/252  Any of your usual work, housework or school activities 2  2. Usual hobbies, recreational or sporting activities 1  3. Getting into/out of the bath 3  4. Walking between rooms 2  5. Putting on socks/shoes 1  6. Squatting  2  7. Lifting an object, like a bag of groceries from the floor 3  8. Performing light activities around your home 3  9. Performing heavy activities around your home 3  10. Getting into/out of a car 4  11. Walking 2 blocks 3  12. Walking 1 mile 3  13. Going up/down 10 stairs (1 flight) 4  14. Standing for 1 hour 4  15.  sitting for 1 hour 2  16. Running on even ground 2  17. Running on uneven ground 2  18. Making sharp turns while running fast 2  19. Hopping  2  20. Rolling over in bed 2  Score total:  50/80     COGNITION: Overall cognitive status: Within functional limits for tasks assessed     SENSATION: WFL  EDEMA:  None  MUSCLE LENGTH: Hamstrings: WNL bilat  POSTURE: No Significant postural limitations  PALPATION: TTP R glute med  LOWER EXTREMITY ROM: R hip IR more limited than L   LOWER EXTREMITY MMT:  MMT Right eval Left eval  Hip flexion 3+ 3+  Hip extension 4 4  Hip abduction 3+ 3+  Hip adduction    Hip internal rotation 4- 5  Hip external rotation 3+ 4  Knee flexion 5 5  Knee extension 5 5  Ankle dorsiflexion    Ankle plantarflexion    Ankle inversion    Ankle eversion     (Blank rows = not tested)  LOWER EXTREMITY SPECIAL TESTS:  Hip special tests: Belvie (FABER) test: negative  FUNCTIONAL TESTS:  Did not assess  GAIT: Distance walked: Into clinic Assistive device utilized: None Level of assistance: Complete Independence Comments: Hip instability/trendelenburg  pattern notable                                                                                                                                TREATMENT DATE:   08/07/24  EXERCISE LOG  Exercise Repetitions and Resistance Comments  Nustep Lvl 3 x 15 mins   Rockerboard 4 mins   Lunges 4 mins   SKTC    LTR    Supine Ball Squeeze 3 mins   Bridge with [employer] 2 sets of 10 reps   Supine Clam Shells Red x 3 mins   SL Hip Abduction 2 sets of 10 reps     Blank cell = exercise not performed today   Manual Therapy Soft Tissue Mobilization: Right hip, STW/M to right hip musculature to decrease pain and tone with pt in left side-lying for comfort   07/31/24 See HEP below    PATIENT EDUCATION:  Education details: Exam findings, POC, initial HEP/options; discussed sleep positioning/using pillow between knees to decrease valgus stress on R lateral hip Person educated: Patient Education method: Explanation, Demonstration, and Handouts Education comprehension: verbalized understanding, returned demonstration, and needs further education  HOME EXERCISE PROGRAM: Access Code: [facility] URL: [email or web address] Date: 07/31/2024 Prepared by: Gellen April Earnie Starring  Exercises - Supine Piriformis Stretch with Foot on Ground  - 1 x daily - 7 x weekly - 2 sets - 30 sec hold - Supine Bilateral Hip Internal Rotation Stretch  - 1 x daily - 7 x weekly - 2 sets - 10 reps - Bent Knee Fallouts  - 1 x daily - 7 x weekly - 2 sets - 10 reps  ASSESSMENT:  CLINICAL IMPRESSION: Pt arrives for today's treatment session reporting 7/10 right hip pain.  Pt introduced to standing lunges to stretch lateral and posterior hip musculature.  Pt with continued 10/10 tenderness over right greater trochanter in bursa region.  Pt plans to buy TENS unit over the weekend and will bring it to her next appointment. STW/M performed to right hip with minimal decrease in pain.  Pt  reported minimal decrease in pain at completion of today's treatment session.  OBJECTIVE IMPAIRMENTS: Abnormal gait, decreased endurance, decreased mobility, difficulty walking, decreased strength, hypomobility, increased fascial restrictions, increased muscle spasms, improper body mechanics, postural dysfunction, and pain.   ACTIVITY LIMITATIONS: standing, sleeping, transfers, bed mobility, and locomotion level  PARTICIPATION LIMITATIONS: meal prep, cleaning, laundry, and community activity  PERSONAL FACTORS: Age, Fitness, Past/current experiences, and Time since onset of injury/illness/exacerbation are also affecting patient's functional outcome.   REHAB POTENTIAL: Good  CLINICAL DECISION MAKING: Evolving/moderate complexity  EVALUATION COMPLEXITY: Moderate   GOALS: Goals reviewed with patient? Yes  SHORT TERM GOALS: Target date: 08/28/2024  Pt will be ind with initial HEP Baseline: Goal status: INITIAL  2.  Pt will report improved pain by >/=25% Baseline:  Goal status: INITIAL   LONG TERM GOALS: Target date: 09/25/2024   Pt will be ind with management and progression of HEP Baseline:  Goal status: INITIAL  2.  Pt will have R = L hip IR/ER AROM to demo decreased hip muscle tightness Baseline:  Goal status: INITIAL  3.  Pt will demo at least 4/5 hip strength Baseline:  Goal status: INITIAL  4.  Pt will have increased LEFS to >=62/80 to demo MCID Baseline: 50 Goal status: INITIAL     PLAN:  PT FREQUENCY: 1-2x/week  PT DURATION: 8 weeks  PLANNED INTERVENTIONS: 97164- PT Re-evaluation, 97750- Physical Performance Testing, 97110-Therapeutic exercises, 97530- Therapeutic activity, 97112- Neuromuscular re-education, 97535- Self Care, 02859- Manual therapy, 97116- Gait training, [phone] (1-2 muscles), 20561 (3+ muscles)- Dry Needling, Patient/Family education, Balance training, Stair training, Taping, Joint mobilization, and Spinal mobilization  ** [facility]  doesn't cover vaso, e-stim, U/S, traction **  PLAN FOR NEXT SESSION: Assess response to HEP. Manual therapy as indicated for glute med. Stretch hips/glutes. Strengthen hips/core.    Delon DELENA Gosling, PTA 08/07/2024, 9:46 AM

## 2024-08-12 ENCOUNTER — Ambulatory Visit: Admitting: *Deleted

## 2024-08-12 ENCOUNTER — Encounter: Payer: Self-pay | Admitting: *Deleted

## 2024-08-12 DIAGNOSIS — M62838 Other muscle spasm: Secondary | ICD-10-CM | POA: Diagnosis present

## 2024-08-12 DIAGNOSIS — M6281 Muscle weakness (generalized): Secondary | ICD-10-CM | POA: Diagnosis present

## 2024-08-12 DIAGNOSIS — M25651 Stiffness of right hip, not elsewhere classified: Secondary | ICD-10-CM | POA: Insufficient documentation

## 2024-08-12 DIAGNOSIS — M25551 Pain in right hip: Secondary | ICD-10-CM | POA: Diagnosis present

## 2024-08-12 NOTE — Therapy (Signed)
 OUTPATIENT PHYSICAL THERAPY LOWER EXTREMITY TREATMENT   Patient Name: Patricia Carrillo MRN: [id number] DOB:04/15/1966, 58 y.o., female Today's Date: 08/12/2024  END OF SESSION:  PT End of Session - 08/12/24 0807     Visit Number 4    Number of Visits 16    Date for Recertification  09/25/24    Authorization Type Medicaid    Authorization - Number of Visits 6    PT Start Time 0800    PT Stop Time 0050    PT Time Calculation (min) 1010 min          Past Medical History:  Diagnosis Date   Anxiety    Arthritis    Chronic kidney disease    stage 3 per pt   Fatigue    Headache    History of kidney stones    Hypertension    Obesity 01/16/2022   takes metformin and ozempic for weight loss, not diabetic   Pre-diabetes    Sleep apnea    dx 15 years ago, never used CPAP   Past Surgical History:  Procedure Laterality Date   CHOLECYSTECTOMY     LAPAROSCOPIC HYSTERECTOMY     muiltiple knee arthroscopies bilateral      REVERSE SHOULDER ARTHROPLASTY Left 08/17/2022   Procedure: REVERSE SHOULDER ARTHROPLASTY;  Surgeon: Cristy Bonner DASEN, MD;  Location: [facility];  Service: Orthopedics;  Laterality: Left;   SHOULDER ARTHROSCOPY WITH DISTAL CLAVICLE RESECTION Left 01/26/2022   Procedure: SHOULDER ARTHROSCOPY WITH DISTAL CLAVICLE EXCISION/ DEBRIDEMENT;  Surgeon: Cristy Bonner DASEN, MD;  Location: [facility];  Service: Orthopedics;  Laterality: Left;   SHOULDER ARTHROSCOPY WITH SUBACROMIAL DECOMPRESSION, ROTATOR CUFF REPAIR AND BICEP TENDON REPAIR Left 01/26/2022   Procedure: SHOULDER ARTHROSCOPY WITH SUBACROMIAL DECOMPRESSION, ROTATOR CUFF REPAIR;  Surgeon: Cristy Bonner DASEN, MD;  Location: [facility];  Service: Orthopedics;  Laterality: Left;   TONSILLECTOMY     TOTAL KNEE ARTHROPLASTY Left 02/14/2023   Procedure: TOTAL KNEE ARTHROPLASTY;  Surgeon: Edna Toribio LABOR, MD;  Location: [facility];  Service: Orthopedics;  Laterality: Left;   TOTAL KNEE  ARTHROPLASTY Right 07/02/2023   Procedure: TOTAL KNEE ARTHROPLASTY;  Surgeon: Edna Toribio LABOR, MD;  Location: [facility];  Service: Orthopedics;  Laterality: Right;   TUBAL LIGATION     Patient Active Problem List   Diagnosis Date Noted   Chronic right shoulder pain 02/08/2023   Lumbar degenerative disc disease 11/30/2021   Status post total shoulder arthroplasty, left 03/14/2021   Primary osteoarthritis of both knees 03/14/2021    PCP: Teresa Aldona CROME, NP  REFERRING PROVIDER: Teresa Aldona CROME, NP  REFERRING DIAG: M25.551 (ICD-10-CM) - Greater trochanteric pain syndrome of right lower extremity M67.951 (ICD-10-CM) - Tendinopathy of right gluteus medius  THERAPY DIAG:  Pain in right hip  Stiffness of right hip, not elsewhere classified  Rationale for Evaluation and Treatment: Rehabilitation  ONSET DATE: 4-5 months  SUBJECTIVE:   SUBJECTIVE STATEMENT: Pt reports 7/10 right hip and low back pain today.  Pt reports feeling sore after last treatment session.To MD 08-19-24, Injection?  PERTINENT HISTORY: Bilat knee replacement, rotator cuff, shoulder replacement, high blood pressure  PAIN:  Are you having pain? Yes: NPRS scale: 8/10 Pain location: R lateral hip Pain description: sometimes sharp; constant soreness/throbbing Aggravating factors: Getting in/out of bed Relieving factors: extra pain medicine  PRECAUTIONS: None  RED FLAGS: None   WEIGHT BEARING RESTRICTIONS: No  FALLS:  Has patient fallen in last 6 months? 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  DIAGNOSTIC FINDINGS: No recent imaging seen for hip  PATIENT SURVEYS:  LEFS  Extreme difficulty/unable (0), Quite a bit of difficulty (1), Moderate difficulty (2), Little difficulty (3), No difficulty (4) Survey date:  10/23/252  Any of your usual work, housework or school activities 2  2. Usual hobbies, recreational or sporting activities 1  3. Getting into/out of the bath 3  4. Walking between rooms 2  5. Putting on socks/shoes 1  6. Squatting  2  7. Lifting an object, like a bag of groceries from the floor 3  8. Performing light activities around your home 3  9. Performing heavy activities around your home 3  10. Getting into/out of a car 4  11. Walking 2 blocks 3  12. Walking 1 mile 3  13. Going up/down 10 stairs (1 flight) 4  14. Standing for 1 hour 4  15.  sitting for 1 hour 2  16. Running on even ground 2  17. Running on uneven ground 2  18. Making sharp turns while running fast 2  19. Hopping  2  20. Rolling over in bed 2  Score total:  50/80     COGNITION: Overall cognitive status: Within functional limits for tasks assessed     SENSATION: WFL  EDEMA:  None  MUSCLE LENGTH: Hamstrings: WNL bilat  POSTURE: No Significant postural limitations  PALPATION: TTP R glute med  LOWER EXTREMITY ROM: R hip IR more limited than L   LOWER EXTREMITY MMT:  MMT Right eval Left eval  Hip flexion 3+ 3+  Hip extension 4 4  Hip abduction 3+ 3+  Hip adduction    Hip internal rotation 4- 5  Hip external rotation 3+ 4  Knee flexion 5 5  Knee extension 5 5  Ankle dorsiflexion    Ankle plantarflexion    Ankle inversion    Ankle eversion     (Blank rows = not tested)  LOWER EXTREMITY SPECIAL TESTS:  Hip special tests: Belvie (FABER) test: negative  FUNCTIONAL TESTS:  Did not assess  GAIT: Distance walked: Into clinic Assistive device utilized: None Level of assistance: Complete Independence Comments: Hip  instability/trendelenburg pattern notable                                                                                                                                TREATMENT DATE:   08/12/24  EXERCISE LOG    RT Hip  Exercise Repetitions and Resistance Comments  Nustep Lvl 3 x 15 mins   Rockerboard    Lunges    SKTC    LTR    Supine Ball Squeeze    Bridge with Ball    Supine Clam Shells    SL clamshell 3x10   SL reverse clamshl 3x10   SL Hip Abduction 3 sets of 10 reps     Blank cell = exercise not performed today   Manual Therapy Soft Tissue Mobilization: Right hip, STW/M to right hip musculature to decrease pain and tone with pt in left side-lying for comfort  Home TENS instruction and use x 10 mins to RT hip 07/31/24 See HEP below    PATIENT EDUCATION:  Education details: Exam findings, POC, initial HEP/options; discussed sleep positioning/using pillow between knees to decrease valgus stress on R lateral hip Person educated: Patient Education method: Explanation, Demonstration, and Handouts Education comprehension: verbalized understanding, returned demonstration, and needs further education  HOME EXERCISE PROGRAM: Access Code: [facility] URL: [email or web address] Date: 07/31/2024 Prepared by: Gellen April Earnie Starring  Exercises - Supine Piriformis Stretch with Foot on Ground  - 1 x daily - 7 x weekly - 2 sets - 30 sec hold - Supine Bilateral Hip Internal Rotation Stretch  - 1 x daily - 7 x weekly - 2 sets - 10 reps - Bent Knee Fallouts  - 1 x daily - 7 x weekly - 2 sets - 10 reps  ASSESSMENT:  CLINICAL IMPRESSION: Pt arrives for today's treatment session reporting 7/10 right hip pain. She was able to perform therex today for RT hip strengthening f/b STW to RT ITB and hip musculature. Home TENS instruction    OBJECTIVE IMPAIRMENTS: Abnormal gait, decreased endurance, decreased mobility, difficulty walking,  decreased strength, hypomobility, increased fascial restrictions, increased muscle spasms, improper body mechanics, postural dysfunction, and pain.   ACTIVITY LIMITATIONS: standing, sleeping, transfers, bed mobility, and locomotion level  PARTICIPATION LIMITATIONS: meal prep, cleaning, laundry, and community activity  PERSONAL FACTORS: Age, Fitness, Past/current experiences, and Time since onset of injury/illness/exacerbation are also affecting patient's functional outcome.   REHAB POTENTIAL: Good  CLINICAL DECISION MAKING: Evolving/moderate complexity  EVALUATION COMPLEXITY: Moderate   GOALS: Goals reviewed with patient? Yes  SHORT TERM GOALS: Target date: 08/28/2024  Pt will be ind with initial HEP Baseline: Goal status: INITIAL  2.  Pt will report improved pain by >/=25% Baseline:  Goal status: INITIAL   LONG TERM GOALS: Target date: 09/25/2024   Pt will be ind with management and progression of HEP Baseline:  Goal status: INITIAL  2.  Pt will have R = L hip IR/ER AROM to demo decreased hip muscle tightness Baseline:  Goal status: INITIAL  3.  Pt will demo at least 4/5 hip strength Baseline:  Goal status: INITIAL  4.  Pt will have increased LEFS to >=62/80 to demo MCID Baseline: 50 Goal status: INITIAL     PLAN:  PT FREQUENCY: 1-2x/week  PT DURATION: 8 weeks  PLANNED INTERVENTIONS: 97164- PT Re-evaluation, 97750- Physical Performance Testing, 97110-Therapeutic exercises, 97530- Therapeutic activity, 97112- Neuromuscular re-education, 97535- Self Care, 02859- Manual therapy, 97116- Gait training, [phone] (1-2 muscles), 20561 (3+ muscles)- Dry Needling, Patient/Family education, Balance training, Stair training, Taping, Joint mobilization, and Spinal mobilization  ** [employer] doesn't cover vaso, e-stim, U/S, traction **  PLAN FOR NEXT SESSION:  Manual therapy as indicated for glute med. Stretch hips/glutes. Strengthen hips/core.  Jhoel Stieg,CHRIS, PTA 08/12/2024, 9:04 AM

## 2024-08-18 ENCOUNTER — Ambulatory Visit

## 2024-08-18 DIAGNOSIS — M62838 Other muscle spasm: Secondary | ICD-10-CM

## 2024-08-18 DIAGNOSIS — M25651 Stiffness of right hip, not elsewhere classified: Secondary | ICD-10-CM

## 2024-08-18 DIAGNOSIS — M25551 Pain in right hip: Secondary | ICD-10-CM

## 2024-08-18 DIAGNOSIS — M6281 Muscle weakness (generalized): Secondary | ICD-10-CM

## 2024-08-18 NOTE — Therapy (Signed)
 OUTPATIENT PHYSICAL THERAPY LOWER EXTREMITY TREATMENT   Patient Name: Carlton Sweaney MRN: [id number] DOB:02/09/1966, 58 y.o., female Today's Date: 08/18/2024  END OF SESSION:  PT End of Session - 08/18/24 0849     Visit Number 5    Number of Visits 16    Date for Recertification  09/25/24    Authorization Type Medicaid    Authorization - Number of Visits 6    PT Start Time 0847    PT Stop Time 0931    PT Time Calculation (min) 44 min          Past Medical History:  Diagnosis Date   Anxiety    Arthritis    Chronic kidney disease    stage 3 per pt   Fatigue    Headache    History of kidney stones    Hypertension    Obesity 01/16/2022   takes metformin and ozempic for weight loss, not diabetic   Pre-diabetes    Sleep apnea    dx 15 years ago, never used CPAP   Past Surgical History:  Procedure Laterality Date   CHOLECYSTECTOMY     LAPAROSCOPIC HYSTERECTOMY     muiltiple knee arthroscopies bilateral      REVERSE SHOULDER ARTHROPLASTY Left 08/17/2022   Procedure: REVERSE SHOULDER ARTHROPLASTY;  Surgeon: Cristy Bonner DASEN, MD;  Location: [facility];  Service: Orthopedics;  Laterality: Left;   SHOULDER ARTHROSCOPY WITH DISTAL CLAVICLE RESECTION Left 01/26/2022   Procedure: SHOULDER ARTHROSCOPY WITH DISTAL CLAVICLE EXCISION/ DEBRIDEMENT;  Surgeon: Cristy Bonner DASEN, MD;  Location: [facility];  Service: Orthopedics;  Laterality: Left;   SHOULDER ARTHROSCOPY WITH SUBACROMIAL DECOMPRESSION, ROTATOR CUFF REPAIR AND BICEP TENDON REPAIR Left 01/26/2022   Procedure: SHOULDER ARTHROSCOPY WITH SUBACROMIAL DECOMPRESSION, ROTATOR CUFF REPAIR;  Surgeon: Cristy Bonner DASEN, MD;  Location: [facility];  Service: Orthopedics;  Laterality: Left;   TONSILLECTOMY     TOTAL KNEE ARTHROPLASTY Left 02/14/2023   Procedure: TOTAL KNEE ARTHROPLASTY;  Surgeon: Edna Toribio LABOR, MD;  Location: [facility];  Service: Orthopedics;  Laterality: Left;   TOTAL KNEE  ARTHROPLASTY Right 07/02/2023   Procedure: TOTAL KNEE ARTHROPLASTY;  Surgeon: Edna Toribio LABOR, MD;  Location: [facility];  Service: Orthopedics;  Laterality: Right;   TUBAL LIGATION     Patient Active Problem List   Diagnosis Date Noted   Chronic right shoulder pain 02/08/2023   Lumbar degenerative disc disease 11/30/2021   Status post total shoulder arthroplasty, left 03/14/2021   Primary osteoarthritis of both knees 03/14/2021    PCP: Teresa Aldona CROME, NP  REFERRING PROVIDER: Teresa Aldona CROME, NP  REFERRING DIAG: M25.551 (ICD-10-CM) - Greater trochanteric pain syndrome of right lower extremity M67.951 (ICD-10-CM) - Tendinopathy of right gluteus medius  THERAPY DIAG:  Pain in right hip  Stiffness of right hip, not elsewhere classified  Other muscle spasm  Muscle weakness (generalized)  Rationale for Evaluation and Treatment: Rehabilitation  ONSET DATE: 4-5 months  SUBJECTIVE:   SUBJECTIVE STATEMENT: Pt reports 4/10 right hip pain today.  Pt states that she has been using TENS unit with success.    PERTINENT HISTORY: Bilat knee replacement, rotator cuff, shoulder replacement, high blood pressure  PAIN:  Are you having pain? Yes: NPRS scale: 4/10 Pain location: R lateral hip Pain description: sometimes sharp; constant soreness/throbbing Aggravating factors: Getting in/out of bed Relieving factors: extra pain medicine  PRECAUTIONS: None  RED FLAGS: None   WEIGHT BEARING RESTRICTIONS: No  FALLS:  Has patient fallen  in last 6 months? 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  DIAGNOSTIC FINDINGS: No recent imaging seen for hip  PATIENT SURVEYS:  LEFS  Extreme difficulty/unable (0), Quite a bit of difficulty (1), Moderate difficulty (2), Little difficulty (3), No difficulty (4) Survey date:  10/23/252  Any of your usual work, housework or school activities 2  2. Usual hobbies, recreational or sporting activities 1  3. Getting into/out of the bath 3  4. Walking between rooms 2  5. Putting on socks/shoes 1  6. Squatting  2  7. Lifting an object, like a bag of groceries from the floor 3  8. Performing light activities around your home 3  9. Performing heavy activities around your home 3  10. Getting into/out of a car 4  11. Walking 2 blocks 3  12. Walking 1 mile 3  13. Going up/down 10 stairs (1 flight) 4  14. Standing for 1 hour 4  15.  sitting for 1 hour 2  16. Running on even ground 2  17. Running on uneven ground 2  18. Making sharp turns while running fast 2  19. Hopping  2  20. Rolling over in bed 2  Score total:  50/80     COGNITION: Overall cognitive status: Within functional limits for tasks assessed     SENSATION: WFL  EDEMA:  None  MUSCLE LENGTH: Hamstrings: WNL bilat  POSTURE: No Significant postural limitations  PALPATION: TTP R glute med  LOWER EXTREMITY ROM: R hip IR more limited than L   LOWER EXTREMITY MMT:  MMT Right eval Left eval  Hip flexion 3+ 3+  Hip extension 4 4  Hip abduction 3+ 3+  Hip adduction    Hip internal rotation 4- 5  Hip external rotation 3+ 4  Knee flexion 5 5  Knee extension 5 5  Ankle dorsiflexion    Ankle plantarflexion    Ankle inversion    Ankle eversion     (Blank rows = not tested)  LOWER EXTREMITY SPECIAL TESTS:  Hip special tests: Belvie (FABER) test: negative  FUNCTIONAL TESTS:  Did not assess  GAIT: Distance walked: Into clinic Assistive device utilized: None Level of assistance: Complete  Independence Comments: Hip instability/trendelenburg pattern notable                                                                                                                                TREATMENT DATE:   08/18/24  EXERCISE LOG    RT Hip  Exercise Repetitions and Resistance Comments  Nustep Lvl 3 x 15 mins   Rockerboard 5 mins   Lunges 14 box x 3 mins bil   Side-stepping Airex x 5 mins   Tandem Gait Airex x 5 mins   Standing Hip Abduction 2# x 25 reps bil   STS 15 reps no UE supper   SKTC    LTR    Supine Ball Squeeze    Bridge with Ball    Supine Clam Shells    SL clamshell    SL reverse clamshl    SL Hip Abduction     Blank cell = exercise not performed today   07/31/24 See HEP below    PATIENT EDUCATION:  Education details: Exam findings, POC, initial HEP/options; discussed sleep positioning/using pillow between knees to decrease valgus stress on R lateral hip Person educated: Patient Education method: Explanation, Demonstration, and Handouts Education comprehension: verbalized understanding, returned demonstration, and needs further education  HOME EXERCISE PROGRAM: Access Code: [facility] URL: [email or web address] Date: 07/31/2024 Prepared by: Gellen April Earnie Starring  Exercises - Supine Piriformis Stretch with Foot on Ground  - 1 x daily - 7 x weekly - 2 sets - 30 sec hold - Supine Bilateral Hip Internal Rotation Stretch  - 1 x daily - 7 x weekly - 2 sets - 10 reps - Bent Knee Fallouts  - 1 x daily - 7 x weekly - 2 sets - 10 reps  ASSESSMENT:  CLINICAL IMPRESSION: Pt arrives for today's treatment session reporting 4/10 right hip pain.  Pt states that she has an Alzheimer's walk this coming up Sunday and hopes that she is ready.  Pt introduced to Airex balance beam  activities today.  Pt requiring intermittent single UE support for safety and stability.  Pt able to perform all STS transfers without use of  UE support.  Pt reported decreased hip pain at completion of today's treatment session.  Pt has MD appointment tomorrow.  OBJECTIVE IMPAIRMENTS: Abnormal gait, decreased endurance, decreased mobility, difficulty walking, decreased strength, hypomobility, increased fascial restrictions, increased muscle spasms, improper body mechanics, postural dysfunction, and pain.   ACTIVITY LIMITATIONS: standing, sleeping, transfers, bed mobility, and locomotion level  PARTICIPATION LIMITATIONS: meal prep, cleaning, laundry, and community activity  PERSONAL FACTORS: Age, Fitness, Past/current experiences, and Time since onset of injury/illness/exacerbation are also affecting patient's functional outcome.   REHAB POTENTIAL: Good  CLINICAL DECISION MAKING: Evolving/moderate complexity  EVALUATION COMPLEXITY: Moderate   GOALS: Goals reviewed with patient? Yes  SHORT TERM GOALS: Target date: 08/28/2024  Pt will be ind with initial HEP Baseline: Goal status: INITIAL  2.  Pt will report improved pain by >/=25% Baseline:  Goal status: INITIAL   LONG TERM GOALS: Target date: 09/25/2024   Pt will be ind with management and progression of HEP Baseline:  Goal status: INITIAL  2.  Pt will have R = L hip IR/ER AROM to demo decreased hip muscle tightness Baseline:  Goal status: INITIAL  3.  Pt will demo at least 4/5 hip strength Baseline:  Goal status: INITIAL  4.  Pt will have increased LEFS to >=62/80 to demo MCID Baseline: 50 Goal status: INITIAL     PLAN:  PT FREQUENCY: 1-2x/week  PT DURATION: 8 weeks  PLANNED INTERVENTIONS: 97164- PT Re-evaluation, 97750- Physical Performance Testing, 97110-Therapeutic exercises, 97530- Therapeutic activity, [id number]- Neuromuscular re-education, 97535- Self Care, [id number]- Manual therapy, [id number]- Gait training, [phone] (1-2 muscles), [id number] (3+ muscles)- Dry Needling,  Patient/Family education, Balance training, Stair training, Taping, Joint mobilization,  and Spinal mobilization  ** [employer] doesn't cover vaso, e-stim, U/S, traction **  PLAN FOR NEXT SESSION:  Manual therapy as indicated for glute med. Stretch hips/glutes. Strengthen hips/core.    Delon DELENA Gosling, PTA 08/18/2024, 9:51 AM

## 2024-08-19 ENCOUNTER — Ambulatory Visit (INDEPENDENT_AMBULATORY_CARE_PROVIDER_SITE_OTHER)

## 2024-08-19 ENCOUNTER — Other Ambulatory Visit: Payer: Self-pay

## 2024-08-19 VITALS — Ht 61.0 in | Wt 195.0 lb

## 2024-08-19 DIAGNOSIS — M67951 Unspecified disorder of synovium and tendon, right thigh: Secondary | ICD-10-CM

## 2024-08-19 DIAGNOSIS — M25551 Pain in right hip: Secondary | ICD-10-CM

## 2024-08-19 NOTE — Progress Notes (Signed)
   Subjective:    Patient ID: Patricia Carrillo, female    DOB: 58 y.o., [birth year]   MRN: [id number]  Chief Complaint: Right greater trochanteric pain syndrome follow-up (4 weeks)  Discussed the use of AI scribe software for clinical note transcription with the patient, who gave verbal consent to proceed.  History of Present Illness Patient previously evaluated by myself on 07/23/2024 and diagnosed with greater trochanteric pain syndrome on the right side with gluteus medius tendinopathy.  She was provided with a pro bono extracorporeal shockwave therapy treatment at that time.  Since then she has been engaged with physical therapy but has not received subsequent treatments with shockwave.  Chronic hip pain - Chronic hip pain with 60% improvement over the past four weeks with physical therapy - Pain recurs after activities such as vacuuming or making a bed - Uses a TENS machine for pain management - Takes pain medication every six hours, with relief lasting approximately three hours  Functional status and activity tolerance - Attends physical therapy once weekly - Improved walking endurance; able to walk through a store without issues - Plans to participate in a walk for Alzheimer's on Sunday, with options to walk one or two miles - Uncertainty about completing the full distance due to prior bilateral knee replacements in the past year  Pain management modalities - Uses a TENS machine brought from home due to lack of insurance coverage for certain therapies such as dry needling       Objective:   There were no vitals filed for this visit.  Const: appears well, non-toxic, well groomed Psych: affect bright, interactive, smiling EENT: EOMI intact, conjunctiva appear normal Neck: no obvious masses, appears symmetric Resp: non-labored, appears symmetric Neuro: muscle bulk appears normal Skin: no obvious rashes noted  Limited ultrasound examination of the right hip: The gluteus  medius tendon is well-visualized and appears to have a heterogeneous echotexture with prominent hyper echogenic components near its tendinous insertion on the greater trochanter.  There are also punctate calcifications present near that distal insertion with some osteophytic change noted at the greater trochanter itself not contiguous with the gluteus medius tendon insertion. Interpretation: Calcific gluteus medius tendinopathy    Assessment & Plan:   Assessment & Plan Right greater trochanteric pain syndrome   Chronic inflammation of the right hip tendon is present without significant partial tears. Ultrasound indicates chronic tendinitis or tendinosis with small calcium deposits, reducing the likelihood of response to shockwave therapy. Mild arthritis and effusion in the hip joint are noted but do not alter the current treatment plan. Her strength has improved by 60% with physical therapy over four weeks. Scheduled prolotherapy injection for Thursday to promote healing, covered by insurance. Provided a handout for [facility] clinic for continued physical therapy support. Plan to use the longest acting anesthetic for the prolotherapy injection to aid in an upcoming walking event.

## 2024-08-21 ENCOUNTER — Ambulatory Visit

## 2024-08-21 ENCOUNTER — Other Ambulatory Visit: Payer: Self-pay

## 2024-08-21 VITALS — BP 120/82 | Ht 61.0 in | Wt 195.0 lb

## 2024-08-21 DIAGNOSIS — M25551 Pain in right hip: Secondary | ICD-10-CM | POA: Diagnosis not present

## 2024-08-21 DIAGNOSIS — M67951 Unspecified disorder of synovium and tendon, right thigh: Secondary | ICD-10-CM | POA: Diagnosis present

## 2024-08-21 MED ORDER — METHYLPREDNISOLONE ACETATE 40 MG/ML IJ SUSP
40.0000 mg | Freq: Once | INTRAMUSCULAR | Status: AC
  Administered 2024-08-21: 20 mg via INTRA_ARTICULAR

## 2024-08-21 NOTE — Addendum Note (Signed)
 Addended by: MARCINE HARLENE SAILOR on: 08/21/2024 01:36 PM   Modules accepted: Orders

## 2024-08-21 NOTE — Progress Notes (Signed)
   Subjective:    Patient ID: Patricia Carrillo, female    DOB: 58 y.o., [birth year]   MRN: [id number]  Chief Complaint: Right gluteus medius tendinopathy, prolotherapy injection  Discussed the use of AI scribe software for clinical note transcription with the patient, who gave verbal consent to proceed.  History of Present Illness   Patient reports symptoms unchanged from last visit Interested in proceeding with prolotherapy injection today. Hoping to do 1 or even 2 miles at a [facility] on Sunday     Objective:   There were no vitals filed for this visit.  Tenderness to palpation over th right e greater trochanter.  Right Gluteus Medius Prolotherapy Injection with Ultrasound Guidance Procedure Note [name] Dec 20, 1965 Indications: Pain Procedure Details Following the description of risks including infection, bleeding, and damage to surrounding structures patient provided written consent for Right gluteus medius prolotherapy injection with ultrasound guidance. Ultrasound was used to identify the gluteus medius tendon. The patient was then prepped in the usual fashion with chlorhexidine . Following topical anesthetization with ethyl chloride, the patient was injected into the right gluteus medius tendon with a solution of 25% dextrose , bupivacaine , sodium bicarb, and Depo-Medrol . This was well visualized under ultrasound, please see associated photographic documentation. Patient tolerated well without complication. Precautions provided. Cleaned and dressing applied.     Assessment & Plan:   Assessment & Plan Continued pain from right greater trochanteric pain syndrome likely related to gluteus medius tendinopathy.  Patient interested in prolotherapy injection.  Patient tolerated procedure well today.  Plan to follow-up in 2 weeks for planned repeat prolotherapy injection. Continue HEP and potentially [facility] pro bono PT clinic.

## 2024-08-25 ENCOUNTER — Ambulatory Visit

## 2024-08-25 DIAGNOSIS — M62838 Other muscle spasm: Secondary | ICD-10-CM

## 2024-08-25 DIAGNOSIS — M25651 Stiffness of right hip, not elsewhere classified: Secondary | ICD-10-CM

## 2024-08-25 DIAGNOSIS — M25551 Pain in right hip: Secondary | ICD-10-CM | POA: Diagnosis not present

## 2024-08-25 DIAGNOSIS — M6281 Muscle weakness (generalized): Secondary | ICD-10-CM

## 2024-08-25 NOTE — Therapy (Addendum)
 OUTPATIENT PHYSICAL THERAPY LOWER EXTREMITY TREATMENT AND DISCHARGE  PHYSICAL THERAPY DISCHARGE SUMMARY  Visits from Start of Care: 6  Current functional level related to goals / functional outcomes: See below   Remaining deficits: See below   Education / Equipment: Final HEP   Patient agrees to discharge. Patient goals were partially met. Patient is being discharged due to meeting the stated rehab goals.    Patient Name: Teka Chanda MRN: [id number] DOB:1965-12-26, 58 y.o., female Today's Date: 08/25/2024  END OF SESSION:  PT End of Session - 08/25/24 0804     Visit Number 6    Number of Visits 16    Date for Recertification  09/25/24    Authorization Type Medicaid    Authorization - Number of Visits 6    PT Start Time 0800    PT Stop Time 0844    PT Time Calculation (min) 44 min          Past Medical History:  Diagnosis Date   Anxiety    Arthritis    Chronic kidney disease    stage 3 per pt   Fatigue    Headache    History of kidney stones    Hypertension    Obesity 01/16/2022   takes metformin and ozempic for weight loss, not diabetic   Pre-diabetes    Sleep apnea    dx 15 years ago, never used CPAP   Past Surgical History:  Procedure Laterality Date   CHOLECYSTECTOMY     LAPAROSCOPIC HYSTERECTOMY     muiltiple knee arthroscopies bilateral      REVERSE SHOULDER ARTHROPLASTY Left 08/17/2022   Procedure: REVERSE SHOULDER ARTHROPLASTY;  Surgeon: Cristy Bonner DASEN, MD;  Location: [facility];  Service: Orthopedics;  Laterality: Left;   SHOULDER ARTHROSCOPY WITH DISTAL CLAVICLE RESECTION Left 01/26/2022   Procedure: SHOULDER ARTHROSCOPY WITH DISTAL CLAVICLE EXCISION/ DEBRIDEMENT;  Surgeon: Cristy Bonner DASEN, MD;  Location: [facility];  Service: Orthopedics;  Laterality: Left;   SHOULDER ARTHROSCOPY WITH SUBACROMIAL DECOMPRESSION, ROTATOR CUFF REPAIR AND BICEP TENDON REPAIR Left 01/26/2022   Procedure: SHOULDER ARTHROSCOPY WITH  SUBACROMIAL DECOMPRESSION, ROTATOR CUFF REPAIR;  Surgeon: Cristy Bonner DASEN, MD;  Location: [facility];  Service: Orthopedics;  Laterality: Left;   TONSILLECTOMY     TOTAL KNEE ARTHROPLASTY Left 02/14/2023   Procedure: TOTAL KNEE ARTHROPLASTY;  Surgeon: Edna Toribio LABOR, MD;  Location: [facility];  Service: Orthopedics;  Laterality: Left;   TOTAL KNEE ARTHROPLASTY Right 07/02/2023   Procedure: TOTAL KNEE ARTHROPLASTY;  Surgeon: Edna Toribio LABOR, MD;  Location: [facility];  Service: Orthopedics;  Laterality: Right;   TUBAL LIGATION     Patient Active Problem List   Diagnosis Date Noted   Chronic right shoulder pain 02/08/2023   Lumbar degenerative disc disease 11/30/2021   Status post total shoulder arthroplasty, left 03/14/2021   Primary osteoarthritis of both knees 03/14/2021    PCP: Teresa Aldona CROME, NP  REFERRING PROVIDER: Teresa Aldona CROME, NP  REFERRING DIAG: M25.551 (ICD-10-CM) - Greater trochanteric pain syndrome of right lower extremity M67.951 (ICD-10-CM) - Tendinopathy of right gluteus medius  THERAPY DIAG:  Pain in right hip  Stiffness of right hip, not elsewhere classified  Other muscle spasm  Muscle weakness (generalized)  Rationale for Evaluation and Treatment: Rehabilitation  ONSET DATE: 4-5 months  SUBJECTIVE:   SUBJECTIVE STATEMENT: Pt reports 6/10 right hip pain today.  Pt states that she got an injection on Thursday and her pain level has not changed.  PERTINENT HISTORY: Bilat knee replacement, rotator cuff, shoulder replacement, high blood pressure  PAIN:  Are you having pain? Yes: NPRS scale: 6/10 Pain location: R lateral hip Pain description: sometimes sharp; constant soreness/throbbing Aggravating factors: Getting in/out of bed Relieving factors: extra pain medicine  PRECAUTIONS: None  RED FLAGS: None   WEIGHT BEARING RESTRICTIONS: No  FALLS:  Has patient fallen in last 6 months? 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  DIAGNOSTIC FINDINGS: No recent imaging seen for hip  PATIENT SURVEYS:  LEFS  Extreme difficulty/unable (0), Quite a bit of difficulty (1), Moderate difficulty (2), Little difficulty (3), No difficulty (4) Survey date:  10/23/252  Any of your usual work, housework or school activities 2  2. Usual hobbies, recreational or sporting activities 1  3. Getting into/out of the bath 3  4. Walking between rooms 2  5. Putting on socks/shoes 1  6. Squatting  2  7. Lifting an object, like a bag of groceries from the floor 3  8. Performing light activities around your home 3  9. Performing heavy activities around your home 3  10. Getting into/out of a car 4  11. Walking 2 blocks 3  12. Walking 1 mile 3  13. Going up/down 10 stairs (1 flight) 4  14. Standing for 1 hour 4  15.  sitting for 1 hour 2  16. Running on even ground 2  17. Running on uneven ground 2  18. Making sharp turns while running fast 2  19. Hopping  2  20. Rolling over in bed 2  Score total:  50/80     COGNITION: Overall cognitive status: Within functional limits for tasks assessed     SENSATION: WFL  EDEMA:  None  MUSCLE LENGTH: Hamstrings: WNL bilat  POSTURE: No Significant postural limitations  PALPATION: TTP R glute med  LOWER EXTREMITY ROM: R hip IR more limited than L   LOWER EXTREMITY MMT:  MMT Right eval Left eval  Hip flexion 3+ 3+  Hip extension 4 4  Hip abduction 3+ 3+  Hip adduction    Hip internal rotation 4- 5  Hip external rotation 3+ 4   Knee flexion 5 5  Knee extension 5 5  Ankle dorsiflexion    Ankle plantarflexion    Ankle inversion    Ankle eversion     (Blank rows = not tested)  LOWER EXTREMITY SPECIAL TESTS:  Hip special tests: Belvie (FABER) test: negative  FUNCTIONAL TESTS:  Did not assess  GAIT: Distance walked: Into clinic Assistive device utilized: None Level of assistance: Complete Independence Comments: Hip instability/trendelenburg pattern notable  TREATMENT DATE:   08/25/24                                  EXERCISE LOG    RT Hip  Exercise Repetitions and Resistance Comments  Nustep Lvl 3 x 15 mins   Rockerboard    Lunges    Side-stepping    Tandem Gait    Standing Hip Abduction 2# x 25 reps bil   Standing Hip Extension 2# x 25 reps bil   Standing Hip Flexion 2# x 25 reps bil   STS    SKTC    LTR    Supine Ball Squeeze    Bridge with Ball    Supine Clam Shells    SL clamshell    SL reverse clamshl    SL Hip Abduction    Goal Assessment See Below    Blank cell = exercise not performed today   Manual Therapy RLE long axis distraction x 5 mins   07/31/24 See HEP below    PATIENT EDUCATION:  Education details: Exam findings, POC, initial HEP/options; discussed sleep positioning/using pillow between knees to decrease valgus stress on R lateral hip Person educated: Patient Education method: Explanation, Demonstration, and Handouts Education comprehension: verbalized understanding, returned demonstration, and needs further education  HOME EXERCISE PROGRAM: Access Code: [facility] URL: [email or web address] Date: 07/31/2024 Prepared by: Gellen April Earnie Starring  Exercises - Supine Piriformis Stretch with Foot on Ground  - 1 x daily - 7 x weekly - 2 sets - 30 sec hold - Supine Bilateral Hip Internal Rotation Stretch  - 1 x daily - 7 x  weekly - 2 sets - 10 reps - Bent Knee Fallouts  - 1 x daily - 7 x weekly - 2 sets - 10 reps  ASSESSMENT:  CLINICAL IMPRESSION: Pt arrives for today's treatment session reporting 6/10 right hip pain.  Pt states that she received an injection on Thursday and she has not been able to tell any difference in her right hip and back pain since then.  Pt has follow up with ortho in two weeks.  Manual long axis distraction performed to RLE today with some relief.  Pt able to tolerate increased reps with standing hip exercises today.  Pt able to demonstrate 4/5 global right hip strength as well as bil equal IR/ER of hips.  Pt able to increased LEFS score to 52/80, making some progress towards her goal.  Pt encouraged to call the facility with any questions or concerns.  Pt reported 4/10 right hip pain at completion of today's treatment session.  Pt ready for discharge at this time.   OBJECTIVE IMPAIRMENTS: Abnormal gait, decreased endurance, decreased mobility, difficulty walking, decreased strength, hypomobility, increased fascial restrictions, increased muscle spasms, improper body mechanics, postural dysfunction, and pain.   ACTIVITY LIMITATIONS: standing, sleeping, transfers, bed mobility, and locomotion level  PARTICIPATION LIMITATIONS: meal prep, cleaning, laundry, and community activity  PERSONAL FACTORS: Age, Fitness, Past/current experiences, and Time since onset of injury/illness/exacerbation are also affecting patient's functional outcome.   REHAB POTENTIAL: Good  CLINICAL DECISION MAKING: Evolving/moderate complexity  EVALUATION COMPLEXITY: Moderate   GOALS: Goals reviewed with patient? Yes  SHORT TERM GOALS: Target date: 08/28/2024  Pt will be ind with initial HEP Baseline: Goal status: MET  2.  Pt will report improved pain by >/=25% Baseline:  Goal status: MET   LONG TERM GOALS: Target date: 09/25/2024   Pt will be  ind with management and progression of HEP Baseline:   Goal status: MET  2.  Pt will have R = L hip IR/ER AROM to demo decreased hip muscle tightness Baseline:  Goal status: MET  3.  Pt will demo at least 4/5 hip strength Baseline: 11/17: 4/5  Goal status: MET  4.  Pt will have increased LEFS to >=62/80 to demo MCID Baseline: 50; 11/17: 52/80 Goal status: IN PROGRESS     PLAN:  PT FREQUENCY: 1-2x/week  PT DURATION: 8 weeks  PLANNED INTERVENTIONS: 02835- PT Re-evaluation, 97750- Physical Performance Testing, 97110-Therapeutic exercises, 97530- Therapeutic activity, 97112- Neuromuscular re-education, 97535- Self Care, 02859- Manual therapy, 97116- Gait training, [phone] (1-2 muscles), 20561 (3+ muscles)- Dry Needling, Patient/Family education, Balance training, Stair training, Taping, Joint mobilization, and Spinal mobilization  ** [employer] doesn't cover vaso, e-stim, U/S, traction **  PLAN FOR NEXT SESSION:  Manual therapy as indicated for glute med. Stretch hips/glutes. Strengthen hips/core.    Delon DELENA Gosling, PTA 08/25/2024, 11:12 AM  Gellen April Ma L Nonato, PT, DPT 08/25/2024, 12:28 PM

## 2024-09-07 NOTE — Progress Notes (Unsigned)
   Subjective:    Patient ID: Patricia Carrillo, female    DOB: 58 y.o., [birth year]   MRN: [id number]  Chief Complaint: Right greater trochanteric pain syndrome, chronic low back pain  History of Present Illness  Right greater trochanteric pain syndrome Patient reports little to no change after last visit. She was not able to get in with the [facility] pro bono physical therapy clinic because they stated she has insurance and this precludes her from being able to participate with them.  Chronic low back pain Patient reports this has been going on for several years but has worsened significantly recently Previously had some form of a spinal injection which caused a lot of pain at the time of the procedure which has led to her desire to avoid these for the foreseeable future Has had MRI distantly showing degenerative changes in her discs Denies fevers, chills, unexpected weight loss, objective lower extremity weakness  01/21/2022 lumbar spine: IMPRESSION: 1. Small left foraminal to extraforaminal disc protrusion at L4-5, contacting the exiting left L4 nerve root. 2. Degenerative disc osteophyte with facet hypertrophy at L5-S1 with resultant moderate left L5 foraminal stenosis. 3. Mild to moderate facet hypertrophy at L3-4 and L4-5, which could contribute to lower back pain.    Objective:   There were no vitals filed for this visit.  Right hip: Tenderness to palpation over the right greater trochanter.  Lumbar Spine -Inspection: no swelling or skin changes -Palpation: TTP + midline at L4-5, modest TTP in paraspinals -AROM/PROM: FROM in all planes of the low back -Strength: full hip flexion (L1/L2), knee extension (L3/4), ankle dorsiflexion (L4/5), hip extension (L5/S1), knee flexion (L5/S1/S2) plantarflexion (S1/2). -Sensation: intact sensation over the medial femoral condyle (L3), patella (L4), lateral femoral condyle (L5), lateral malleolus (S1). -Reflexes: normal  patellar (L3/4), hamstring (L5/S1), achilles (S1/2) reflexes, equal bilaterally -Special tests: - Straight Leg Raise, - Stork, - Slump test  Right Gluteus Medius Prolotherapy Injection with Ultrasound Guidance Procedure Note Patricia Carrillo Oct 13, 1965 Indications: Pain Procedure Details Following the description of risks including infection, bleeding, and damage to surrounding structures patient provided written consent for Right gluteus medius prolotherapy injection with ultrasound guidance. Ultrasound was used to identify the gluteus medius tendon. The patient was then prepped in the usual fashion with chlorhexidine . Following topical anesthetization with ethyl chloride, the patient was injected into the right gluteus medius tendon with a solution of 25% dextrose , bupivacaine , sodium bicarb, and Depo-Medrol . This was well visualized under ultrasound, please see associated photographic documentation. Patient tolerated well without complication. Precautions provided. Cleaned and dressing applied.     Assessment & Plan:   Assessment & Plan Patricia Carrillo's right sided greater trochanteric pain syndrome continues to be quite painful and bothersome.  We will pivot today to a steroid injection in evening to provide a more immediate pain relief for her preference.  With regard to her back, she does have midline tenderness at L4-L5, but otherwise no red flag symptoms/signs.  She has not had formal professionally directed physical or home therapy for this yet.  I have provided her with a series of exercises which I would like for her to use to target this area.  I have instructed her to participate in this assiduously for 6 weeks and to let me know how she is doing.

## 2024-09-08 ENCOUNTER — Other Ambulatory Visit: Payer: Self-pay

## 2024-09-08 ENCOUNTER — Ambulatory Visit

## 2024-09-08 VITALS — BP 121/78 | Ht 61.0 in | Wt 195.0 lb

## 2024-09-08 DIAGNOSIS — M67951 Unspecified disorder of synovium and tendon, right thigh: Secondary | ICD-10-CM | POA: Diagnosis present

## 2024-09-08 DIAGNOSIS — G8929 Other chronic pain: Secondary | ICD-10-CM

## 2024-09-08 DIAGNOSIS — M25551 Pain in right hip: Secondary | ICD-10-CM

## 2024-09-08 DIAGNOSIS — M5136 Other intervertebral disc degeneration, lumbar region with discogenic back pain only: Secondary | ICD-10-CM | POA: Diagnosis not present

## 2024-09-08 MED ORDER — METHYLPREDNISOLONE ACETATE 40 MG/ML IJ SUSP
40.0000 mg | Freq: Once | INTRAMUSCULAR | Status: AC
  Administered 2024-09-08: 40 mg via INTRA_ARTICULAR

## 2024-10-20 ENCOUNTER — Ambulatory Visit

## 2024-10-20 VITALS — Ht 61.0 in | Wt 200.0 lb

## 2024-10-20 DIAGNOSIS — M67951 Unspecified disorder of synovium and tendon, right thigh: Secondary | ICD-10-CM

## 2024-10-20 DIAGNOSIS — F419 Anxiety disorder, unspecified: Secondary | ICD-10-CM

## 2024-10-20 DIAGNOSIS — M25551 Pain in right hip: Secondary | ICD-10-CM

## 2024-10-20 MED ORDER — MELOXICAM 15 MG PO TABS: 15.0000 mg | ORAL_TABLET | Freq: Every day | ORAL | 1 refills | Status: DC

## 2024-10-20 MED ORDER — TRIAZOLAM 0.25 MG PO TABS: ORAL_TABLET | ORAL | 0 refills | Status: DC

## 2024-10-20 NOTE — Addendum Note (Signed)
 Addended by: Ajiah Mcglinn A on: 10/20/2024 01:49 PM   Modules accepted: Orders

## 2024-10-20 NOTE — Progress Notes (Signed)
" ° °  Subjective:    Patient ID: Patricia Carrillo, female    DOB: 59 y.o., 25-Oct-1965   MRN: [id number]  Chief Complaint: Right greater trochanteric pain syndrome, chronic low back pain  History of Present Illness Patricia Carrillo is a 59 year old female with chronic right gluteal tendinopathy and greater trochanteric pain syndrome who presents for evaluation of persistent right hip pain.  Right Lateral Hip Pain: - Severe, refractory pain localized to the right lateral hip - Minimal improvement over time despite interventions - Mild soreness present in the left hip and sacroiliac region - Pain restricts movements involving the right hip and prevents some exercises, such as pushups, though she can perform others - Over the past 6 weeks, daily home exercises have resulted in only about 5% improvement in right hip pain  Prior Interventions and Response: - Recent right hip steroid injection provided pain relief for only 1 to 2 days - Prior prolotherapy injections resulted in only modest benefit - Previously completed physical therapy for shoulder and back issues; currently performs home exercises for back and knees  Pain Management: - Uses gabapentin  800 mg four times daily for multi-joint pain, at maximum dose for several years - Uses oxycodone  for pain control - No recent use of anti-inflammatory medications  Imaging Tolerance: - Tolerates MRIs with pre-medication due to difficulty lying still - 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 Physical examination and transient response to corticosteroid injection suggest tendinopathy as the primary cause rather than isolated bursitis. Persistent severe symptoms despite conservative and interventional management indicate an advanced, resistant process. An MRI is needed to further characterize the degree of tendinosis and exclude occult tearing, which may inform future management. Gabapentin  has limited efficacy for this condition and carries risk of adverse effects, especially at higher doses. Anti-inflammatory therapy is appropriate given improved renal function but should be paused before imaging to avoid masking findings. Alternative interventions such as shockwave therapy and repeat prolotherapy remain options pending further imaging. Ordered a right hip MRI to assess the degree of tendinosis and exclude tearing, to be performed locally or in [place] per her preference and availability. Prescribed anti-inflammatory medication with a safer renal profile for pain control and to facilitate exercise participation; instructed her to pause anti-inflammatory therapy for two days prior to MRI. Initiated a gabapentin  taper regimen with instructions to monitor for withdrawal symptoms and adjust as needed. Advised continuation of home exercise therapy, emphasizing daily side-lying hip abduction targeting the gluteal tendons, supplemented by other tolerated exercises. Discussed alternative interventional options including shockwave therapy and prolotherapy, to be reconsidered after MRI results. No scheduled follow-up; plan to review MRI results and adjust management accordingly.    "

## 2024-10-22 ENCOUNTER — Telehealth: Payer: Self-pay | Admitting: *Deleted

## 2024-10-22 DIAGNOSIS — F419 Anxiety disorder, unspecified: Secondary | ICD-10-CM

## 2024-10-22 MED ORDER — DIAZEPAM 2 MG PO TABS: ORAL_TABLET | ORAL | 0 refills | Status: AC

## 2024-10-22 NOTE — Telephone Encounter (Signed)
 Halcion  0.25 mg tabs are not covered by pt's ins. It will cost her approx $25 OOP. Can you change rx? Thanks!

## 2024-10-27 ENCOUNTER — Ambulatory Visit (HOSPITAL_BASED_OUTPATIENT_CLINIC_OR_DEPARTMENT_OTHER)
Admission: RE | Admit: 2024-10-27 | Discharge: 2024-10-27 | Disposition: A | Discharge status: Home / Self Care | Source: Ambulatory Visit

## 2024-10-27 ENCOUNTER — Encounter (HOSPITAL_COMMUNITY): Payer: Self-pay

## 2024-10-27 ENCOUNTER — Ambulatory Visit (HOSPITAL_COMMUNITY)

## 2024-10-27 ENCOUNTER — Other Ambulatory Visit: Payer: Self-pay

## 2024-10-27 DIAGNOSIS — M67951 Unspecified disorder of synovium and tendon, right thigh: Secondary | ICD-10-CM | POA: Insufficient documentation

## 2024-10-28 ENCOUNTER — Ambulatory Visit: Payer: Self-pay

## 2024-11-03 ENCOUNTER — Ambulatory Visit (HOSPITAL_COMMUNITY)
Admission: RE | Admit: 2024-11-03 | Discharge: 2024-11-03 | Disposition: A | Discharge status: Home / Self Care | Source: Ambulatory Visit

## 2024-11-03 DIAGNOSIS — M67951 Unspecified disorder of synovium and tendon, right thigh: Secondary | ICD-10-CM | POA: Insufficient documentation

## 2024-11-03 DIAGNOSIS — M25551 Pain in right hip: Secondary | ICD-10-CM | POA: Diagnosis present

## 2024-11-07 ENCOUNTER — Ambulatory Visit: Payer: Self-pay

## 2024-11-07 ENCOUNTER — Other Ambulatory Visit: Payer: Self-pay

## 2024-11-07 DIAGNOSIS — M25551 Pain in right hip: Secondary | ICD-10-CM

## 2024-11-07 DIAGNOSIS — M67951 Unspecified disorder of synovium and tendon, right thigh: Secondary | ICD-10-CM

## 2024-11-19 ENCOUNTER — Ambulatory Visit
